# Patient Record
Sex: Female | Born: 1935 | Race: Black or African American | Hispanic: No | Smoking: Former smoker
Health system: Southern US, Community
[De-identification: ages and names within clinical notes are randomized; demographics above are authoritative.]

## PROBLEM LIST (undated history)

## (undated) DIAGNOSIS — D696 Thrombocytopenia, unspecified: Secondary | ICD-10-CM

## (undated) DIAGNOSIS — D693 Immune thrombocytopenic purpura: Secondary | ICD-10-CM

## (undated) DIAGNOSIS — E7800 Pure hypercholesterolemia, unspecified: Secondary | ICD-10-CM

## (undated) DIAGNOSIS — E079 Disorder of thyroid, unspecified: Secondary | ICD-10-CM

## (undated) DIAGNOSIS — D62 Acute posthemorrhagic anemia: Secondary | ICD-10-CM

## (undated) DIAGNOSIS — E876 Hypokalemia: Secondary | ICD-10-CM

## (undated) DIAGNOSIS — D72829 Elevated white blood cell count, unspecified: Secondary | ICD-10-CM

## (undated) DIAGNOSIS — M1990 Unspecified osteoarthritis, unspecified site: Secondary | ICD-10-CM

## (undated) DIAGNOSIS — I10 Essential (primary) hypertension: Secondary | ICD-10-CM

## (undated) DIAGNOSIS — C801 Malignant (primary) neoplasm, unspecified: Secondary | ICD-10-CM

## (undated) DIAGNOSIS — R509 Fever, unspecified: Secondary | ICD-10-CM

## (undated) DIAGNOSIS — D649 Anemia, unspecified: Secondary | ICD-10-CM

## (undated) DIAGNOSIS — F410 Panic disorder [episodic paroxysmal anxiety] without agoraphobia: Secondary | ICD-10-CM

## (undated) DIAGNOSIS — E039 Hypothyroidism, unspecified: Secondary | ICD-10-CM

## (undated) DIAGNOSIS — Z853 Personal history of malignant neoplasm of breast: Secondary | ICD-10-CM

## (undated) HISTORY — DX: Unspecified osteoarthritis, unspecified site: M19.90

## (undated) HISTORY — PX: JOINT REPLACEMENT: SHX530

## (undated) HISTORY — DX: Hypothyroidism, unspecified: E03.9

## (undated) HISTORY — DX: Hypokalemia: E87.6

## (undated) HISTORY — DX: Elevated white blood cell count, unspecified: D72.829

## (undated) HISTORY — DX: Immune thrombocytopenic purpura: D69.3

## (undated) HISTORY — DX: Acute posthemorrhagic anemia: D62

## (undated) HISTORY — PX: CHOLECYSTECTOMY: SHX55

## (undated) HISTORY — DX: Personal history of malignant neoplasm of breast: Z85.3

## (undated) HISTORY — PX: KNEE RECONSTRUCTION, MEDIAL PATELLAR FEMORAL LIGAMENT: SHX1898

## (undated) HISTORY — DX: Fever, unspecified: R50.9

## (undated) HISTORY — PX: OTHER SURGICAL HISTORY: SHX169

## (undated) HISTORY — PX: BREAST SURGERY: SHX581

## (undated) HISTORY — PX: BREAST LUMPECTOMY: SHX2

## (undated) HISTORY — DX: Pure hypercholesterolemia, unspecified: E78.00

---

## 1998-01-29 ENCOUNTER — Other Ambulatory Visit: Admission: RE | Admit: 1998-01-29 | Discharge: 1998-01-29 | Payer: Self-pay | Admitting: Family Medicine

## 1998-08-05 ENCOUNTER — Ambulatory Visit (HOSPITAL_COMMUNITY): Admission: RE | Admit: 1998-08-05 | Discharge: 1998-08-05 | Payer: Self-pay | Admitting: Family Medicine

## 1998-08-05 ENCOUNTER — Encounter: Payer: Self-pay | Admitting: Family Medicine

## 1998-08-17 ENCOUNTER — Inpatient Hospital Stay (HOSPITAL_COMMUNITY): Admission: EM | Admit: 1998-08-17 | Discharge: 1998-08-22 | Payer: Self-pay | Admitting: *Deleted

## 1998-08-17 ENCOUNTER — Encounter: Payer: Self-pay | Admitting: Emergency Medicine

## 1998-10-13 ENCOUNTER — Encounter: Payer: Self-pay | Admitting: Emergency Medicine

## 1998-10-14 ENCOUNTER — Inpatient Hospital Stay (HOSPITAL_COMMUNITY): Admission: EM | Admit: 1998-10-14 | Discharge: 1998-10-17 | Payer: Self-pay | Admitting: Emergency Medicine

## 1998-10-20 ENCOUNTER — Encounter: Admission: RE | Admit: 1998-10-20 | Discharge: 1999-01-18 | Payer: Self-pay | Admitting: Hematology & Oncology

## 1999-12-20 ENCOUNTER — Other Ambulatory Visit: Admission: RE | Admit: 1999-12-20 | Discharge: 1999-12-20 | Payer: Self-pay | Admitting: Family Medicine

## 2000-11-06 ENCOUNTER — Other Ambulatory Visit: Admission: RE | Admit: 2000-11-06 | Discharge: 2000-11-06 | Payer: Self-pay | Admitting: Family Medicine

## 2001-10-05 ENCOUNTER — Ambulatory Visit (HOSPITAL_COMMUNITY): Admission: RE | Admit: 2001-10-05 | Discharge: 2001-10-05 | Payer: Self-pay | Admitting: Internal Medicine

## 2001-10-05 ENCOUNTER — Encounter: Payer: Self-pay | Admitting: Internal Medicine

## 2001-10-19 ENCOUNTER — Encounter (HOSPITAL_BASED_OUTPATIENT_CLINIC_OR_DEPARTMENT_OTHER): Payer: Self-pay | Admitting: General Surgery

## 2001-10-23 ENCOUNTER — Inpatient Hospital Stay (HOSPITAL_COMMUNITY): Admission: RE | Admit: 2001-10-23 | Discharge: 2001-10-29 | Payer: Self-pay | Admitting: General Surgery

## 2001-10-23 ENCOUNTER — Encounter (INDEPENDENT_AMBULATORY_CARE_PROVIDER_SITE_OTHER): Payer: Self-pay | Admitting: Specialist

## 2003-04-07 ENCOUNTER — Other Ambulatory Visit: Admission: RE | Admit: 2003-04-07 | Discharge: 2003-04-07 | Payer: Self-pay | Admitting: Family Medicine

## 2003-11-05 ENCOUNTER — Encounter: Admission: RE | Admit: 2003-11-05 | Discharge: 2003-11-05 | Payer: Self-pay | Admitting: Family Medicine

## 2004-07-05 ENCOUNTER — Ambulatory Visit: Payer: Self-pay | Admitting: Family Medicine

## 2004-07-23 ENCOUNTER — Encounter (HOSPITAL_COMMUNITY): Admission: RE | Admit: 2004-07-23 | Discharge: 2004-10-21 | Payer: Self-pay | Admitting: General Surgery

## 2004-08-02 ENCOUNTER — Encounter: Admission: RE | Admit: 2004-08-02 | Discharge: 2004-08-02 | Payer: Self-pay | Admitting: General Surgery

## 2004-08-04 ENCOUNTER — Ambulatory Visit (HOSPITAL_COMMUNITY): Admission: RE | Admit: 2004-08-04 | Discharge: 2004-08-04 | Payer: Self-pay | Admitting: General Surgery

## 2004-08-04 ENCOUNTER — Ambulatory Visit (HOSPITAL_BASED_OUTPATIENT_CLINIC_OR_DEPARTMENT_OTHER): Admission: RE | Admit: 2004-08-04 | Discharge: 2004-08-04 | Payer: Self-pay | Admitting: General Surgery

## 2004-08-04 ENCOUNTER — Encounter (INDEPENDENT_AMBULATORY_CARE_PROVIDER_SITE_OTHER): Payer: Self-pay | Admitting: *Deleted

## 2004-09-08 ENCOUNTER — Ambulatory Visit: Payer: Self-pay | Admitting: Hematology & Oncology

## 2004-09-14 ENCOUNTER — Ambulatory Visit: Admission: RE | Admit: 2004-09-14 | Discharge: 2004-11-09 | Payer: Self-pay | Admitting: Radiation Oncology

## 2004-09-28 ENCOUNTER — Ambulatory Visit: Payer: Self-pay | Admitting: Family Medicine

## 2004-10-05 ENCOUNTER — Ambulatory Visit (HOSPITAL_COMMUNITY): Admission: RE | Admit: 2004-10-05 | Discharge: 2004-10-05 | Payer: Self-pay | Admitting: Family Medicine

## 2004-12-08 ENCOUNTER — Ambulatory Visit: Admission: RE | Admit: 2004-12-08 | Discharge: 2004-12-08 | Payer: Self-pay | Admitting: Radiation Oncology

## 2005-01-04 ENCOUNTER — Ambulatory Visit: Payer: Self-pay | Admitting: Hematology & Oncology

## 2005-04-06 ENCOUNTER — Ambulatory Visit: Payer: Self-pay | Admitting: Hematology & Oncology

## 2005-04-14 ENCOUNTER — Ambulatory Visit: Payer: Self-pay | Admitting: Family Medicine

## 2005-04-19 ENCOUNTER — Encounter: Admission: RE | Admit: 2005-04-19 | Discharge: 2005-04-19 | Payer: Self-pay | Admitting: Family Medicine

## 2005-05-20 ENCOUNTER — Ambulatory Visit: Payer: Self-pay | Admitting: Family Medicine

## 2005-06-13 ENCOUNTER — Ambulatory Visit: Payer: Self-pay | Admitting: Family Medicine

## 2005-07-06 ENCOUNTER — Ambulatory Visit: Payer: Self-pay | Admitting: Hematology & Oncology

## 2005-07-19 ENCOUNTER — Ambulatory Visit: Payer: Self-pay | Admitting: Family Medicine

## 2005-08-15 ENCOUNTER — Ambulatory Visit: Payer: Self-pay | Admitting: Family Medicine

## 2005-10-10 ENCOUNTER — Ambulatory Visit: Payer: Self-pay | Admitting: Family Medicine

## 2005-11-29 ENCOUNTER — Ambulatory Visit: Payer: Self-pay | Admitting: Family Medicine

## 2006-01-03 ENCOUNTER — Ambulatory Visit: Payer: Self-pay | Admitting: Hematology & Oncology

## 2006-01-05 LAB — COMPREHENSIVE METABOLIC PANEL WITH GFR
ALT: 11 U/L (ref 0–40)
AST: 25 U/L (ref 0–37)
Albumin: 3.9 g/dL (ref 3.5–5.2)
Alkaline Phosphatase: 50 U/L (ref 39–117)
BUN: 19 mg/dL (ref 6–23)
CO2: 30 meq/L (ref 19–32)
Calcium: 10.1 mg/dL (ref 8.4–10.5)
Chloride: 100 meq/L (ref 96–112)
Creatinine, Ser: 1.1 mg/dL (ref 0.4–1.2)
Glucose, Bld: 111 mg/dL — ABNORMAL HIGH (ref 70–99)
Potassium: 5.1 meq/L (ref 3.5–5.3)
Sodium: 141 meq/L (ref 135–145)
Total Bilirubin: 0.5 mg/dL (ref 0.3–1.2)
Total Protein: 7 g/dL (ref 6.0–8.3)

## 2006-01-05 LAB — CBC WITH DIFFERENTIAL/PLATELET
BASO%: 0.8 % (ref 0.0–2.0)
Basophils Absolute: 0 10*3/uL (ref 0.0–0.1)
EOS%: 13.7 % — ABNORMAL HIGH (ref 0.0–7.0)
HCT: 35.6 % (ref 34.8–46.6)
HGB: 11.9 g/dL (ref 11.6–15.9)
LYMPH%: 27.9 % (ref 14.0–48.0)
MCH: 31.3 pg (ref 26.0–34.0)
MCHC: 33.4 g/dL (ref 32.0–36.0)
MCV: 93.5 fL (ref 81.0–101.0)
MONO%: 6.1 % (ref 0.0–13.0)
NEUT%: 51.5 % (ref 39.6–76.8)
lymph#: 1.3 10*3/uL (ref 0.9–3.3)

## 2006-01-05 LAB — CEA: CEA: 1.5 ng/mL (ref 0.0–5.0)

## 2006-01-10 ENCOUNTER — Ambulatory Visit: Payer: Self-pay | Admitting: Family Medicine

## 2006-01-20 ENCOUNTER — Ambulatory Visit: Payer: Self-pay | Admitting: Family Medicine

## 2006-03-16 ENCOUNTER — Ambulatory Visit: Payer: Self-pay | Admitting: Family Medicine

## 2006-04-05 ENCOUNTER — Ambulatory Visit: Payer: Self-pay | Admitting: Nurse Practitioner

## 2006-06-09 ENCOUNTER — Ambulatory Visit: Payer: Self-pay | Admitting: Internal Medicine

## 2006-06-21 ENCOUNTER — Ambulatory Visit (HOSPITAL_COMMUNITY): Admission: RE | Admit: 2006-06-21 | Discharge: 2006-06-21 | Payer: Self-pay | Admitting: Family Medicine

## 2006-06-29 ENCOUNTER — Ambulatory Visit: Payer: Self-pay | Admitting: Family Medicine

## 2006-07-04 ENCOUNTER — Ambulatory Visit (HOSPITAL_COMMUNITY): Admission: RE | Admit: 2006-07-04 | Discharge: 2006-07-04 | Payer: Self-pay | Admitting: Family Medicine

## 2006-07-04 ENCOUNTER — Ambulatory Visit: Payer: Self-pay | Admitting: Hematology & Oncology

## 2006-07-06 LAB — CBC WITH DIFFERENTIAL/PLATELET
BASO%: 1.1 % (ref 0.0–2.0)
EOS%: 9.5 % — ABNORMAL HIGH (ref 0.0–7.0)
MCH: 31.7 pg (ref 26.0–34.0)
MCHC: 33.7 g/dL (ref 32.0–36.0)
MCV: 94.1 fL (ref 81.0–101.0)
MONO%: 5.5 % (ref 0.0–13.0)
NEUT%: 52.3 % (ref 39.6–76.8)
RDW: 13.3 % (ref 11.3–14.5)
lymph#: 1.6 10*3/uL (ref 0.9–3.3)

## 2006-07-06 LAB — COMPREHENSIVE METABOLIC PANEL
ALT: 17 U/L (ref 0–35)
AST: 21 U/L (ref 0–37)
Alkaline Phosphatase: 54 U/L (ref 39–117)
BUN: 16 mg/dL (ref 6–23)
Calcium: 10.6 mg/dL — ABNORMAL HIGH (ref 8.4–10.5)
Chloride: 99 mEq/L (ref 96–112)
Creatinine, Ser: 0.98 mg/dL (ref 0.40–1.20)
Potassium: 4.3 mEq/L (ref 3.5–5.3)

## 2006-08-02 ENCOUNTER — Ambulatory Visit: Payer: Self-pay | Admitting: Family Medicine

## 2006-08-09 ENCOUNTER — Ambulatory Visit: Payer: Self-pay | Admitting: Family Medicine

## 2006-08-17 ENCOUNTER — Ambulatory Visit: Payer: Self-pay | Admitting: Family Medicine

## 2006-09-12 ENCOUNTER — Ambulatory Visit: Payer: Self-pay | Admitting: Family Medicine

## 2006-10-03 ENCOUNTER — Ambulatory Visit: Payer: Self-pay | Admitting: Family Medicine

## 2006-10-25 ENCOUNTER — Ambulatory Visit: Payer: Self-pay | Admitting: Internal Medicine

## 2006-11-08 ENCOUNTER — Ambulatory Visit: Payer: Self-pay | Admitting: Internal Medicine

## 2006-12-21 ENCOUNTER — Ambulatory Visit: Payer: Self-pay | Admitting: Family Medicine

## 2006-12-28 ENCOUNTER — Ambulatory Visit: Payer: Self-pay | Admitting: Family Medicine

## 2006-12-28 ENCOUNTER — Encounter (INDEPENDENT_AMBULATORY_CARE_PROVIDER_SITE_OTHER): Payer: Self-pay | Admitting: Family Medicine

## 2006-12-28 ENCOUNTER — Other Ambulatory Visit: Admission: RE | Admit: 2006-12-28 | Discharge: 2006-12-28 | Payer: Self-pay | Admitting: Family Medicine

## 2006-12-28 LAB — CONVERTED CEMR LAB: Pap Smear: NORMAL

## 2007-01-02 ENCOUNTER — Ambulatory Visit: Payer: Self-pay | Admitting: Hematology & Oncology

## 2007-01-04 LAB — COMPREHENSIVE METABOLIC PANEL
ALT: 20 U/L (ref 0–35)
AST: 21 U/L (ref 0–37)
Albumin: 4.3 g/dL (ref 3.5–5.2)
Alkaline Phosphatase: 51 U/L (ref 39–117)
Glucose, Bld: 123 mg/dL — ABNORMAL HIGH (ref 70–99)
Potassium: 4.2 mEq/L (ref 3.5–5.3)
Sodium: 141 mEq/L (ref 135–145)
Total Protein: 7.2 g/dL (ref 6.0–8.3)

## 2007-01-04 LAB — CBC WITH DIFFERENTIAL/PLATELET
BASO%: 0.6 % (ref 0.0–2.0)
EOS%: 6 % (ref 0.0–7.0)
Eosinophils Absolute: 0.3 10*3/uL (ref 0.0–0.5)
MCHC: 34.6 g/dL (ref 32.0–36.0)
MCV: 92.4 fL (ref 81.0–101.0)
MONO%: 5.9 % (ref 0.0–13.0)
NEUT#: 2.5 10*3/uL (ref 1.5–6.5)
RBC: 3.66 10*6/uL — ABNORMAL LOW (ref 3.70–5.32)
RDW: 12.9 % (ref 11.3–14.5)

## 2007-01-09 ENCOUNTER — Ambulatory Visit: Payer: Self-pay | Admitting: Family Medicine

## 2007-01-23 ENCOUNTER — Ambulatory Visit: Payer: Self-pay | Admitting: Family Medicine

## 2007-02-02 ENCOUNTER — Ambulatory Visit: Payer: Self-pay | Admitting: Family Medicine

## 2007-02-07 ENCOUNTER — Encounter (INDEPENDENT_AMBULATORY_CARE_PROVIDER_SITE_OTHER): Payer: Self-pay | Admitting: Family Medicine

## 2007-02-07 DIAGNOSIS — E039 Hypothyroidism, unspecified: Secondary | ICD-10-CM | POA: Insufficient documentation

## 2007-02-07 DIAGNOSIS — C50919 Malignant neoplasm of unspecified site of unspecified female breast: Secondary | ICD-10-CM | POA: Insufficient documentation

## 2007-02-07 DIAGNOSIS — IMO0002 Reserved for concepts with insufficient information to code with codable children: Secondary | ICD-10-CM

## 2007-02-07 DIAGNOSIS — I10 Essential (primary) hypertension: Secondary | ICD-10-CM

## 2007-02-09 ENCOUNTER — Inpatient Hospital Stay (HOSPITAL_COMMUNITY): Admission: RE | Admit: 2007-02-09 | Discharge: 2007-02-12 | Payer: Self-pay | Admitting: Orthopedic Surgery

## 2007-03-07 ENCOUNTER — Encounter: Admission: RE | Admit: 2007-03-07 | Discharge: 2007-03-07 | Payer: Self-pay | Admitting: Orthopedic Surgery

## 2007-03-13 ENCOUNTER — Encounter: Admission: RE | Admit: 2007-03-13 | Discharge: 2007-05-11 | Payer: Self-pay | Admitting: Orthopedic Surgery

## 2007-04-11 ENCOUNTER — Ambulatory Visit: Payer: Self-pay | Admitting: Family Medicine

## 2007-04-25 ENCOUNTER — Ambulatory Visit: Payer: Self-pay | Admitting: Family Medicine

## 2007-05-02 ENCOUNTER — Encounter: Admission: RE | Admit: 2007-05-02 | Discharge: 2007-07-31 | Payer: Self-pay | Admitting: Family Medicine

## 2007-07-03 ENCOUNTER — Ambulatory Visit: Payer: Self-pay | Admitting: Hematology & Oncology

## 2007-07-05 LAB — CBC WITH DIFFERENTIAL/PLATELET
BASO%: 0.7 % (ref 0.0–2.0)
HCT: 34.7 % — ABNORMAL LOW (ref 34.8–46.6)
LYMPH%: 26 % (ref 14.0–48.0)
MCHC: 34 g/dL (ref 32.0–36.0)
MCV: 91.8 fL (ref 81.0–101.0)
MONO%: 5.4 % (ref 0.0–13.0)
NEUT%: 60.3 % (ref 39.6–76.8)
Platelets: 288 10*3/uL (ref 145–400)
RBC: 3.77 10*6/uL (ref 3.70–5.32)

## 2007-12-31 ENCOUNTER — Ambulatory Visit: Payer: Self-pay | Admitting: Hematology & Oncology

## 2008-01-01 ENCOUNTER — Encounter (INDEPENDENT_AMBULATORY_CARE_PROVIDER_SITE_OTHER): Payer: Self-pay | Admitting: Family Medicine

## 2008-01-01 ENCOUNTER — Ambulatory Visit: Payer: Self-pay | Admitting: Internal Medicine

## 2008-01-02 LAB — CBC WITH DIFFERENTIAL/PLATELET
BASO%: 0.7 % (ref 0.0–2.0)
EOS%: 5.9 % (ref 0.0–7.0)
HGB: 12.2 g/dL (ref 11.6–15.9)
MCH: 31 pg (ref 26.0–34.0)
MCHC: 33.6 g/dL (ref 32.0–36.0)
RBC: 3.93 10*6/uL (ref 3.70–5.32)
RDW: 12.9 % (ref 11.3–14.5)
lymph#: 1 10*3/uL (ref 0.9–3.3)

## 2008-01-02 LAB — COMPREHENSIVE METABOLIC PANEL
ALT: 16 U/L (ref 0–35)
AST: 21 U/L (ref 0–37)
Albumin: 4.5 g/dL (ref 3.5–5.2)
Alkaline Phosphatase: 61 U/L (ref 39–117)
Calcium: 10.4 mg/dL (ref 8.4–10.5)
Chloride: 101 mEq/L (ref 96–112)
Potassium: 4.1 mEq/L (ref 3.5–5.3)
Sodium: 142 mEq/L (ref 135–145)

## 2008-01-30 ENCOUNTER — Encounter (INDEPENDENT_AMBULATORY_CARE_PROVIDER_SITE_OTHER): Payer: Self-pay | Admitting: Family Medicine

## 2008-01-30 ENCOUNTER — Ambulatory Visit: Payer: Self-pay | Admitting: Internal Medicine

## 2008-01-30 LAB — CONVERTED CEMR LAB
AST: 19 units/L (ref 0–37)
Albumin: 4.4 g/dL (ref 3.5–5.2)
Alkaline Phosphatase: 56 units/L (ref 39–117)
Basophils Relative: 1 % (ref 0–1)
Eosinophils Absolute: 0.4 10*3/uL (ref 0.0–0.7)
Eosinophils Relative: 7 % — ABNORMAL HIGH (ref 0–5)
Glucose, Bld: 98 mg/dL (ref 70–99)
HCT: 37.5 % (ref 36.0–46.0)
HDL: 74 mg/dL (ref >39)
LDL Cholesterol: 66 mg/dL (ref 0–99)
Lymphs Abs: 2.1 10*3/uL (ref 0.7–4.0)
MCHC: 31.7 g/dL (ref 30.0–36.0)
MCV: 96.6 fL (ref 78.0–100.0)
Neutrophils Relative %: 50 % (ref 43–77)
Platelets: 287 10*3/uL (ref 150–400)
Potassium: 4.6 meq/L (ref 3.5–5.3)
RDW: 13.2 % (ref 11.5–15.5)
Sodium: 140 meq/L (ref 135–145)
TSH: 3.587 microintl units/mL (ref 0.350–5.50)
Total Bilirubin: 0.5 mg/dL (ref 0.3–1.2)
Total Protein: 7.5 g/dL (ref 6.0–8.3)
Triglycerides: 66 mg/dL (ref <150)
VLDL: 13 mg/dL (ref 0–40)
WBC: 5.7 10*3/uL (ref 4.0–10.5)

## 2008-02-07 ENCOUNTER — Ambulatory Visit: Payer: Self-pay | Admitting: Internal Medicine

## 2008-02-14 ENCOUNTER — Ambulatory Visit: Payer: Self-pay | Admitting: Internal Medicine

## 2008-02-19 ENCOUNTER — Ambulatory Visit: Payer: Self-pay | Admitting: Internal Medicine

## 2008-06-11 ENCOUNTER — Emergency Department (HOSPITAL_COMMUNITY): Admission: EM | Admit: 2008-06-11 | Discharge: 2008-06-11 | Payer: Self-pay | Admitting: Family Medicine

## 2008-06-17 ENCOUNTER — Ambulatory Visit: Payer: Self-pay | Admitting: Family Medicine

## 2008-07-03 ENCOUNTER — Ambulatory Visit: Payer: Self-pay | Admitting: Internal Medicine

## 2008-07-03 ENCOUNTER — Ambulatory Visit: Payer: Self-pay | Admitting: Hematology & Oncology

## 2008-07-04 LAB — CBC WITH DIFFERENTIAL (CANCER CENTER ONLY)
Eosinophils Absolute: 0.3 10*3/uL (ref 0.0–0.5)
HCT: 34 % — ABNORMAL LOW (ref 34.8–46.6)
LYMPH%: 28.6 % (ref 14.0–48.0)
MCV: 91 fL (ref 81–101)
MONO#: 0.3 10*3/uL (ref 0.1–0.9)
Platelets: 277 10*3/uL (ref 145–400)
RBC: 3.72 10*6/uL (ref 3.70–5.32)
WBC: 4.6 10*3/uL (ref 3.9–10.0)

## 2008-07-04 LAB — COMPREHENSIVE METABOLIC PANEL
AST: 20 U/L (ref 0–37)
Albumin: 4.3 g/dL (ref 3.5–5.2)
BUN: 18 mg/dL (ref 6–23)
Calcium: 10.6 mg/dL — ABNORMAL HIGH (ref 8.4–10.5)
Chloride: 99 mEq/L (ref 96–112)
Potassium: 4.5 mEq/L (ref 3.5–5.3)
Total Protein: 7.3 g/dL (ref 6.0–8.3)

## 2008-10-28 ENCOUNTER — Ambulatory Visit: Payer: Self-pay | Admitting: Internal Medicine

## 2008-10-28 ENCOUNTER — Encounter (INDEPENDENT_AMBULATORY_CARE_PROVIDER_SITE_OTHER): Payer: Self-pay | Admitting: Family Medicine

## 2008-10-28 LAB — CONVERTED CEMR LAB
BUN: 15 mg/dL (ref 6–23)
Creatinine, Ser: 1.1 mg/dL (ref 0.40–1.20)

## 2008-10-30 ENCOUNTER — Ambulatory Visit (HOSPITAL_COMMUNITY): Admission: RE | Admit: 2008-10-30 | Discharge: 2008-10-30 | Payer: Self-pay | Admitting: Dentistry

## 2008-11-27 ENCOUNTER — Ambulatory Visit: Payer: Self-pay | Admitting: Family Medicine

## 2009-02-05 ENCOUNTER — Ambulatory Visit: Payer: Self-pay | Admitting: Family Medicine

## 2009-02-05 LAB — CONVERTED CEMR LAB
Chloride: 101 meq/L (ref 96–112)
Cholesterol: 142 mg/dL (ref 0–200)
HDL: 66 mg/dL (ref >39)
LDL Cholesterol: 63 mg/dL (ref 0–99)
Potassium: 4.5 meq/L (ref 3.5–5.3)
Sodium: 142 meq/L (ref 135–145)
Total CHOL/HDL Ratio: 2.2
Triglycerides: 66 mg/dL (ref <150)
VLDL: 13 mg/dL (ref 0–40)

## 2009-02-10 ENCOUNTER — Ambulatory Visit: Payer: Self-pay | Admitting: Hematology & Oncology

## 2009-02-11 LAB — CBC WITH DIFFERENTIAL (CANCER CENTER ONLY)
Eosinophils Absolute: 0.3 10*3/uL (ref 0.0–0.5)
LYMPH#: 1.9 10*3/uL (ref 0.9–3.3)
MONO#: 0.2 10*3/uL (ref 0.1–0.9)
MONO%: 4.8 % (ref 0.0–13.0)
NEUT#: 2.5 10*3/uL (ref 1.5–6.5)
Platelets: 232 10*3/uL (ref 145–400)
RBC: 3.2 10*6/uL — ABNORMAL LOW (ref 3.70–5.32)
WBC: 4.9 10*3/uL (ref 3.9–10.0)

## 2009-02-11 LAB — CHCC SATELLITE - SMEAR

## 2009-02-12 LAB — COMPREHENSIVE METABOLIC PANEL
ALT: 15 U/L (ref 0–35)
AST: 20 U/L (ref 0–37)
Albumin: 4.4 g/dL (ref 3.5–5.2)
Calcium: 10.3 mg/dL (ref 8.4–10.5)
Chloride: 98 mEq/L (ref 96–112)
Creatinine, Ser: 1.3 mg/dL — ABNORMAL HIGH (ref 0.40–1.20)
Potassium: 4.5 mEq/L (ref 3.5–5.3)

## 2009-02-12 LAB — ERYTHROPOIETIN: Erythropoietin: 16.8 m[IU]/mL (ref 2.6–34.0)

## 2009-03-27 ENCOUNTER — Ambulatory Visit: Payer: Self-pay | Admitting: Hematology & Oncology

## 2009-03-30 LAB — CBC WITH DIFFERENTIAL (CANCER CENTER ONLY)
BASO%: 0.5 % (ref 0.0–2.0)
EOS%: 4.9 % (ref 0.0–7.0)
HCT: 34.5 % — ABNORMAL LOW (ref 34.8–46.6)
LYMPH#: 1.7 10*3/uL (ref 0.9–3.3)
MCHC: 32.3 g/dL (ref 32.0–36.0)
MONO#: 0.3 10*3/uL (ref 0.1–0.9)
NEUT#: 2.5 10*3/uL (ref 1.5–6.5)
NEUT%: 53.2 % (ref 39.6–80.0)
RDW: 10.6 % (ref 10.5–14.6)
WBC: 4.7 10*3/uL (ref 3.9–10.0)

## 2009-03-30 LAB — CHCC SATELLITE - SMEAR

## 2009-04-01 LAB — SPEP & IFE WITH QIG
Albumin ELP: 57.9 % (ref 55.8–66.1)
Alpha-1-Globulin: 5.3 % — ABNORMAL HIGH (ref 2.9–4.9)
IgM, Serum: 133 mg/dL (ref 60–263)
Total Protein, Serum Electrophoresis: 6.8 g/dL (ref 6.0–8.3)

## 2009-04-01 LAB — RETICULOCYTES (CHCC)
ABS Retic: 63.8 10*3/uL (ref 19.0–186.0)
RBC.: 3.75 MIL/uL — ABNORMAL LOW (ref 3.87–5.11)

## 2009-06-25 ENCOUNTER — Ambulatory Visit: Payer: Self-pay | Admitting: Hematology & Oncology

## 2009-06-29 LAB — CBC WITH DIFFERENTIAL (CANCER CENTER ONLY)
BASO%: 0.4 % (ref 0.0–2.0)
EOS%: 5.4 % (ref 0.0–7.0)
LYMPH#: 1.8 10*3/uL (ref 0.9–3.3)
LYMPH%: 36.3 % (ref 14.0–48.0)
MCHC: 34 g/dL (ref 32.0–36.0)
MCV: 91 fL (ref 81–101)
MONO#: 0.2 10*3/uL (ref 0.1–0.9)
NEUT%: 53.3 % (ref 39.6–80.0)
Platelets: 243 10*3/uL (ref 145–400)
RDW: 11.2 % (ref 10.5–14.6)
WBC: 4.9 10*3/uL (ref 3.9–10.0)

## 2009-06-29 LAB — CHCC SATELLITE - SMEAR

## 2009-07-09 ENCOUNTER — Ambulatory Visit: Payer: Self-pay | Admitting: Family Medicine

## 2009-09-25 ENCOUNTER — Emergency Department (HOSPITAL_COMMUNITY): Admission: EM | Admit: 2009-09-25 | Discharge: 2009-09-25 | Payer: Self-pay | Admitting: Emergency Medicine

## 2009-09-25 ENCOUNTER — Ambulatory Visit: Payer: Self-pay | Admitting: Hematology & Oncology

## 2009-10-07 ENCOUNTER — Emergency Department (HOSPITAL_COMMUNITY): Admission: EM | Admit: 2009-10-07 | Discharge: 2009-10-07 | Payer: Self-pay | Admitting: Emergency Medicine

## 2009-10-13 ENCOUNTER — Ambulatory Visit: Payer: Self-pay | Admitting: Family Medicine

## 2009-10-13 LAB — CONVERTED CEMR LAB
BUN: 15 mg/dL (ref 6–23)
CO2: 26 meq/L (ref 19–32)
Calcium: 10.6 mg/dL — ABNORMAL HIGH (ref 8.4–10.5)
Creatinine, Ser: 0.97 mg/dL (ref 0.40–1.20)
Free T4: 1.35 ng/dL (ref 0.80–1.80)
Glucose, Bld: 93 mg/dL (ref 70–99)
TSH: 0.853 microintl units/mL (ref 0.350–4.500)
Total CHOL/HDL Ratio: 2.5

## 2009-11-24 ENCOUNTER — Ambulatory Visit: Payer: Self-pay | Admitting: Family Medicine

## 2010-01-06 ENCOUNTER — Ambulatory Visit: Payer: Self-pay | Admitting: Hematology & Oncology

## 2010-01-07 LAB — CBC WITH DIFFERENTIAL (CANCER CENTER ONLY)
BASO%: 0.5 % (ref 0.0–2.0)
HCT: 36.9 % (ref 34.8–46.6)
LYMPH#: 1.5 10*3/uL (ref 0.9–3.3)
MONO#: 0.2 10*3/uL (ref 0.1–0.9)
Platelets: 272 10*3/uL (ref 145–400)
RDW: 11.2 % (ref 10.5–14.6)
WBC: 4.3 10*3/uL (ref 3.9–10.0)

## 2010-01-07 LAB — COMPREHENSIVE METABOLIC PANEL
ALT: 21 U/L (ref 0–35)
CO2: 28 mEq/L (ref 19–32)
Calcium: 10.5 mg/dL (ref 8.4–10.5)
Chloride: 99 mEq/L (ref 96–112)
Sodium: 141 mEq/L (ref 135–145)
Total Protein: 7.2 g/dL (ref 6.0–8.3)

## 2010-01-07 LAB — RETICULOCYTES (CHCC): Retic Ct Pct: 1.9 % (ref 0.4–3.1)

## 2010-01-07 LAB — FERRITIN: Ferritin: 80 ng/mL (ref 10–291)

## 2010-01-12 ENCOUNTER — Ambulatory Visit: Payer: Self-pay | Admitting: Family Medicine

## 2010-02-18 ENCOUNTER — Ambulatory Visit: Payer: Self-pay | Admitting: Family Medicine

## 2010-03-05 ENCOUNTER — Ambulatory Visit: Payer: Self-pay | Admitting: Internal Medicine

## 2010-06-08 ENCOUNTER — Encounter (INDEPENDENT_AMBULATORY_CARE_PROVIDER_SITE_OTHER): Payer: Self-pay | Admitting: Family Medicine

## 2010-06-29 ENCOUNTER — Encounter (INDEPENDENT_AMBULATORY_CARE_PROVIDER_SITE_OTHER): Payer: Self-pay | Admitting: Family Medicine

## 2010-06-29 LAB — CONVERTED CEMR LAB
ALT: 16 units/L (ref 0–35)
AST: 23 units/L (ref 0–37)
Calcium: 10.4 mg/dL (ref 8.4–10.5)
Chloride: 96 meq/L (ref 96–112)
Creatinine, Ser: 1.19 mg/dL (ref 0.40–1.20)
Microalb, Ur: 1.96 mg/dL — ABNORMAL HIGH (ref 0.00–1.89)
Potassium: 4.2 meq/L (ref 3.5–5.3)
Sodium: 141 meq/L (ref 135–145)
Total CHOL/HDL Ratio: 3
Total Protein: 7.6 g/dL (ref 6.0–8.3)

## 2010-07-02 ENCOUNTER — Ambulatory Visit (HOSPITAL_COMMUNITY): Admission: RE | Admit: 2010-07-02 | Discharge: 2010-07-02 | Payer: Self-pay | Admitting: Family Medicine

## 2010-07-06 ENCOUNTER — Ambulatory Visit: Payer: Self-pay | Admitting: Hematology & Oncology

## 2010-07-09 LAB — CBC WITH DIFFERENTIAL (CANCER CENTER ONLY)
Eosinophils Absolute: 0.3 10*3/uL (ref 0.0–0.5)
LYMPH#: 2 10*3/uL (ref 0.9–3.3)
MONO#: 0.3 10*3/uL (ref 0.1–0.9)
NEUT#: 2.3 10*3/uL (ref 1.5–6.5)
Platelets: 280 10*3/uL (ref 145–400)
RBC: 4.01 10*6/uL (ref 3.70–5.32)
WBC: 4.8 10*3/uL (ref 3.9–10.0)

## 2010-07-09 LAB — CHCC SATELLITE - SMEAR

## 2010-07-10 LAB — COMPREHENSIVE METABOLIC PANEL
Albumin: 4.5 g/dL (ref 3.5–5.2)
Alkaline Phosphatase: 55 U/L (ref 39–117)
BUN: 31 mg/dL — ABNORMAL HIGH (ref 6–23)
CO2: 33 mEq/L — ABNORMAL HIGH (ref 19–32)
Chloride: 98 mEq/L (ref 96–112)
Creatinine, Ser: 1.35 mg/dL — ABNORMAL HIGH (ref 0.40–1.20)
Sodium: 140 mEq/L (ref 135–145)
Total Bilirubin: 0.4 mg/dL (ref 0.3–1.2)

## 2010-07-10 LAB — VITAMIN D 25 HYDROXY (VIT D DEFICIENCY, FRACTURES): Vit D, 25-Hydroxy: 38 ng/mL (ref 30–89)

## 2010-09-18 ENCOUNTER — Emergency Department (HOSPITAL_COMMUNITY)
Admission: EM | Admit: 2010-09-18 | Discharge: 2010-09-18 | Payer: Self-pay | Source: Home / Self Care | Admitting: Emergency Medicine

## 2010-09-21 LAB — URINALYSIS, ROUTINE W REFLEX MICROSCOPIC
Bilirubin Urine: NEGATIVE
Specific Gravity, Urine: 1.009 (ref 1.005–1.030)
Urine Glucose, Fasting: NEGATIVE mg/dL
Urobilinogen, UA: 0.2 mg/dL (ref 0.0–1.0)
pH: 7 (ref 5.0–8.0)

## 2010-09-21 LAB — URINE MICROSCOPIC-ADD ON

## 2010-11-01 ENCOUNTER — Encounter (INDEPENDENT_AMBULATORY_CARE_PROVIDER_SITE_OTHER): Payer: Self-pay | Admitting: Family Medicine

## 2010-11-01 LAB — CONVERTED CEMR LAB
AST: 21 units/L (ref 0–37)
Albumin: 4.6 g/dL (ref 3.5–5.2)
BUN: 20 mg/dL (ref 6–23)
CO2: 31 meq/L (ref 19–32)
Calcium: 10.2 mg/dL (ref 8.4–10.5)
Chloride: 100 meq/L (ref 96–112)
Cholesterol: 209 mg/dL — ABNORMAL HIGH (ref 0–200)
HDL: 65 mg/dL (ref >39)
Potassium: 4.4 meq/L (ref 3.5–5.3)

## 2010-11-14 LAB — BASIC METABOLIC PANEL
CO2: 32 mEq/L (ref 19–32)
Chloride: 100 mEq/L (ref 96–112)
Creatinine, Ser: 1.29 mg/dL — ABNORMAL HIGH (ref 0.4–1.2)
GFR calc Af Amer: 49 mL/min — ABNORMAL LOW (ref >60)
Sodium: 141 mEq/L (ref 135–145)

## 2010-11-14 LAB — URINALYSIS, ROUTINE W REFLEX MICROSCOPIC
Glucose, UA: NEGATIVE mg/dL
Ketones, ur: NEGATIVE mg/dL
Protein, ur: 30 mg/dL — AB
Urobilinogen, UA: 0.2 mg/dL (ref 0.0–1.0)

## 2010-11-14 LAB — DIFFERENTIAL
Basophils Absolute: 0 10*3/uL (ref 0.0–0.1)
Basophils Relative: 1 % (ref 0–1)
Eosinophils Absolute: 0.2 10*3/uL (ref 0.0–0.7)
Eosinophils Relative: 4 % (ref 0–5)
Lymphs Abs: 1.4 10*3/uL (ref 0.7–4.0)

## 2010-11-14 LAB — CBC
Hemoglobin: 11.5 g/dL — ABNORMAL LOW (ref 12.0–15.0)
MCHC: 33 g/dL (ref 30.0–36.0)
MCV: 95.7 fL (ref 78.0–100.0)
RBC: 3.64 MIL/uL — ABNORMAL LOW (ref 3.87–5.11)

## 2010-11-14 LAB — URINE MICROSCOPIC-ADD ON

## 2011-01-11 NOTE — Discharge Summary (Signed)
NAMEWALDA, HERTZOG               ACCOUNT NO.:  [id number]   MEDICAL RECORD NO.:  [id number]          PATIENT TYPE:  INP   LOCATION:  5003                         FACILITY:  [facility]   PHYSICIAN:  Dyke Brackett, M.D.    DATE OF BIRTH:  [birth year]   DATE OF ADMISSION:  02/09/2007  DATE OF DISCHARGE:  02/12/2007                               DISCHARGE SUMMARY   ADMITTING DIAGNOSES:  1. Osteoarthritis of left knee.  2. Hypertension.  3. Hypothyroidism.  4. Hypercholesterolemia.  5. A history of breast cancer.  6. Allergies.  7. A questionable history of idiopathic thrombocytopenic purpura.   DISCHARGE DIAGNOSES:  1. Status post a left total knee arthroplasty.  2. Acute blood loss anemia secondary to surgery, requiring blood      transfusion.  3. Hypokalemia, treated.  4. Leukocytosis/pyrexia.  5. Hypertension.  6. Hypothyroidism.  7. Hypercholesterolemia.  8. A history of breast cancer.  9. Allergies.  10.A questionable history of idiopathic thrombocytopenic purpura.   HPI:  Lisa Sawyer is a 77 year old female who presents with a 5-year  history of gradual progressive onset of left knee pain.  No injury, no  surgical intervention.  Pain is intermittent, a sharp achy pain in her  joint with radiation into tibia.  Pain increases with weather and  decreases with sitting.  Tylenol Extra Strength provides moderate  relief.  She does have night pain.  Cortisone injections have offered  relief in the past.  She has used a four-prong cane for the past 6  months.   ALLERGIES:  NAPROXEN.   MEDS:  1. Lisinopril/HCTZ 20/25 one daily.  2. Lipitor 20 mg 1 daily.  3. Synthroid 0.1 mg daily.  4. Climara 2.5 mg 1 daily.  5. Clarinex 5 mg daily.  6. Nasonex 50 mcg q.h.s.  7. Fish oil 1200 mg 1 daily.  8. Aspirin 81 mg 1 daily, last taken January 31, 2007.  9. Caltrate +D, magnesium 2 daily.  10.Tylenol p.r.n.  11.Centrum Silver 1 daily.  12.Amlodipine 5 mg 1 daily.   SURGICAL PROCEDURE:   Patient was taken to the operating room on February 09, 2007 by Dr. Frederico Hamman assisted by Richardean Canal, PA-C.  The patient  was placed under general anesthesia and underwent a left total knee PC3.  Patient did have supplemental femoral nerve block.   Following components were used:  1. An Oval Dome 3-Peg patella.  2. A TC3 insert, size 3, 17.5 mm.  3. Femoral component PC3, 6 mL/61 mm anterior and posterior.  4. Universal fluted stems at 14 mm.  5. Revision symmetry , size 3.  6. TC3 insert, size 3, 17.5 mm.   Patient tolerated procedure well and returned to recovery in good/stable  condition.   HOSPITAL CONSULTS:  PT/OT, case management.   HOSPITAL COURSE:  Postop day 1:  The patient afebrile, blood pressure  160/74, O2 saturations 100% on nasal cannula.  H&H was 8.2 and 24.7.  Potassium was 3.2.  The patient was transfused 2 units of packed red  blood cells, TEDs were placed.  Patient denied shortness  of breath,  chest pain, no nausea.   Postop day 2:  The patient with nausea, no chest pain or shortness of  breath.  Pain under control with weakness.  T max was 102.7, peak  current was 98.8 at rounds.  Her blood pressure 143/77.  Labs was  pending at the time of rounds.  Patient did not receive her blood  secondary to fever and, therefore, was getting packed red blood cells on  June 15 on postop day 2.  Labs to be checked later that day.   Postop day 3:  The patient doing well, good pain control, some weakness  with physical therapy.  No chest pain, shortness of breath, nausea,  vomiting.  Denied dysuria.  No bowel movement, positive voiding,  positive flatus.  Temp was 99.2, vital signs stable.  H&H was 12.5 and  37.8.  Lowden count was 14.3, and this was up to 12.1 the day prior.  On  June 15, postop day 2, potassium was 7.1.  Patient was to work with  physical therapy, and if she had done well, she was to be discharged.  CBC was to be checked on June 18 for Carollo count  impression related to  blood transfusion.  The patient did not have a bowel movement.  The  patient afebrile.  Chest x-ray and UA had been checked on June 14 and  were  negative.  The patient was later discharged to home that day in  good/stable condition.   LABS:  Routine labs on admission:  CBC:  All values within normal  limits.  Coags were within normal limits, and her routine chemistries  were within normal limits.  Urinalysis was negative on admission.  Hepatic enzymes were within normal limits on admission.   Urine culture on February 06, 2007 was negative.   X-rays left knee, portable, performed on February 09, 2007 showed left total  knee replacement without complicated features.   Portable chest x-ray on February 10, 2007 showed hyperinflation, borderline  heart size with no active disease.   Left knee portable performed on February 09, 2007 showed total knee  replacement, drain removed, no retained foreign body.   DISCHARGE INSTRUCTIONS/MEDS:  1. Lovenox 40 mg 1 injection daily in the a.m.  2. No aspirin while on Lovenox.  3. Percocet 5/325 one to two tablets every 4 to 6 hours as needed.  4. Patient is to resume aspirin on February 24, 2007 at 81 mg once daily.  5. Patient resume meds per med sheet.  6. Stool softener/laxative of choice as needed.  7. Diet:  No restrictions.  8. Wound care:  The patient is to keep wound clean, dry, and change      dressing daily, call with signs of infection.  9. May shower in 2 days if no drainage.  10.Weightbearing:  The patient is to partial weight bear and 50% left      leg.  11.Follow up with Dr. Madelon Lips 11 days from discharge, call [phone]      for appointment.  12.Home health PT per Genevieve Norlander.  13.Special instructions:  Continue to check CBC on February 14, 2007, call      results in to [phone].   CONDITION ON DISCHARGE:  The patient was discharged to home in  good/stable condition.      Richardean Canal, Arnetha Courser, M.D.   Electronically Signed    GC/MEDQ  D:  04/05/2007  T:  04/06/2007  Job:  [id number]

## 2011-01-11 NOTE — Op Note (Signed)
NAMEAUDELIA, KNAPE NO.:  [id number]   MEDICAL RECORD NO.:  [id number]          PATIENT TYPE:  INP   LOCATION:  2899                         FACILITY:  [facility]   PHYSICIAN:  Dyke Brackett, M.D.    DATE OF BIRTH:  [birth year]   DATE OF PROCEDURE:  02/09/2007  DATE OF DISCHARGE:                               OPERATIVE REPORT   PREOPERATIVE DIAGNOSIS:  Osteoarthritis, knee, with severe varus  deformity and flexion contracture.   POSTOPERATIVE DIAGNOSIS:  Osteoarthritis, knee, with severe varus  deformity and flexion contracture.   PROCEDURE PERFORMED:  DePuy knee (PFC TC3 femoral component, size 3,  size 3 MBT revision cemented tray with 75 mm x 14 mm Universal fluted  stem and 17.5 mm size 3 tibial Sigma bearing with a 35 mm 3-peg oval  dome patella).   SURGEON:  Dyke Brackett, M.D.   ASSISTANT SURGEON:  Chestine Spore, P.A.   TOURNIQUET TIME:  One hour and 55 minutes.   DESCRIPTION OF PROCEDURE:  Sterile prep and drape, exsanguination of the  leg, displacement 375 mm, straight skin incision, medial peripatellar  approach to the knee made.  It should be noted in the record that the  patient had a severe deformity with about a 30-degree varus, severe  osteophytic ridging with almost a translocation of the joint medially,  requiring essentially revision components for a primary knee with a  significant degree of difficulty above the average primary knee.   Initially the tibia was cut provisionally and then additional 2 mm below  the provisional cut.  There was a posteromedial defect.  It was cut with  a 2-degree slope, followed by the distal femoral cut being at 5 degrees  valgus.  The anterior posterior Chamfer cuts were next made after  setting the rotation with the femoral guide for mild external rotation.  Flexion gap equalled the extension gap at 15, and eventually 7.5 mm.  There was stripping of the medial side to correct the varus, but it is  my feeling that  at some point while we initially planned to use a  possibly minimally constrained prosthesis, that we got to a point where  there was slight mismatch of the medial and lateral structures in terms  of varus, valgus; but I was concerned with stripping more off the medial  side.  So we did at that point decide to use a TC3 stabilized  prosthesis.   For this reason, after the Chamfer cuts were made, a box cut was made on  the femur.  The tibia was next addressed with a size 3 tibia.  Initially  reaming we created, not a defect, but a very thinned aspect of the  tibial cortex and it was now seen that the standard length stem would  have only gone a few millimeters below this.  We did not actually broach  the cortex, however.  For this reason, we decided to use a revision  fluted stem to bypass this thinned area and eventually put down a 75 mm  x 14 mm Universal fluted stem.  Trials all tracked  well.  The patella  was cut with a 3-peg size 35 mm patella, oval dome, all poly.  Again,  excellent stability was obtained, no tendency for varus or valgus  instability.  Flexion was very good with no instability and drawer test  minimally positive, and then at that point the trial components were  removed.   Copious irrigation.  Insertion of two batches of cement in the doughy  state, each mixed with 1.2 gm of tobramycin, allowed to hardened.  Excess cement was removed.  Trial bearing was again attempted and it was  elected at that point that there was a better stability with the 17.5 mm  bearing instead of the 15.  Final bearing was inserted.  Small amounts  of cement were removed.  Again, the tourniquet was released prior to the  full poly being put in, and small bleeders were coagulated.  Auto-Vac  transfusion was placed after a Hemovac drain was placed.  Closure was  effected with 0 Ethibond, 2-0 Vicryl and skin clips.  The patient was  taken to the recovery room in stable condition, with a knee  immobilizer  applied.      Dyke Brackett, M.D.  Electronically Signed     WDC/MEDQ  D:  02/09/2007  T:  02/09/2007  Job:  [id number]

## 2011-01-14 NOTE — Op Note (Signed)
NAMEALESE, FURNISS               ACCOUNT NO.:  [id number]   MEDICAL RECORD NO.:  [id number]          PATIENT TYPE:  AMB   LOCATION:  DSC                          FACILITY:  [facility]   PHYSICIAN:  Leonie Man, M.D.   DATE OF BIRTH:  [birth year]   DATE OF PROCEDURE:  08/04/2004  DATE OF DISCHARGE:                                 OPERATIVE REPORT   PREOPERATIVE DIAGNOSIS:  Ductal carcinoma in situ of the left breast.   POSTOPERATIVE DIAGNOSIS:  Ductal carcinoma in situ of the left breast.   PROCEDURE:  Lumpectomy of left breast.   SURGEON:  Leonie Man, M.D.   ASSISTANT:  Nurse.   ANESTHESIA:  General.   NOTE:  Ms. Kievit is a 77 year old woman with a history of colon cancer.  On  recent mammogram and biopsy, she was noted to have a ductal carcinoma in  situ at the 3 o'clock position in the left breast.  The patient comes to the  operating room now for excision of this area.  She is already aware that if  indeed this shows invasive carcinoma, she will need to come back for a  sentinel lymph node biopsy and possible axillary dissection.   She understands and accepts these risks, and gives consent to surgery.   PROCEDURE:  Following the induction of satisfactory general anesthesia, the  patient was positioned supinely and the left breast was prepped and draped  to be included in the sterile operative field.  An elliptical incision was  carried down around the area of the localizing needle, deepened through skin  and subcutaneous tissue, and a wide wedge of breast tissue was taken down to  the chest wall to include the area of needle localization.  This was removed  and forwarded for pathologic evaluation after sutures were placed to mark it  for orientation.  Hemostasis was then obtained with electrocautery.  The  subcutaneous tissues were reapproximated with a running 3-0 Vicryl, skin  closed with running 5-0 Monocryl and then reinforced with Steri-Strips.  Specimen  mammography shows that the calcifications were completely excised.   The wound was then reinforced with Steri-Strips, sterile dressings placed on  the wound, the anesthetic reversed and patient removed from the operating  room to the recovery room in stable condition.  She tolerated the procedure  well.      Patr   PB/MEDQ  D:  08/04/2004  T:  08/05/2004  Job:  [id number]

## 2011-01-14 NOTE — Op Note (Signed)
[facility]  Patient:    Lisa Sawyer, Lisa Sawyer Visit Number: [id number] MRN: [id number]          Service Type: Attending:  Luisa Hart L. Lurene Shadow, M.D. Dictated by:   Mardene Celeste. Lurene Shadow, M.D. Proc. Date: 10/23/01                             Operative Report  PREOPERATIVE DIAGNOSES: 1. Carcinoma of the right colon. 2. Cholelithiasis.  POSTOPERATIVE DIAGNOSES: 1. Carcinoma of the right colon. 2. Cholelithiasis. 3. Pathology pending.  OPERATION: 1. Right hemicolectomy. 2. Cholecystectomy.  SURGEON:  Mardene Celeste. Lurene Shadow, M.D.  ASSISTANT:  Marnee Spring. Wiliam Ke, M.D.  ANESTHESIA:  General.  INDICATIONS: This patient is a 77 year old lady with a large cecal lesion as seen on colonoscopy discovered during routine screening.  She had a colonoscopic biopsy done which shows a villous tumor without any atypia.  She is brought to the operating room now after the risks and benefits of surgery have been fully discussed and she gives consent to surgery.  Also, during preoperative work-up, a CT scan of her abdomen showed that she had cholelithiasis and she also understands that cholecystectomy will be done at this time.  DESCRIPTION OF PROCEDURE:  Following the induction of satisfactory anesthesia, the patient was positioned supinely.  The abdomen was routinely prepped and draped be included a sterile operative field.  A midline incision was made and deepened through the skin and subcutaneous tissues down to the linea alba. The abdomen was entered.  The liver edges were sharp.  The surface was smooth. No liver lesions were noted.  The lesion in the cecum felt rather soft and without any evidence of infiltration onto the serosa.  None of the mesentery appeared to contain large nodes or other metastases.  The sidewalls, visceral and parietal peritoneums were without any evidence of metastatic disease.  Dissection started in the retroperitoneal reflection and carried up along  the reflection around the hepatic flexure and mobilizing the distal ileum totally. The ileum was transected with a GIA stapler as was the proximal transverse colon.  The intervening mesentery was taken between clamps and secured with ties of 2-0 silk.  The middle colic and right colic vessels were secured with ties of 0 silk.  The specimen was removed.  Functional end-to-end anastomosis was carried out with GIA staplers and TA-60 stapling devices resulting in a widely patent well-formed anastomosis.  The mesentery was then closed with interrupted sutures of 3-0 silk.  Attention was then turned to the gallbladder.  The gallbladder was grasped and dissected and carried down in the region of the hepatoduodenal ligament with isolation of the cystic artery and cystic duct, the cystic artery being traced up onto the gallbladder wall and the cystic duct being traced up onto the gallbladder/cystic duct junction.  The common duct could also be seen clearly. The cystic artery was doubly clipped and transected.  The cystic duct was also doubly clipped and transected.  The gallbladder was then dissected free from the liver bed using electrocautery and maintaining hemostasis throughout the course of the dissection.  At the end of the dissections, all areas were checked for hemostasis and noted to be dry.  The peritoneum was then irrigated with normal saline.  The sponge, instrument and sharp counts were verified and the wound closed in layers as follows.  The midline was closed with a running suture of #1 Novofil.  The subcutaneous tissue was irrigated.  Skin was closed with staples.  Sterile dressings were applied to the wound, anesthetic reversed, and the patient removed from the operating room to the recovery room in stable condition.  She tolerated the procedure well. Dictated by:   Mardene Celeste. Lurene Shadow, M.D. Attending:  Mardene Celeste. Lurene Shadow, M.D. DD:  10/23/01 TD:  10/23/01 Job: [id number]

## 2011-01-14 NOTE — Discharge Summary (Signed)
[facility]  Patient:    Lisa Sawyer, Lisa Sawyer Visit Number: [id number] MRN: [id number]          Service Type: SUR Location: [id number] Attending Physician:  Sonda Primes Dictated by:   Mardene Celeste Lurene Shadow, M.D. Admit Date:  10/23/2001 Discharge Date: 10/29/2001                             Discharge Summary  ADMISSION DIAGNOSES: 1. Lesion in cecum, status post biopsy showing tubovillous adenoma. 2. Cholelithiasis.  DISCHARGE DIAGNOSES: 1. Lesion in cecum, status post biopsy showing tubovillous adenoma. 2. Cholelithiasis.  PROCEDURES IN HOSPITAL: 1. Right hemicolectomy with ileocolic anastomosis. 2. Cholecystectomy.  COMPLICATIONS:  None.  CONDITION ON DISCHARGE:  Improved.  HISTORY OF PRESENT ILLNESS:  This patient is a 77 year old woman who presented following colonoscopy with a large sessile lesion in the cecum. She had had a history of multiple polyps in the past and was undergoing screening evaluation.  Biopsy of the polyp showed a tubovillous adenoma because of the large sessile nature. She presented for surgery.  During her work-up she was also noted to have cholelithiasis.  HOSPITAL COURSE:  She was admitted on October 23, 2001, for both right-sided hemicolectomy and cholecystectomy. Following that procedure, she was admitted for postoperative care.  Postoperative care was essentially uneventful with normal resumption bowel activity and diet.  At the time of discharge her wounds are healing well. She is being discharged now to be followed up in the office in two weeks.  DISCHARGE MEDICATION:  Maxidone one to two every four hours p.r.n. for pain.  ACTIVITY:  As tolerated.  DIET:  Unrestricted. Dictated by:   Mardene Celeste. Lurene Shadow, M.D. Attending Physician:  Sonda Primes DD:  10/29/01 TD:  10/29/01 Job: [id number]

## 2011-04-18 ENCOUNTER — Encounter: Payer: PRIVATE HEALTH INSURANCE | Admitting: Hematology & Oncology

## 2011-04-18 ENCOUNTER — Other Ambulatory Visit: Payer: Self-pay | Admitting: Hematology & Oncology

## 2011-04-18 DIAGNOSIS — D649 Anemia, unspecified: Secondary | ICD-10-CM

## 2011-04-18 DIAGNOSIS — D693 Immune thrombocytopenic purpura: Secondary | ICD-10-CM

## 2011-04-18 DIAGNOSIS — D0590 Unspecified type of carcinoma in situ of unspecified breast: Secondary | ICD-10-CM

## 2011-04-18 LAB — CBC WITH DIFFERENTIAL (CANCER CENTER ONLY)
Eosinophils Absolute: 0.2 10*3/uL (ref 0.0–0.5)
HGB: 12.2 g/dL (ref 11.6–15.9)
MCV: 94 fL (ref 81–101)
MONO#: 0.4 10*3/uL (ref 0.1–0.9)
NEUT#: 3.3 10*3/uL (ref 1.5–6.5)
Platelets: 262 10*3/uL (ref 145–400)
RBC: 3.84 10*6/uL (ref 3.70–5.32)
WBC: 5.8 10*3/uL (ref 3.9–10.0)

## 2011-06-15 LAB — CBC
HCT: 35.6 — ABNORMAL LOW
Hemoglobin: 12
MCHC: 33.2
MCHC: 33.8
MCV: 91.7
MCV: 92.9
Platelets: 194
RBC: 3.88
RDW: 13.3

## 2011-06-15 LAB — BASIC METABOLIC PANEL
CO2: 34 — ABNORMAL HIGH
Chloride: 97
Creatinine, Ser: 1.05
GFR calc Af Amer: >60
Sodium: 138

## 2011-06-16 LAB — CROSSMATCH: Antibody Screen: NEGATIVE

## 2011-06-16 LAB — CBC
HCT: 36.5
Hemoglobin: 12.1
MCV: 93
MCV: 93.1
Platelets: 169
RBC: 3.93
WBC: 6.6
WBC: 8.8

## 2011-06-16 LAB — URINALYSIS, ROUTINE W REFLEX MICROSCOPIC
Glucose, UA: NEGATIVE
Hgb urine dipstick: NEGATIVE
Ketones, ur: NEGATIVE
Ketones, ur: NEGATIVE
Nitrite: NEGATIVE
Protein, ur: NEGATIVE
Urobilinogen, UA: 0.2
pH: 6

## 2011-06-16 LAB — URINE MICROSCOPIC-ADD ON

## 2011-06-16 LAB — COMPREHENSIVE METABOLIC PANEL
AST: 26
CO2: 30
Chloride: 100
Creatinine, Ser: 0.98
GFR calc Af Amer: >60
GFR calc non Af Amer: 56 — ABNORMAL LOW
Glucose, Bld: 81
Total Bilirubin: 0.6

## 2011-06-16 LAB — DIFFERENTIAL
Basophils Absolute: 0.1
Eosinophils Absolute: 0.4
Eosinophils Relative: 6 — ABNORMAL HIGH
Lymphocytes Relative: 27
Neutrophils Relative %: 58

## 2011-06-16 LAB — BASIC METABOLIC PANEL
BUN: 8
Chloride: 110
Creatinine, Ser: 0.78
GFR calc non Af Amer: >60

## 2011-06-16 LAB — URINE CULTURE: Culture: NO GROWTH

## 2011-06-16 LAB — PROTIME-INR: Prothrombin Time: 13.5

## 2011-06-16 LAB — ABO/RH: ABO/RH(D): AB POS

## 2011-06-21 ENCOUNTER — Encounter: Payer: Self-pay | Admitting: *Deleted

## 2011-07-13 ENCOUNTER — Other Ambulatory Visit: Payer: Self-pay | Admitting: Family

## 2011-07-13 ENCOUNTER — Ambulatory Visit (HOSPITAL_BASED_OUTPATIENT_CLINIC_OR_DEPARTMENT_OTHER): Payer: PRIVATE HEALTH INSURANCE | Admitting: Hematology & Oncology

## 2011-07-13 ENCOUNTER — Other Ambulatory Visit (HOSPITAL_BASED_OUTPATIENT_CLINIC_OR_DEPARTMENT_OTHER): Payer: PRIVATE HEALTH INSURANCE | Admitting: Lab

## 2011-07-13 DIAGNOSIS — D0510 Intraductal carcinoma in situ of unspecified breast: Secondary | ICD-10-CM

## 2011-07-13 DIAGNOSIS — E039 Hypothyroidism, unspecified: Secondary | ICD-10-CM

## 2011-07-13 DIAGNOSIS — D693 Immune thrombocytopenic purpura: Secondary | ICD-10-CM

## 2011-07-13 DIAGNOSIS — R5381 Other malaise: Secondary | ICD-10-CM

## 2011-07-13 DIAGNOSIS — Z87898 Personal history of other specified conditions: Secondary | ICD-10-CM

## 2011-07-13 DIAGNOSIS — D649 Anemia, unspecified: Secondary | ICD-10-CM

## 2011-07-13 DIAGNOSIS — D0590 Unspecified type of carcinoma in situ of unspecified breast: Secondary | ICD-10-CM

## 2011-07-13 DIAGNOSIS — C50919 Malignant neoplasm of unspecified site of unspecified female breast: Secondary | ICD-10-CM

## 2011-07-13 DIAGNOSIS — E119 Type 2 diabetes mellitus without complications: Secondary | ICD-10-CM

## 2011-07-13 NOTE — Progress Notes (Signed)
CC:   Maurice March, M.D.  DIAGNOSES: 1. History of ductal carcinoma in situ of the left breast. 2. 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  CURRENT THERAPY:  Observation.  INTERIM HISTORY:  Lisa Sawyer comes in for followup.  We see her yearly now.  She is doing okay.  She has not noted any bleeding or bruising. She has not had any problems with cough or shortness of breath.  She has had no change in bowel or bladder habits.  She is fatigued.  She does have diabetes.  This may be her history more than anything else.  She said that she had a mammogram earlier this year.  PHYSICAL EXAMINATION:  General:  This is a well-developed, well- nourished African American female in no obvious distress.  Vital Signs: Temperature 97.3, pulse 53, respiratory rate 18, blood pressure 162/61. Weight is 143.  Head/Neck:  Exam shows a normocephalic, atraumatic skull.  There are no ocular or oral lesions.  There are no palpable cervical or supraclavicular lymph nodes.  Lungs:  Clear bilaterally. Cardiac:  Regular rate and rhythm with a normal S1, S2.  There are no murmurs, rubs or bruits.  Breasts:  Right breast with no masses, edema or erythema.  There is no right axillary adenopathy.  Left breast shows a well-healed lumpectomy scar at the 2 o'clock position.  There is some firmness at the lumpectomy site.  She has no tenderness over the left breast.  There is no left axillary adenopathy.  Abdomen:  Soft with good bowel sounds.  There is no palpable abdominal mass.  There is no fluid wave.  There is no hepatosplenomegaly.  Back:  No tenderness of the spine, ribs, or hips.  There is no kyphosis or osteoporotic changes. Extremities:  No clubbing, cyanosis or edema.  Neurologic:  No focal neurological deficits.  LABORATORIES:  Pending.  IMPRESSION:  Ms. ortman is a 77 year old African American female with a history of ductal carcinoma in situ of the left breast.  She underwent lumpectomy  over 5 years ago.  She had radiation followed by [facility].  She is doing well.  There is no evidence of recurrence.  I would not suspect that her blood work is abnormal.  I think that a lot of her problems might be from the diabetes.  We will get her back in 1 more year.  Updraft is no growth.    ______________________________ Josph Macho, M.D. PRE/MEDQ  D:  07/13/2011  T:  07/13/2011  Job:  [id number]

## 2011-07-13 NOTE — Progress Notes (Signed)
This office note has been dictated. Josph Macho CSN: [id number]

## 2011-07-28 ENCOUNTER — Encounter: Payer: Self-pay | Admitting: *Deleted

## 2011-07-28 NOTE — Progress Notes (Signed)
Mailed labs from 07/13/11 to pt's home per her request.

## 2011-07-30 ENCOUNTER — Emergency Department (INDEPENDENT_AMBULATORY_CARE_PROVIDER_SITE_OTHER)
Admission: EM | Admit: 2011-07-30 | Discharge: 2011-07-30 | Disposition: A | Discharge status: Home / Self Care | Payer: PRIVATE HEALTH INSURANCE | Source: Home / Self Care

## 2011-07-30 DIAGNOSIS — R5383 Other fatigue: Secondary | ICD-10-CM

## 2011-07-30 DIAGNOSIS — I10 Essential (primary) hypertension: Secondary | ICD-10-CM

## 2011-07-30 HISTORY — DX: Disorder of thyroid, unspecified: E07.9

## 2011-07-30 HISTORY — DX: Essential (primary) hypertension: I10

## 2011-07-30 LAB — POCT I-STAT, CHEM 8
BUN: 24 mg/dL — ABNORMAL HIGH (ref 6–23)
Chloride: 98 mEq/L (ref 96–112)
Creatinine, Ser: 1.1 mg/dL (ref 0.50–1.10)
Glucose, Bld: 123 mg/dL — ABNORMAL HIGH (ref 70–99)
HCT: 42 % (ref 36.0–46.0)
Potassium: 4.2 mEq/L (ref 3.5–5.1)

## 2011-07-30 LAB — POCT URINALYSIS DIP (DEVICE)
Bilirubin Urine: NEGATIVE
Glucose, UA: NEGATIVE mg/dL
Specific Gravity, Urine: 1.01 (ref 1.005–1.030)

## 2011-07-30 NOTE — ED Provider Notes (Signed)
History     CSN: [id number] Arrival date & time: 07/30/2011  6:40 PM   None     Chief Complaint  Patient presents with  . Fatigue    Pt states she has fatigue, headache and low abd pain for one year, worse lately, has hx of low platelets and was eval in Oct    (Consider location/radiation/quality/duration/timing/severity/associated sxs/prior treatment) HPI Comments: Pt states that she has fatigue, chills, dry flaky skin, cracking finger nails, nervous, excited easily, and family stress. Symptoms have been ongoing for one year. She is worried that the HCTZ is drying out her skin and she should stop taking it and switch to another BP medication. Also wonders if her thyroid medication should be adjusted. She states she was seen at [facility] and had labs done last month. She was told that her thyroid level was OK. She also brought a copy of her CBC which was WNL. She states she spoke to a dr at [facility] during a family members appt last week about changing her BP medication but was told that they were unable to do this without her record and this frustrated her.   The history is provided by the patient.    Past Medical History  Diagnosis Date  . Hypertension   . Thyroid disease     Past Surgical History  Procedure Date  . Cholecystectomy   . Colon surgery     History reviewed. No pertinent family history.  History  Substance Use Topics  . Smoking status: Not on file  . Smokeless tobacco: Not on file  . Alcohol Use:     OB History    Grav Para Term Preterm Abortions TAB SAB Ect Mult Living                  Review of Systems  Constitutional: Positive for chills and fatigue. Negative for fever.  HENT: Negative for ear pain, sore throat and rhinorrhea.   Respiratory: Negative for cough and shortness of breath.   Cardiovascular: Negative for chest pain.    Allergies  Review of patient's allergies indicates no known allergies.  Home Medications   Current  Outpatient Rx  Name Route Sig Dispense Refill  . AMLODIPINE BESYLATE 5 MG PO TABS Oral Take 5 mg by mouth daily.      . ASPIRIN 81 MG PO TABS Oral Take 81 mg by mouth daily.      Marland Kitchen CALCIUM-VITAMIN D 250-125 MG-UNIT PO TABS Oral Take 1 tablet by mouth daily.      . OMEGA-3 FATTY ACIDS 1000 MG PO CAPS Oral Take 600 mg by mouth 2 (two) times daily.      Marland Kitchen LEVOTHYROXINE SODIUM 100 MCG PO TABS Oral Take 100 mcg by mouth daily.      Marland Kitchen LISINOPRIL-HYDROCHLOROTHIAZIDE 20-25 MG PO TABS Oral Take 1 tablet by mouth daily.      Marland Kitchen LORATADINE 10 MG PO TABS Oral Take 10 mg by mouth daily.      . MOMETASONE FUROATE 50 MCG/ACT NA SUSP Nasal Place 2 sprays into the nose daily.      Marland Kitchen ONE-DAILY MULTI VITAMINS PO TABS Oral Take 1 tablet by mouth daily.      . ACETAMINOPHEN 325 MG PO TABS Oral Take 650 mg by mouth every 6 (six) hours as needed.      . ATORVASTATIN CALCIUM 20 MG PO TABS Oral Take 20 mg by mouth daily.      . DESLORATADINE 5 MG PO  TABS Oral Take 5 mg by mouth daily.      Marland Kitchen LETROZOLE 2.5 MG PO TABS Oral Take 2.5 mg by mouth daily.      Marland Kitchen LISINOPRIL 20 MG PO TABS Oral Take 20 mg by mouth daily.       BP 176/78  Pulse 98  Temp(Src) 98.8 F (37.1 C) (Oral)  Resp 22  SpO2 98%  Physical Exam  Nursing note and vitals reviewed. Constitutional: She appears well-developed and well-nourished. No distress.  HENT:  Head: Normocephalic and atraumatic.  Right Ear: Tympanic membrane, external ear and ear canal normal.  Left Ear: Tympanic membrane, external ear and ear canal normal.  Nose: Nose normal.  Mouth/Throat: Uvula is midline, oropharynx is clear and moist and mucous membranes are normal. No oropharyngeal exudate, posterior oropharyngeal edema or posterior oropharyngeal erythema.  Neck: Neck supple.  Cardiovascular: Normal rate, regular rhythm and normal heart sounds.   Pulmonary/Chest: Effort normal and breath sounds normal. No respiratory distress.  Lymphadenopathy:    She has no cervical  adenopathy.  Neurological: She is alert.  Skin: Skin is warm and dry.  Psychiatric: She has a normal mood and affect.    ED Course  Procedures (including critical care time)  Labs Reviewed  POCT URINALYSIS DIP (DEVICE) - Abnormal; Notable for the following:    Hgb urine dipstick TRACE (*)    Leukocytes, UA SMALL (*) Biochemical Testing Only. Please order routine urinalysis from main lab if confirmatory testing is needed.   All other components within normal limits  POCT URINALYSIS DIPSTICK  I-STAT, CHEM 8   No results found.   No diagnosis found.    MDM          Melody Comas, PA 07/30/11 2138

## 2011-07-30 NOTE — ED Provider Notes (Signed)
Medical screening examination/treatment/procedure(s) were performed by non-physician practitioner and as supervising physician I was immediately available for consultation/collaboration.  LANEY,RONNIE   Ronnie Laney, MD 07/30/11 2222 

## 2011-08-02 ENCOUNTER — Encounter (HOSPITAL_COMMUNITY): Payer: Self-pay

## 2011-08-02 ENCOUNTER — Other Ambulatory Visit: Payer: Self-pay

## 2011-08-02 ENCOUNTER — Emergency Department (HOSPITAL_COMMUNITY)
Admission: EM | Admit: 2011-08-02 | Discharge: 2011-08-02 | Disposition: A | Discharge status: Home / Self Care | Payer: PRIVATE HEALTH INSURANCE | Attending: Emergency Medicine | Admitting: Emergency Medicine

## 2011-08-02 ENCOUNTER — Emergency Department (HOSPITAL_COMMUNITY): Payer: PRIVATE HEALTH INSURANCE

## 2011-08-02 DIAGNOSIS — R0602 Shortness of breath: Secondary | ICD-10-CM | POA: Insufficient documentation

## 2011-08-02 DIAGNOSIS — I10 Essential (primary) hypertension: Secondary | ICD-10-CM | POA: Insufficient documentation

## 2011-08-02 DIAGNOSIS — R002 Palpitations: Secondary | ICD-10-CM

## 2011-08-02 DIAGNOSIS — H539 Unspecified visual disturbance: Secondary | ICD-10-CM | POA: Insufficient documentation

## 2011-08-02 DIAGNOSIS — D696 Thrombocytopenia, unspecified: Secondary | ICD-10-CM | POA: Insufficient documentation

## 2011-08-02 DIAGNOSIS — E079 Disorder of thyroid, unspecified: Secondary | ICD-10-CM | POA: Insufficient documentation

## 2011-08-02 DIAGNOSIS — R42 Dizziness and giddiness: Secondary | ICD-10-CM | POA: Insufficient documentation

## 2011-08-02 DIAGNOSIS — D649 Anemia, unspecified: Secondary | ICD-10-CM | POA: Insufficient documentation

## 2011-08-02 DIAGNOSIS — Z79899 Other long term (current) drug therapy: Secondary | ICD-10-CM | POA: Insufficient documentation

## 2011-08-02 DIAGNOSIS — R5381 Other malaise: Secondary | ICD-10-CM | POA: Insufficient documentation

## 2011-08-02 HISTORY — DX: Anemia, unspecified: D64.9

## 2011-08-02 HISTORY — DX: Thrombocytopenia, unspecified: D69.6

## 2011-08-02 LAB — BASIC METABOLIC PANEL
BUN: 15 mg/dL (ref 6–23)
CO2: 30 mEq/L (ref 19–32)
Calcium: 10.5 mg/dL (ref 8.4–10.5)
Chloride: 92 mEq/L — ABNORMAL LOW (ref 96–112)
Creatinine, Ser: 0.95 mg/dL (ref 0.50–1.10)
GFR calc Af Amer: 65 mL/min — ABNORMAL LOW (ref >90)
GFR calc non Af Amer: 56 mL/min — ABNORMAL LOW (ref >90)
Glucose, Bld: 96 mg/dL (ref 70–99)
Potassium: 4 mEq/L (ref 3.5–5.1)
Sodium: 132 mEq/L — ABNORMAL LOW (ref 135–145)

## 2011-08-02 LAB — CBC
HCT: 37.6 % (ref 36.0–46.0)
Hemoglobin: 12.6 g/dL (ref 12.0–15.0)
MCH: 30.9 pg (ref 26.0–34.0)
MCHC: 33.5 g/dL (ref 30.0–36.0)
MCV: 92.2 fL (ref 78.0–100.0)
Platelets: 276 10*3/uL (ref 150–400)
RBC: 4.08 MIL/uL (ref 3.87–5.11)
RDW: 12.5 % (ref 11.5–15.5)
WBC: 5.6 10*3/uL (ref 4.0–10.5)

## 2011-08-02 LAB — URINALYSIS, ROUTINE W REFLEX MICROSCOPIC
Bilirubin Urine: NEGATIVE
Glucose, UA: NEGATIVE mg/dL
Hgb urine dipstick: NEGATIVE
Ketones, ur: NEGATIVE mg/dL
Nitrite: NEGATIVE
Protein, ur: NEGATIVE mg/dL
Specific Gravity, Urine: 1.005 (ref 1.005–1.030)
Urobilinogen, UA: 0.2 mg/dL (ref 0.0–1.0)
pH: 7.5 (ref 5.0–8.0)

## 2011-08-02 LAB — URINE MICROSCOPIC-ADD ON

## 2011-08-02 LAB — CARDIAC PANEL(CRET KIN+CKTOT+MB+TROPI)
CK, MB: 3.8 ng/mL (ref 0.3–4.0)
CK, MB: 3.9 ng/mL (ref 0.3–4.0)
Relative Index: 3.4 — ABNORMAL HIGH (ref 0.0–2.5)
Relative Index: 1.4 (ref 0.0–2.5)
Total CK: 112 U/L (ref 7–177)
Total CK: 269 U/L — ABNORMAL HIGH (ref 7–177)
Troponin I: <0.3 ng/mL (ref <0.30)
Troponin I: <0.3 ng/mL (ref <0.30)

## 2011-08-02 NOTE — ED Provider Notes (Signed)
Evaluation and management procedures were performed by the PA/NP under my supervision/collaboration.   Dione Booze, MD 08/02/11 [phone]

## 2011-08-02 NOTE — ED Notes (Signed)
Returned from xray

## 2011-08-02 NOTE — ED Provider Notes (Signed)
Relates this morning she was sitting resting and acutely felt like her heart started beating hard and fast. She states it lasted about 5 minutes. She states she felt dizzy and had mild shortness of breath she denies chest pain or diaphoresis. She does state however since that happened when she walks she feels like her heart starts racing again. She states she's never had any cardiac problems in the past.  Primary care physician [facility], has an appt tomorrow to get her handicap sticker renewed  She is alert and cooperative in no distress. During our conversation her heart rate was 50-53. I have talked to the tech and we are going to ambulate patient with portable pulse ox to  see what her heart rate does with ambulation.   Medical screening examination/treatment/procedure(s) were conducted as a shared visit with non-physician practitioner(s) and myself.  I personally evaluated the patient during the encounter Devoria Albe, MD, Franz Dell, MD 08/02/11 Windell Moment

## 2011-08-02 NOTE — ED Provider Notes (Signed)
.  See prior note   Ward Givens, MD 08/02/11 [phone]

## 2011-08-02 NOTE — ED Notes (Signed)
Pt reports with year-long h/o weakness and intermittent dizziness that worsened today.  Pt reports onset of palpitations and shortness of breath this morning.  Pt seen at Urgent Care 2 days ago for weakness, had blood drawn with negative results but reports hypertension.

## 2011-08-02 NOTE — ED Provider Notes (Signed)
History     CSN: [id number] Arrival date & time: 08/02/2011 11:27 AM   First MD Initiated Contact with Patient 08/02/11 1251      Chief Complaint  Patient presents with  . Palpitations    (Consider location/radiation/quality/duration/timing/severity/associated sxs/prior treatment) The history is provided by the patient.    77 y.o. female presents to the emergency department today c/o of an episode of dizziness and weakness this morning.  States Hx of fatigue and mild weakness x1 year with 30-40 lb weight loss.  Pt states she arose from bed this morning around 6:45 am and within 15 min had an episode of dizziness and weakness accompanied by vision changes, mild shortness of breath and palpitations lasting about 5 min.  Pt denies chest pain, diaphoresis, dysuria, nausea, vomiting, diarrhea, anorexia or feeling like she was going to pass out.  Hx of anemia, thrombocytopenia.  Pt denies cardiac history.   Past Medical History  Diagnosis Date  . Hypertension   . Thyroid disease   . Anemia   . Thrombocytopenia     Past Surgical History  Procedure Date  . Cholecystectomy   . Colon surgery     History reviewed. No pertinent family history.  History  Substance Use Topics  . Smoking status: Never Smoker   . Smokeless tobacco: Not on file  . Alcohol Use: No    OB History    Grav Para Term Preterm Abortions TAB SAB Ect Mult Living                  Review of Systems  Allergies  Review of patient's allergies indicates no known allergies.  Home Medications   Current Outpatient Rx  Name Route Sig Dispense Refill  . ACETAMINOPHEN 325 MG PO TABS Oral Take 650 mg by mouth every 6 (six) hours as needed.      Marland Kitchen AMLODIPINE BESYLATE 5 MG PO TABS Oral Take 5 mg by mouth daily.     . ASPIRIN EC 81 MG PO TBEC Oral Take 81 mg by mouth daily.      Marland Kitchen CALCIUM-VITAMIN D 250-125 MG-UNIT PO TABS Oral Take 1 tablet by mouth daily.      . OMEGA-3 FATTY ACIDS 1000 MG PO CAPS Oral Take 1 g by  mouth daily.     Marland Kitchen HYPROMELLOSE 2.5 % OP SOLN Both Eyes Place 2 drops into both eyes daily as needed. For dry eyes     . LEVOTHYROXINE SODIUM 100 MCG PO TABS Oral Take 100 mcg by mouth daily.      Marland Kitchen LISINOPRIL-HYDROCHLOROTHIAZIDE 20-25 MG PO TABS Oral Take 1 tablet by mouth daily.      Marland Kitchen LORATADINE 10 MG PO TABS Oral Take 10 mg by mouth daily.      Marland Kitchen ONE-DAILY MULTI VITAMINS PO TABS Oral Take 1 tablet by mouth daily.      . MOMETASONE FUROATE 50 MCG/ACT NA SUSP Nasal Place 2 sprays into the nose daily as needed.       BP 160/54  Pulse 56  Temp(Src) 98.2 F (36.8 C) (Oral)  Resp 10  Ht 5\' 2"  (1.575 m)  Wt 145 lb (65.772 kg)  BMI 26.52 kg/m2  SpO2 99%  Physical Exam  Constitutional: She is oriented to person, place, and time. She appears well-developed and well-nourished.  HENT:  Head: Normocephalic and atraumatic.  Eyes: Pupils are equal, round, and reactive to light.  Neck: Normal range of motion. Neck supple.  Cardiovascular: Normal rate, regular rhythm, normal  heart sounds and intact distal pulses.        Heart rate hovering in the 50's at rest, increases to mid 70's with ambulation   Pulmonary/Chest: Effort normal and breath sounds normal. No accessory muscle usage. Not tachypneic. No respiratory distress. She has no wheezes. She has no rales.  Abdominal: Soft. Bowel sounds are normal. She exhibits no distension and no mass. There is no hepatosplenomegaly. There is no tenderness. There is no rebound and no guarding.  Musculoskeletal: Normal range of motion. She exhibits no edema.  Lymphadenopathy:       Head (right side): No submental, no submandibular, no tonsillar, no preauricular, no posterior auricular and no occipital adenopathy present.       Head (left side): No submental, no submandibular, no tonsillar, no preauricular, no posterior auricular and no occipital adenopathy present.    She has no cervical adenopathy.  Neurological: She is alert and oriented to person, place,  and time. She has normal strength and normal reflexes. No cranial nerve deficit or sensory deficit. Coordination and gait normal. GCS eye subscore is 4. GCS verbal subscore is 5. GCS motor subscore is 6.  Skin: Skin is warm and dry.  Psychiatric: She has a normal mood and affect. Her behavior is normal. Judgment and thought content normal.    ED Course  Procedures (including critical care time)  Labs Reviewed  BASIC METABOLIC PANEL - Abnormal; Notable for the following:    Sodium 132 (*)    Chloride 92 (*)    GFR calc non Af Amer 56 (*)    GFR calc Af Amer 65 (*)    All other components within normal limits  URINALYSIS, ROUTINE W REFLEX MICROSCOPIC - Abnormal; Notable for the following:    Leukocytes, UA TRACE (*)    All other components within normal limits  CARDIAC PANEL(CRET KIN+CKTOT+MB+TROPI) - Abnormal; Notable for the following:    Relative Index 3.4 (*)    All other components within normal limits  CBC  URINE MICROSCOPIC-ADD ON     The [facility] where we have asked her to request a Holter monitor evaluation.  Told to return here as needed for any worsening in her condition.  She was able to ambulate about the department and there is no palpitations or tachycardia noted during this ambulation.  She is agreeable to this plan and voices an understanding.     MDM      Date: 08/02/2011  Rate:57  Rhythm: normal sinus rhythm  QRS Axis: normal  Intervals: normal  ST/T Wave abnormalities: nonspecific ST changes  Conduction Disutrbances:none  Narrative Interpretation:   Old EKG Reviewed: unchanged       Carlyle Dolly, PA-C 08/02/11 1630  Carlyle Dolly, PA-C 08/02/11 [phone]

## 2011-08-02 NOTE — ED Provider Notes (Signed)
5:37 PM I received signout on the patient from Lawyer, PA-C. We were awaiting repeat cardiac enzymes, which are unremarkable. Pt has follow-up appointment with [facility] tomorrow. She will likely need Holter monitoring to further eval palpitations. Findings and plan discussed with patient. She verbalized understanding and agreed to plan.  Grant Fontana, [place] 08/02/11 1740

## 2011-08-02 NOTE — ED Notes (Signed)
Discharge instructions reviewed with Pt; verbalizes understanding. No questions asked; no further c/o's voiced.  Pt ambulatory to lobby.  NAD noted.  VSS.

## 2011-08-13 ENCOUNTER — Encounter (HOSPITAL_COMMUNITY): Payer: Self-pay

## 2011-08-13 ENCOUNTER — Other Ambulatory Visit: Payer: Self-pay

## 2011-08-13 ENCOUNTER — Emergency Department (HOSPITAL_COMMUNITY)
Admission: EM | Admit: 2011-08-13 | Discharge: 2011-08-14 | Disposition: A | Discharge status: Home / Self Care | Payer: PRIVATE HEALTH INSURANCE | Attending: Emergency Medicine | Admitting: Emergency Medicine

## 2011-08-13 DIAGNOSIS — R0602 Shortness of breath: Secondary | ICD-10-CM | POA: Insufficient documentation

## 2011-08-13 DIAGNOSIS — R002 Palpitations: Secondary | ICD-10-CM

## 2011-08-13 DIAGNOSIS — E871 Hypo-osmolality and hyponatremia: Secondary | ICD-10-CM

## 2011-08-13 DIAGNOSIS — I10 Essential (primary) hypertension: Secondary | ICD-10-CM | POA: Insufficient documentation

## 2011-08-13 NOTE — ED Notes (Signed)
Pt c/o palpitations x 1 week ago; was seen here at [facility] and sent home. Was to follow up w/ cardiologist for a holter monitor. Denies SOB; also c/o dizziness and weakness. Pt had 324mg  asa prior to EMS arriving at her home.

## 2011-08-14 ENCOUNTER — Encounter (HOSPITAL_COMMUNITY): Payer: Self-pay | Admitting: Emergency Medicine

## 2011-08-14 LAB — CBC
HCT: 35.5 % — ABNORMAL LOW (ref 36.0–46.0)
MCV: 91.7 fL (ref 78.0–100.0)
RBC: 3.87 MIL/uL (ref 3.87–5.11)
WBC: 6.5 10*3/uL (ref 4.0–10.5)

## 2011-08-14 LAB — BASIC METABOLIC PANEL
BUN: 20 mg/dL (ref 6–23)
CO2: 29 mEq/L (ref 19–32)
Calcium: 10.6 mg/dL — ABNORMAL HIGH (ref 8.4–10.5)
Creatinine, Ser: 1.05 mg/dL (ref 0.50–1.10)
Glucose, Bld: 109 mg/dL — ABNORMAL HIGH (ref 70–99)

## 2011-08-14 LAB — DIFFERENTIAL
Eosinophils Relative: 4 % (ref 0–5)
Lymphocytes Relative: 26 % (ref 12–46)
Lymphs Abs: 1.7 10*3/uL (ref 0.7–4.0)
Monocytes Absolute: 0.5 10*3/uL (ref 0.1–1.0)

## 2011-08-14 LAB — CARDIAC PANEL(CRET KIN+CKTOT+MB+TROPI): Relative Index: INVALID (ref 0.0–2.5)

## 2011-08-14 NOTE — ED Notes (Signed)
Paged [place]  Card fellow and he responded @ [phone] for Dr. Norlene Campbell

## 2011-08-14 NOTE — ED Provider Notes (Signed)
History     CSN: [id number] Arrival date & time: 08/13/2011 11:22 PM   First MD Initiated Contact with Patient 08/13/11 2330      Chief Complaint  Patient presents with  . Palpitations    (Consider location/radiation/quality/duration/timing/severity/associated sxs/prior treatment) HPI 77 year old female presents to emergency department complaining of palpitations. Patient has had ongoing palpitations for the last month. Patient was seen for same about a week ago in the emergency department and referred for outpatient followup. She reports she's been unable to see cardiology as of yet. Patient reports she has brief periods where she feels like her heart is racing and she is short of breath when she is up and walking around, if she sits down these episodes resolve. Patient denies chest pain, abdominal pain, fever chills, nausea vomiting. She has had no lower extremity swelling. No prior history of DVT or PE. Patient is followed at [facility]. She does have an appointment in approximately 3 weeks with cardiology for outpatient Holter monitor  Past Medical History  Diagnosis Date  . Hypertension   . Thyroid disease   . Anemia   . Thrombocytopenia     Past Surgical History  Procedure Date  . Cholecystectomy   . Colon surgery     History reviewed. No pertinent family history.  History  Substance Use Topics  . Smoking status: Never Smoker   . Smokeless tobacco: Not on file  . Alcohol Use: No    OB History    Grav Para Term Preterm Abortions TAB SAB Ect Mult Living                  Review of Systems  All other systems reviewed and are negative.    Allergies  Review of patient's allergies indicates no known allergies.  Home Medications   Current Outpatient Rx  Name Route Sig Dispense Refill  . ACETAMINOPHEN 325 MG PO TABS Oral Take 650 mg by mouth every 6 (six) hours as needed. For pain    . AMLODIPINE BESYLATE 5 MG PO TABS Oral Take 5 mg by mouth daily.     .  ASPIRIN EC 81 MG PO TBEC Oral Take 81 mg by mouth daily.      Marland Kitchen CALCIUM-VITAMIN D 250-125 MG-UNIT PO TABS Oral Take 1 tablet by mouth daily.      . OMEGA-3 FATTY ACIDS 1000 MG PO CAPS Oral Take 1 g by mouth daily.     Marland Kitchen HYPROMELLOSE 2.5 % OP SOLN Both Eyes Place 2 drops into both eyes daily as needed. For dry eyes     . LEVOTHYROXINE SODIUM 100 MCG PO TABS Oral Take 100 mcg by mouth daily.      Marland Kitchen LISINOPRIL-HYDROCHLOROTHIAZIDE 20-25 MG PO TABS Oral Take 1 tablet by mouth daily.      Marland Kitchen LORATADINE 10 MG PO TABS Oral Take 10 mg by mouth daily.      . MOMETASONE FUROATE 50 MCG/ACT NA SUSP Nasal Place 2 sprays into the nose daily as needed. For allergies    . ONE-DAILY MULTI VITAMINS PO TABS Oral Take 1 tablet by mouth daily.        BP 140/49  Pulse 57  Temp(Src) 98.1 F (36.7 C) (Oral)  Resp 14  SpO2 100%  Physical Exam  Nursing note and vitals reviewed. Constitutional: She is oriented to person, place, and time. She appears well-developed and well-nourished.  HENT:  Head: Normocephalic and atraumatic.  Right Ear: External ear normal.  Left Ear: External  ear normal.  Nose: Nose normal.  Mouth/Throat: Oropharynx is clear and moist.  Eyes: Conjunctivae and EOM are normal. Pupils are equal, round, and reactive to light.  Neck: Normal range of motion. Neck supple. No JVD present. No tracheal deviation present. No thyromegaly present.  Cardiovascular: Normal rate, regular rhythm, normal heart sounds and intact distal pulses.  Exam reveals no gallop and no friction rub.   No murmur heard. Pulmonary/Chest: Effort normal and breath sounds normal. No stridor. No respiratory distress. She has no wheezes. She has no rales. She exhibits no tenderness.  Abdominal: Soft. Bowel sounds are normal. She exhibits no distension and no mass. There is no tenderness. There is no rebound and no guarding.  Musculoskeletal: Normal range of motion. She exhibits no edema and no tenderness.  Lymphadenopathy:     She has no cervical adenopathy.  Neurological: She is oriented to person, place, and time. She has normal reflexes. No cranial nerve deficit. She exhibits normal muscle tone. Coordination normal.  Skin: Skin is dry. No rash noted. No erythema. No pallor.  Psychiatric: She has a normal mood and affect. Her behavior is normal. Judgment and thought content normal.    ED Course  Procedures (including critical care time)  Labs Reviewed  CBC - Abnormal; Notable for the following:    HCT 35.5 (*)    All other components within normal limits  BASIC METABOLIC PANEL - Abnormal; Notable for the following:    Sodium 129 (*)    Chloride 91 (*)    Glucose, Bld 109 (*)    Calcium 10.6 (*)    GFR calc non Af Amer 50 (*)    GFR calc Af Amer 58 (*)    All other components within normal limits  DIFFERENTIAL  D-DIMER, QUANTITATIVE  CARDIAC PANEL(CRET KIN+CKTOT+MB+TROPI)  LAB REPORT - SCANNED    Date: 08/13/2011  Rate: 59  Rhythm: normal sinus rhythm  QRS Axis: normal  Intervals: normal  ST/T Wave abnormalities: normal  Conduction Disutrbances:none  Narrative Interpretation:   Old EKG Reviewed: unchanged   1. Hyponatremia   2. Palpitations       MDM   77 yo female with palpitations.  Workup unremarkable other that slightly low sodium.  D/w on call [place] cards fellow who will have clinic contact patient on Monday for sooner appointment.         Olivia Mackie, MD 08/15/11 [phone]

## 2011-08-14 NOTE — ED Notes (Signed)
Patient is resting comfortably. 

## 2011-08-15 ENCOUNTER — Encounter (HOSPITAL_COMMUNITY): Payer: Self-pay | Admitting: Cardiology

## 2011-08-15 ENCOUNTER — Other Ambulatory Visit: Payer: Self-pay

## 2011-08-15 ENCOUNTER — Emergency Department (HOSPITAL_COMMUNITY)
Admission: EM | Admit: 2011-08-15 | Discharge: 2011-08-15 | Disposition: A | Discharge status: Home / Self Care | Payer: PRIVATE HEALTH INSURANCE | Attending: Emergency Medicine | Admitting: Emergency Medicine

## 2011-08-15 ENCOUNTER — Emergency Department (HOSPITAL_COMMUNITY): Payer: PRIVATE HEALTH INSURANCE

## 2011-08-15 DIAGNOSIS — R5381 Other malaise: Secondary | ICD-10-CM | POA: Insufficient documentation

## 2011-08-15 DIAGNOSIS — R002 Palpitations: Secondary | ICD-10-CM

## 2011-08-15 DIAGNOSIS — I10 Essential (primary) hypertension: Secondary | ICD-10-CM | POA: Insufficient documentation

## 2011-08-15 DIAGNOSIS — E079 Disorder of thyroid, unspecified: Secondary | ICD-10-CM | POA: Insufficient documentation

## 2011-08-15 DIAGNOSIS — R531 Weakness: Secondary | ICD-10-CM

## 2011-08-15 DIAGNOSIS — R55 Syncope and collapse: Secondary | ICD-10-CM | POA: Insufficient documentation

## 2011-08-15 DIAGNOSIS — R6889 Other general symptoms and signs: Secondary | ICD-10-CM | POA: Insufficient documentation

## 2011-08-15 DIAGNOSIS — Z7982 Long term (current) use of aspirin: Secondary | ICD-10-CM | POA: Insufficient documentation

## 2011-08-15 DIAGNOSIS — R42 Dizziness and giddiness: Secondary | ICD-10-CM | POA: Insufficient documentation

## 2011-08-15 DIAGNOSIS — Z79899 Other long term (current) drug therapy: Secondary | ICD-10-CM | POA: Insufficient documentation

## 2011-08-15 DIAGNOSIS — E871 Hypo-osmolality and hyponatremia: Secondary | ICD-10-CM | POA: Insufficient documentation

## 2011-08-15 LAB — DIFFERENTIAL
Basophils Relative: 1 % (ref 0–1)
Eosinophils Absolute: 0.2 10*3/uL (ref 0.0–0.7)
Eosinophils Relative: 3 % (ref 0–5)
Lymphs Abs: 1.1 10*3/uL (ref 0.7–4.0)
Monocytes Absolute: 0.4 10*3/uL (ref 0.1–1.0)
Monocytes Relative: 7 % (ref 3–12)
Neutrophils Relative %: 68 % (ref 43–77)

## 2011-08-15 LAB — CK TOTAL AND CKMB (NOT AT ARMC)
Relative Index: INVALID (ref 0.0–2.5)
Total CK: 80 U/L (ref 7–177)

## 2011-08-15 LAB — URINALYSIS, ROUTINE W REFLEX MICROSCOPIC
Bilirubin Urine: NEGATIVE
Hgb urine dipstick: NEGATIVE
Ketones, ur: NEGATIVE mg/dL
Specific Gravity, Urine: 1.006 (ref 1.005–1.030)
pH: 7 (ref 5.0–8.0)

## 2011-08-15 LAB — COMPREHENSIVE METABOLIC PANEL
Alkaline Phosphatase: 51 U/L (ref 39–117)
BUN: 13 mg/dL (ref 6–23)
Creatinine, Ser: 0.96 mg/dL (ref 0.50–1.10)
GFR calc Af Amer: 64 mL/min — ABNORMAL LOW (ref >90)
Glucose, Bld: 104 mg/dL — ABNORMAL HIGH (ref 70–99)
Potassium: 4.2 mEq/L (ref 3.5–5.1)
Total Bilirubin: 0.4 mg/dL (ref 0.3–1.2)
Total Protein: 7.5 g/dL (ref 6.0–8.3)

## 2011-08-15 LAB — CBC
HCT: 36.1 % (ref 36.0–46.0)
Hemoglobin: 12.1 g/dL (ref 12.0–15.0)
MCH: 30.7 pg (ref 26.0–34.0)
MCHC: 33.5 g/dL (ref 30.0–36.0)
MCV: 91.6 fL (ref 78.0–100.0)
RBC: 3.94 MIL/uL (ref 3.87–5.11)

## 2011-08-15 LAB — TROPONIN I: Troponin I: <0.3 ng/mL (ref <0.30)

## 2011-08-15 LAB — URINE MICROSCOPIC-ADD ON

## 2011-08-15 MED ORDER — ZOLPIDEM TARTRATE 5 MG PO TABS: 5.0000 mg | ORAL_TABLET | Freq: Every evening | ORAL | Status: DC | PRN

## 2011-08-15 MED ORDER — IOHEXOL 300 MG/ML  SOLN
75.0000 mL | Freq: Once | INTRAMUSCULAR | Status: AC | PRN
  Administered 2011-08-15: 75 mL via INTRAVENOUS

## 2011-08-15 NOTE — ED Notes (Signed)
Pt to department per EMS from home, reports that she has been feeling generally weak and tired. Denies any pain at this time. No neuro deficits present. Reports that she was here on Saturday with low sodium.

## 2011-08-15 NOTE — ED Provider Notes (Signed)
History     CSN: [id number] Arrival date & time: 08/15/2011  7:57 AM   First MD Initiated Contact with Patient 08/15/11 [phone]      Chief Complaint  Patient presents with  . Weakness    (Consider location/radiation/quality/duration/timing/severity/associated sxs/prior treatment) Patient is a 77 y.o. female presenting with weakness. The history is provided by the patient.  Weakness  Additional symptoms include weakness.  she woke up this morning and states that she felt weak, fatigued, and lightheaded. She has been having episodes of fatigue for the last year which have been gradually getting worse. She had been in the emergency department 2 days ago with weakness and palpitations and was found to be hyponatremic. This morning, symptoms seemed not to be improving, so she called members to come to the emergency department. It did not matter whether she was lying down, sitting, standing, or walking. Symptoms are moderate to severe and nothing made it better nothing made them worse. She is feeling significantly better since arriving in the emergency department. She denies chest pain, heaviness, tightness, pressure. She denies dyspnea, nausea, vomiting, diaphoresis.she has not done anything to try and treat herself. She is concerned that some of her fatigue over the last year might be related to her medication.  Past Medical History  Diagnosis Date  . Hypertension   . Thyroid disease   . Anemia   . Thrombocytopenia     Past Surgical History  Procedure Date  . Cholecystectomy   . Colon surgery     History reviewed. No pertinent family history.  History  Substance Use Topics  . Smoking status: Never Smoker   . Smokeless tobacco: Not on file  . Alcohol Use: No    OB History    Grav Para Term Preterm Abortions TAB SAB Ect Mult Living                  Review of Systems  Neurological: Positive for weakness.  All other systems reviewed and are negative.    Allergies  Review  of patient's allergies indicates no known allergies.  Home Medications   Current Outpatient Rx  Name Route Sig Dispense Refill  . AMLODIPINE BESYLATE 5 MG PO TABS Oral Take 5 mg by mouth daily.     . ASPIRIN EC 81 MG PO TBEC Oral Take 81 mg by mouth daily.      Marland Kitchen CALCIUM-VITAMIN D 250-125 MG-UNIT PO TABS Oral Take 1 tablet by mouth daily.      . OMEGA-3 FATTY ACIDS 1000 MG PO CAPS Oral Take 1 g by mouth daily.     Marland Kitchen HYPROMELLOSE 2.5 % OP SOLN Both Eyes Place 2 drops into both eyes daily as needed. For dry eyes     . LEVOTHYROXINE SODIUM 100 MCG PO TABS Oral Take 100 mcg by mouth daily.      Marland Kitchen LISINOPRIL-HYDROCHLOROTHIAZIDE 20-25 MG PO TABS Oral Take 1 tablet by mouth daily.      Marland Kitchen LORATADINE 10 MG PO TABS Oral Take 10 mg by mouth daily.      . MOMETASONE FUROATE 50 MCG/ACT NA SUSP Nasal Place 2 sprays into the nose daily as needed. For allergies    . ONE-DAILY MULTI VITAMINS PO TABS Oral Take 1 tablet by mouth daily.        BP 154/47  Pulse 59  Temp(Src) 98.5 F (36.9 C) (Oral)  Resp 16  SpO2 100%  Physical Exam  Nursing note and vitals reviewed. 77 year old female who is  resting comfortably and in no acute distress. Vital signs are significant for borderline bradycardia with heart rate of 59, and mild hypertension with blood pressure 154/47. Oxygen saturation is a satisfactory 100% on room air. Head is normocephalic and atraumatic. PERRLA, EOMI. Oropharynx is clear. Neck is supple without adenopathy or JVD. Lungs are clear without rales, wheezes, or rhonchi. Back is nontender and there is no CVA tenderness. Heart has regular rate and rhythm without murmur. There is no chest wall tenderness. Abdomen is soft, flat, nontender without masses or hepatosplenomegaly. Extremities have no cyanosis or edema, full range of motion present. Skin is warm and moist without rash. Neurologic: Mental status is normal, cranial nerves are intact, there are no focal motor or sensory deficits. Psychiatric: No  abnormalities of mood or affect.  ED Course  Procedures (including critical care time)   Labs Reviewed  CBC  DIFFERENTIAL  COMPREHENSIVE METABOLIC PANEL  TROPONIN I  CK TOTAL AND CKMB  D-DIMER, QUANTITATIVE  URINALYSIS, ROUTINE W REFLEX MICROSCOPIC   No results found. Results for orders placed during the hospital encounter of 08/15/11  CBC      Component Value Range   WBC 5.2  4.0 - 10.5 (K/uL)   RBC 3.94  3.87 - 5.11 (MIL/uL)   Hemoglobin 12.1  12.0 - 15.0 (g/dL)   HCT 16.1  09.6 - 04.5 (%)   MCV 91.6  78.0 - 100.0 (fL)   MCH 30.7  26.0 - 34.0 (pg)   MCHC 33.5  30.0 - 36.0 (g/dL)   RDW 40.9  81.1 - 91.4 (%)   Platelets 260  150 - 400 (K/uL)  DIFFERENTIAL      Component Value Range   Neutrophils Relative 68  43 - 77 (%)   Neutro Abs 3.6  1.7 - 7.7 (K/uL)   Lymphocytes Relative 21  12 - 46 (%)   Lymphs Abs 1.1  0.7 - 4.0 (K/uL)   Monocytes Relative 7  3 - 12 (%)   Monocytes Absolute 0.4  0.1 - 1.0 (K/uL)   Eosinophils Relative 3  0 - 5 (%)   Eosinophils Absolute 0.2  0.0 - 0.7 (K/uL)   Basophils Relative 1  0 - 1 (%)   Basophils Absolute 0.0  0.0 - 0.1 (K/uL)  COMPREHENSIVE METABOLIC PANEL      Component Value Range   Sodium 128 (*) 135 - 145 (mEq/L)   Potassium 4.2  3.5 - 5.1 (mEq/L)   Chloride 90 (*) 96 - 112 (mEq/L)   CO2 29  19 - 32 (mEq/L)   Glucose, Bld 104 (*) 70 - 99 (mg/dL)   BUN 13  6 - 23 (mg/dL)   Creatinine, Ser 7.82  0.50 - 1.10 (mg/dL)   Calcium 95.6  8.4 - 10.5 (mg/dL)   Total Protein 7.5  6.0 - 8.3 (g/dL)   Albumin 4.0  3.5 - 5.2 (g/dL)   AST 26  0 - 37 (U/L)   ALT 15  0 - 35 (U/L)   Alkaline Phosphatase 51  39 - 117 (U/L)   Total Bilirubin 0.4  0.3 - 1.2 (mg/dL)   GFR calc non Af Amer 56 (*) >90 (mL/min)   GFR calc Af Amer 64 (*) >90 (mL/min)  TROPONIN I      Component Value Range   Troponin I <0.30  <0.30 (ng/mL)  CK TOTAL AND CKMB      Component Value Range   Total CK 80  7 - 177 (U/L)   CK, MB 2.6  0.3 - 4.0 (ng/mL)   Relative Index  RELATIVE INDEX IS INVALID  0.0 - 2.5   D-DIMER, QUANTITATIVE      Component Value Range   D-Dimer, Quant 2.50 (*) 0.00 - 0.48 (ug/mL-FEU)  URINALYSIS, ROUTINE W REFLEX MICROSCOPIC      Component Value Range   Color, Urine YELLOW  YELLOW    APPearance CLEAR  CLEAR    Specific Gravity, Urine 1.006  1.005 - 1.030    pH 7.0  5.0 - 8.0    Glucose, UA NEGATIVE  NEGATIVE (mg/dL)   Hgb urine dipstick NEGATIVE  NEGATIVE    Bilirubin Urine NEGATIVE  NEGATIVE    Ketones, ur NEGATIVE  NEGATIVE (mg/dL)   Protein, ur NEGATIVE  NEGATIVE (mg/dL)   Urobilinogen, UA 0.2  0.0 - 1.0 (mg/dL)   Nitrite NEGATIVE  NEGATIVE    Leukocytes, UA SMALL (*) NEGATIVE   URINE MICROSCOPIC-ADD ON      Component Value Range   Squamous Epithelial / LPF RARE  RARE    WBC, UA 0-2  <3 (WBC/hpf)   RBC / HPF 0-2  <3 (RBC/hpf)   Dg Chest 2 View  08/15/2011  *RADIOLOGY REPORT*  Clinical Data: Weakness.  History of hypertension.  CHEST - 2 VIEW  Comparison: 08/02/2011.  Findings: There is hyperinflation present.  This is unchanged. There are no infiltrates or edematous changes.  The heart is normal in size.  The aorta is elongated and ectatic but unchanged.  There is no evidence for mediastinal or hilar adenopathy.  IMPRESSION: Diffuse hyperinflation - unchanged.  No acute findings.  Original Report Authenticated By: Rolla Plate, M.D.   Dg Chest 2 View  08/02/2011  *RADIOLOGY REPORT*  Clinical Data: Palpitations.  Dizziness.  Near syncope. Hypertension.  CHEST - 2 VIEW 08/02/2011:  Comparison: Two-view chest x-ray 09/18/2010 [facility] and 02/06/2007 [facility].  Findings: Cardiac silhouette upper normal in size, unchanged. Thoracic aorta tortuous atherosclerotic, unchanged.  Hilar and mediastinal contours otherwise unremarkable.  Lungs hyperinflated but clear.  No pleural effusions.  Visualized bony thorax intact.  IMPRESSION: Hyperinflation consistent with COPD and/or asthma.  No acute cardiopulmonary  disease.  Stable examination.  Original Report Authenticated By: Arnell Sieving, M.D.   Ct Angio Chest W/cm &/or Wo Cm  08/15/2011  *RADIOLOGY REPORT*  Clinical Data: Palpitations  CT ANGIOGRAPHY CHEST WITH CONTRAST  Technique:  Multidetector CT imaging of the chest was performed using the standard protocol during bolus administration of intravenous contrast.  Multiplanar CT image reconstructions including MIPs were obtained to evaluate the vascular anatomy.  Contrast: 75mL OMNIPAQUE IOHEXOL 300 MG/ML IV SOLN this  Comparison:   Site of service.  Findings:  Images of the thoracic inlet are unremarkable.  Central airways are patent.  Atherosclerotic calcifications of thoracic aorta are noted.  The study is of excellent technical quality.  No pulmonary embolus is noted.  Heart size is within normal limits.  No pericardial effusion is noted.  The visualized upper abdomen is unremarkable.  Sagittal images of the spine shows mild degenerative changes thoracic spine.  Atherosclerotic calcifications of the coronary arteries are noted.  Images of the lung parenchyma shows no acute infiltrate or pleural effusion.  Mild perihilar peribronchial thickening.  No pulmonary edema.  No pleural effusion is noted.  No mediastinal hematoma is noted.  No adenopathy.  Review of the MIP images confirms the above findings.  IMPRESSION:  1.  No acute infiltrate or pulmonary edema. 2.  No pulmonary embolus.  3.  Bilateral perihilar mild peribronchial thickening.  Original Report Authenticated By: Natasha Mead, M.D.      No diagnosis found.   Date: 08/15/2011  Rate: 52  Rhythm: sinus bradycardia  QRS Axis: normal  Intervals: normal  ST/T Wave abnormalities: normal  Conduction Disutrbances:none  Narrative Interpretation: Q waves in leads V1 and V2 consistent with old anteroseptal MI. When compared with ECG of 08/13/2011, no significant change is seen.  Old EKG Reviewed: unchanged  Workup is negative. I reviewed this with  the patient. Her hyponatremia is essentially the same, and not at a level that would be expected to cause problems. She does relate she is having some difficulty sleeping at night, and she will be given a prescription for Ambien to see if this is helpful for her. She is to  followup with her PCP to consider adjusting her medications. She also relates she has episodes where her heart races and I have suggested that she contact her physician about possibly getting a Holter monitor as an outpatient.  MDM  Weakness-etiology unclear. Consider electrolyte disturbance, it consider occult coronary artery disease, consider medication side effect.        Dione Booze, MD 08/15/11 [phone]

## 2011-08-21 ENCOUNTER — Encounter (HOSPITAL_COMMUNITY): Payer: Self-pay

## 2011-08-21 ENCOUNTER — Emergency Department (HOSPITAL_COMMUNITY)
Admission: EM | Admit: 2011-08-21 | Discharge: 2011-08-21 | Disposition: A | Discharge status: Home / Self Care | Payer: PRIVATE HEALTH INSURANCE | Attending: Emergency Medicine | Admitting: Emergency Medicine

## 2011-08-21 ENCOUNTER — Other Ambulatory Visit: Payer: Self-pay

## 2011-08-21 DIAGNOSIS — R002 Palpitations: Secondary | ICD-10-CM | POA: Insufficient documentation

## 2011-08-21 DIAGNOSIS — Z79899 Other long term (current) drug therapy: Secondary | ICD-10-CM | POA: Insufficient documentation

## 2011-08-21 DIAGNOSIS — R5381 Other malaise: Secondary | ICD-10-CM | POA: Insufficient documentation

## 2011-08-21 LAB — POCT I-STAT, CHEM 8
Chloride: 92 mEq/L — ABNORMAL LOW (ref 96–112)
HCT: 35 % — ABNORMAL LOW (ref 36.0–46.0)
Potassium: 4 mEq/L (ref 3.5–5.1)

## 2011-08-21 LAB — POCT I-STAT TROPONIN I: Troponin i, poc: 0 ng/mL (ref 0.00–0.08)

## 2011-08-21 NOTE — ED Provider Notes (Signed)
History     CSN: [id number]  Arrival date & time 08/21/11  4098   First MD Initiated Contact with Patient 08/21/11 [phone]      Chief Complaint  Patient presents with  . Irregular Heart Beat    (Consider location/radiation/quality/duration/timing/severity/associated sxs/prior treatment) The history is provided by the patient and a relative.   patient is a 77 year old female presents for irregular heartbeat has been intermittent for the last month patient was seen in emergency department for this 4 times previously this month. Patient says ir- regular heart rate started at 5 AM it was not rapid she does have this some history of weakness associated with it.  Denies chest pain shortness of breath. Has followup with cardiology already scheduled for January. Previous ED workups have been negative. Patient is not experiencing a sensation now.   Past Medical History  Diagnosis Date  . Hypertension   . Thyroid disease   . Anemia   . Thrombocytopenia     Past Surgical History  Procedure Date  . Cholecystectomy   . Colon surgery   . Knee reconstruction, medial patellar femoral ligament     History reviewed. No pertinent family history.  History  Substance Use Topics  . Smoking status: Never Smoker   . Smokeless tobacco: Not on file  . Alcohol Use: No    OB History    Grav Para Term Preterm Abortions TAB SAB Ect Mult Living                  Review of Systems  Constitutional: Positive for appetite change. Negative for fever and chills.  HENT: Negative for congestion and neck pain.   Eyes: Negative for visual disturbance.  Respiratory: Negative for cough and shortness of breath.   Cardiovascular: Negative for chest pain.  Gastrointestinal: Negative for nausea, vomiting and abdominal pain.  Genitourinary: Negative for dysuria.  Musculoskeletal: Negative for back pain.  Neurological: Positive for weakness. Negative for headaches.  Psychiatric/Behavioral: Negative for  confusion.    Allergies  Review of patient's allergies indicates no known allergies.  Home Medications   Current Outpatient Rx  Name Route Sig Dispense Refill  . AMLODIPINE BESYLATE 5 MG PO TABS Oral Take 5 mg by mouth daily.     . ASPIRIN EC 81 MG PO TBEC Oral Take 81 mg by mouth daily.      Marland Kitchen CALCIUM-VITAMIN D 250-125 MG-UNIT PO TABS Oral Take 1 tablet by mouth daily.      . OMEGA-3 FATTY ACIDS 1000 MG PO CAPS Oral Take 1 g by mouth daily.     Marland Kitchen HYPROMELLOSE 2.5 % OP SOLN Both Eyes Place 2 drops into both eyes daily as needed. For dry eyes     . LEVOTHYROXINE SODIUM 100 MCG PO TABS Oral Take 100 mcg by mouth daily.      Marland Kitchen LISINOPRIL-HYDROCHLOROTHIAZIDE 20-25 MG PO TABS Oral Take 0.5 tablets by mouth daily.     Marland Kitchen LORATADINE 10 MG PO TABS Oral Take 10 mg by mouth daily.      Marland Kitchen ONE-DAILY MULTI VITAMINS PO TABS Oral Take 1 tablet by mouth daily.      . MOMETASONE FUROATE 50 MCG/ACT NA SUSP Nasal Place 2 sprays into the nose daily as needed. For allergies      BP 126/37  Pulse 49  Temp(Src) 98.6 F (37 C) (Oral)  Resp 18  SpO2 100%  Physical Exam  Nursing note and vitals reviewed. Constitutional: She is oriented to person, place, and  time. She appears well-developed and well-nourished.  HENT:  Head: Normocephalic and atraumatic.  Mouth/Throat: Oropharynx is clear and moist.  Eyes: Conjunctivae and EOM are normal. Pupils are equal, round, and reactive to light.  Neck: Normal range of motion. Neck supple.  Cardiovascular: Normal rate, regular rhythm and normal heart sounds.   No murmur heard. Pulmonary/Chest: Effort normal and breath sounds normal.  Abdominal: Soft. Bowel sounds are normal. There is no tenderness.  Musculoskeletal: Normal range of motion. She exhibits no edema.  Neurological: She is alert and oriented to person, place, and time. No cranial nerve deficit. She exhibits normal muscle tone. Coordination normal.  Skin: Skin is warm. No rash noted.    ED Course    Procedures (including critical care time)  Labs Reviewed  POCT I-STAT, CHEM 8 - Abnormal; Notable for the following:    Sodium 129 (*)    Chloride 92 (*)    Glucose, Bld 113 (*)    Hemoglobin 11.9 (*)    HCT 35.0 (*)    All other components within normal limits  POCT I-STAT TROPONIN I  I-STAT, CHEM 8  I-STAT TROPONIN I   Results for orders placed during the hospital encounter of 08/21/11  POCT I-STAT, CHEM 8      Component Value Range   Sodium 129 (*) 135 - 145 (mEq/L)   Potassium 4.0  3.5 - 5.1 (mEq/L)   Chloride 92 (*) 96 - 112 (mEq/L)   BUN 13  6 - 23 (mg/dL)   Creatinine, Ser 1.19  0.50 - 1.10 (mg/dL)   Glucose, Bld 147 (*) 70 - 99 (mg/dL)   Calcium, Ion 8.29  5.62 - 1.32 (mmol/L)   TCO2 30  0 - 100 (mmol/L)   Hemoglobin 11.9 (*) 12.0 - 15.0 (g/dL)   HCT 13.0 (*) 86.5 - 46.0 (%)  POCT I-STAT TROPONIN I      Component Value Range   Troponin i, poc 0.00  0.00 - 0.08 (ng/mL)   Comment 3             Date: 08/21/2011  Rate: 64  Rhythm: normal sinus rhythm  QRS Axis: normal  Intervals: normal  ST/T Wave abnormalities: normal  Conduction Disutrbances:none  Narrative Interpretation:   Old EKG Reviewed: unchanged No significant change from 08/15/2011.   1. Heart palpitations       MDM  Patient presents with a feeling of strong heart eat not actually rapid heartbeat. She has already been seen 4 times for this complaint this month, today makes the fifth. No chest. General weakness feeling. Past labs are shown the mild hyponatremia. Today's EKG without any significant findings. Cardiac monitoring without arrhythmia. Today's basic labs without changes. Troponin negative. Patient already has followup with cardiology beginning in January. Patient may benefit from a cardiac Holter monitoring however doubt any serious arrhythmia presents as we have been unable to identify it and it doesn't seem to be a rapid heart.        Shelda Jakes, MD 08/21/11 1019

## 2011-08-21 NOTE — ED Notes (Signed)
EKG was done on arrival

## 2011-08-21 NOTE — ED Notes (Signed)
Pt with c/om irregular heart beat for last month, worse this am, starting at 5am, hypertensive, decreased to 1/2 lisinopril at night

## 2011-09-06 ENCOUNTER — Encounter: Payer: Self-pay | Admitting: Cardiovascular Disease

## 2011-09-06 ENCOUNTER — Ambulatory Visit (INDEPENDENT_AMBULATORY_CARE_PROVIDER_SITE_OTHER): Payer: PRIVATE HEALTH INSURANCE | Admitting: Cardiovascular Disease

## 2011-09-06 VITALS — BP 161/70 | HR 70 | Ht 61.0 in | Wt 144.6 lb

## 2011-09-06 DIAGNOSIS — R002 Palpitations: Secondary | ICD-10-CM

## 2011-09-06 DIAGNOSIS — E039 Hypothyroidism, unspecified: Secondary | ICD-10-CM

## 2011-09-06 DIAGNOSIS — I10 Essential (primary) hypertension: Secondary | ICD-10-CM

## 2011-09-06 LAB — TSH: TSH: 0.52 u[IU]/mL (ref 0.35–5.50)

## 2011-09-06 MED ORDER — CARVEDILOL 12.5 MG PO TABS: 12.5000 mg | ORAL_TABLET | Freq: Two times a day (BID) | ORAL | Status: DC

## 2011-09-06 MED ORDER — LISINOPRIL 20 MG PO TABS: 20.0000 mg | ORAL_TABLET | Freq: Every day | ORAL | Status: DC

## 2011-09-06 NOTE — Assessment & Plan Note (Signed)
She's had lots of palpitations. I do not see a recent TSH level. We'll check a TSH today.

## 2011-09-06 NOTE — Progress Notes (Signed)
Lisa Sawyer Date of Birth  [birth year] [facility]     [place] Office  [address]    [address]   [address] [place], [place]  [address]    [place], [place]  [address] [phone]  Fax  [phone]  [phone]  Fax [phone]   History of Present Illness:  Ms Thielke is a 78 yo with a Hx of HTN, thrombocytopenia, hypothyroidism,  and a breast mass. Prazosin for further evaluation of her palpitations.  She's been to the emergency room 4 different times over the past month or so.  She's had negative cardiac enzymes during the emergency room visits. On one of her visit she had a mildly elevated d-dimer level. A CT angiogram was negative for pulmonary embolus.  I do not see a TSH drawn during any of the emergency room visits.  Chest palpitations typically at night. She denies any chest pain or shortness breath. She feels that her heart is beating very hard.  She  feels cold frequently.  He does have irritable bowel syndrome but does not consistently have diarrhea or constipation.  Current Outpatient Prescriptions on File Prior to Visit  Medication Sig Dispense Refill  . amLODipine (NORVASC) 5 MG tablet Take 5 mg by mouth daily.       Marland Kitchen aspirin EC 81 MG tablet Take 81 mg by mouth daily.        . calcium-vitamin D (OSCAL) 250-125 MG-UNIT per tablet Take 1 tablet by mouth daily.        . fish oil-omega-3 fatty acids 1000 MG capsule Take 1 g by mouth daily.       Marland Kitchen levothyroxine (SYNTHROID, LEVOTHROID) 100 MCG tablet Take 100 mcg by mouth daily.        Marland Kitchen lisinopril-hydrochlorothiazide (PRINZIDE,ZESTORETIC) 20-25 MG per tablet Take 0.5 tablets by mouth daily.       Marland Kitchen loratadine (CLARITIN) 10 MG tablet Take 10 mg by mouth daily.        . mometasone (NASONEX) 50 MCG/ACT nasal spray Place 2 sprays into the nose daily as needed. For allergies      . Multiple Vitamin (MULTIVITAMIN) tablet Take 1 tablet by mouth daily.          Allergies  Allergen Reactions  . Naproxen      Past Medical History  Diagnosis Date  . Hypertension   . Thyroid disease   . Anemia   . Thrombocytopenia   . Osteoarthritis     left knee  . Hypothyroidism   . Hypercholesterolemia   . History of breast cancer   . Idiopathic thrombocytopenic purpura     A questionable history of idiopathic thrombocytopenic purpura  . Acute blood loss anemia     secondary to surgery, requiring blood transfusion  . Hypokalemia     treated  . Leukocytosis   . Pyrexia     Past Surgical History  Procedure Date  . Cholecystectomy   . Colon surgery   . Knee reconstruction, medial patellar femoral ligament     History  Smoking status  . Never Smoker   Smokeless tobacco  . Not on file    History  Alcohol Use No    No family history on file.  Reviw of Systems:  Reviewed in the HPI.  All other systems are negative.  Physical Exam: BP 161/70  Pulse 70  Ht 5\' 1"  (1.549 m)  Wt 144 lb 9.6 oz (65.59 kg)  BMI 27.32 kg/m2 The patient  is alert and oriented x 3.  The mood and affect are normal.   Skin: warm and dry.  Color is normal.    HEENT:   Normocephalic/atraumatic. Her mucous membranes are moist. Neck is supple. Carotids are 2+ without bruits. There is no JVD.  Lungs: Lungs are clear to auscultation.   Heart: Regular rate S1-S2. There is a very soft systolic murmur. There is no S3 gallop.    Abdomen: His good bowel sounds. There is no hepatosplenomegaly.  Extremities:  No clubbing cyanosis or edema.  Neuro:  Gait is normal. Her exam is nonfocal.    ECG: Normal sinus rhythm.  Assessment / Plan:

## 2011-09-06 NOTE — Patient Instructions (Signed)
Your physician recommends that you schedule a follow-up appointment in: 2 MONTHS NEED LAB DRAW NOT FASTING/ BMP  Your physician recommends that you return for lab work in: TODAY TSH  Your physician has recommended you make the following change in your medication  STOP LISINOPRIL/HCTZ   START COREG 12.5 MG ONE TABLET TWO TIMES A DAY 12 HOURS APART START LISINOPRIL 20 MG A DAY

## 2011-09-06 NOTE — Assessment & Plan Note (Signed)
She seems to have a lot of palpitations at night. I suspect that she's having some premature ventricular contractions or perhaps premature atrial contractions. We'll check a TSH. I suspect the carvedilol will help. We will consider giving her propranolol on an as-needed basis if she continues to have episodes of palpitations after being started on carvedilol.

## 2011-09-06 NOTE — Assessment & Plan Note (Signed)
Her blood pressure remains elevated. She has hyponatremia. I suspect that she's drinking too much free water.  We will start her on carvedilol 12.5 mg twice a day. I'll discontinue the lisinopril/HCTZ and will start her back on plain lisinopril 20 mg daily. She's only taking HCTZ 12.5 mg a day and she continues to have a low-sodium level.  I've asked her to make sure that she does have any excessive salt. I'll see her back in several months the

## 2011-09-21 ENCOUNTER — Telehealth: Payer: Self-pay | Admitting: Cardiovascular Disease

## 2011-09-21 DIAGNOSIS — I10 Essential (primary) hypertension: Secondary | ICD-10-CM

## 2011-09-21 DIAGNOSIS — R002 Palpitations: Secondary | ICD-10-CM

## 2011-09-21 NOTE — Telephone Encounter (Signed)
Decrease coreg to 1/2 the previous dose ( still BID) This should help

## 2011-09-21 NOTE — Telephone Encounter (Signed)
Pt c/o not feeling well since start of new med coreg 09/06/11. Palpitations have been better but has times when feels whoozie or dizzy after med taken, wonders if she is having panicky feelings or is due to med?   bp been 106/78, 130/66, 120/65, 137/65 and current is 181/72 p 62. Pt pulse been 59, 54, 63, 60. She wanted to know if she could take half the amt of coreg. I told to take half this evening and in am and i would see what Dr Elease Hashimoto would like to do, please advise.

## 2011-09-21 NOTE — Telephone Encounter (Signed)
Pt has questions and concerns about medication and she wants to discuss everything when you call

## 2011-09-22 MED ORDER — CARVEDILOL 6.25 MG PO TABS: 6.2500 mg | ORAL_TABLET | Freq: Two times a day (BID) | ORAL | Status: DC

## 2011-09-22 NOTE — Telephone Encounter (Signed)
Left msg, pt to take coreg at have of usual dose, new dose 6.25 mg bid, pt called msg left with office number given.

## 2011-09-28 ENCOUNTER — Other Ambulatory Visit: Payer: Self-pay

## 2011-09-28 ENCOUNTER — Emergency Department (HOSPITAL_COMMUNITY)
Admission: EM | Admit: 2011-09-28 | Discharge: 2011-09-28 | Disposition: A | Discharge status: Home / Self Care | Payer: PRIVATE HEALTH INSURANCE | Attending: Emergency Medicine | Admitting: Emergency Medicine

## 2011-09-28 DIAGNOSIS — E7800 Pure hypercholesterolemia, unspecified: Secondary | ICD-10-CM | POA: Insufficient documentation

## 2011-09-28 DIAGNOSIS — Z7982 Long term (current) use of aspirin: Secondary | ICD-10-CM | POA: Insufficient documentation

## 2011-09-28 DIAGNOSIS — M25473 Effusion, unspecified ankle: Secondary | ICD-10-CM | POA: Insufficient documentation

## 2011-09-28 DIAGNOSIS — I498 Other specified cardiac arrhythmias: Secondary | ICD-10-CM | POA: Insufficient documentation

## 2011-09-28 DIAGNOSIS — Z853 Personal history of malignant neoplasm of breast: Secondary | ICD-10-CM | POA: Insufficient documentation

## 2011-09-28 DIAGNOSIS — Z79899 Other long term (current) drug therapy: Secondary | ICD-10-CM | POA: Insufficient documentation

## 2011-09-28 DIAGNOSIS — R001 Bradycardia, unspecified: Secondary | ICD-10-CM

## 2011-09-28 DIAGNOSIS — I10 Essential (primary) hypertension: Secondary | ICD-10-CM | POA: Insufficient documentation

## 2011-09-28 DIAGNOSIS — E039 Hypothyroidism, unspecified: Secondary | ICD-10-CM | POA: Insufficient documentation

## 2011-09-28 DIAGNOSIS — E079 Disorder of thyroid, unspecified: Secondary | ICD-10-CM | POA: Insufficient documentation

## 2011-09-28 DIAGNOSIS — M1990 Unspecified osteoarthritis, unspecified site: Secondary | ICD-10-CM | POA: Insufficient documentation

## 2011-09-28 DIAGNOSIS — M25476 Effusion, unspecified foot: Secondary | ICD-10-CM | POA: Insufficient documentation

## 2011-09-28 DIAGNOSIS — R6883 Chills (without fever): Secondary | ICD-10-CM | POA: Insufficient documentation

## 2011-09-28 DIAGNOSIS — R5381 Other malaise: Secondary | ICD-10-CM | POA: Insufficient documentation

## 2011-09-28 LAB — URINE MICROSCOPIC-ADD ON

## 2011-09-28 LAB — COMPREHENSIVE METABOLIC PANEL
ALT: 17 U/L (ref 0–35)
AST: 23 U/L (ref 0–37)
Albumin: 3.7 g/dL (ref 3.5–5.2)
Alkaline Phosphatase: 51 U/L (ref 39–117)
Glucose, Bld: 94 mg/dL (ref 70–99)
Potassium: 4.1 mEq/L (ref 3.5–5.1)
Sodium: 140 mEq/L (ref 135–145)
Total Protein: 7.2 g/dL (ref 6.0–8.3)

## 2011-09-28 LAB — URINALYSIS, ROUTINE W REFLEX MICROSCOPIC
Glucose, UA: NEGATIVE mg/dL
Hgb urine dipstick: NEGATIVE
Ketones, ur: NEGATIVE mg/dL
Protein, ur: NEGATIVE mg/dL
pH: 6.5 (ref 5.0–8.0)

## 2011-09-28 LAB — CBC
HCT: 35 % — ABNORMAL LOW (ref 36.0–46.0)
MCH: 30.5 pg (ref 26.0–34.0)
MCHC: 32.6 g/dL (ref 30.0–36.0)
MCV: 93.6 fL (ref 78.0–100.0)
RDW: 13.4 % (ref 11.5–15.5)

## 2011-09-28 NOTE — ED Provider Notes (Signed)
History     CSN: [id number]  Arrival date & time 09/28/11  1215   First MD Initiated Contact with Patient 09/28/11 1222      Chief Complaint  Patient presents with  . Weakness    States was in [facility] 1 month ago to manage Coreg. Today started with chills and weakness. Goes to [facility] but [facility] for Coreg management.    (Consider location/radiation/quality/duration/timing/severity/associated sxs/prior treatment) Patient is a 78 y.o. female presenting with weakness. The history is provided by the patient.  Weakness Primary symptoms do not include headaches or fever.  Additional symptoms include weakness. Additional symptoms do not include neck stiffness.  pt states in past few days generally weak. No focal numbness or weakness. Symptoms constant. Worse when stands. No syncope. No current or recent cp or discomfort. No palpitations or irregular heartbeat. Denies any pain. No headache. No cp. No sob. No cough or uri c/o. No abd pain. No nvd. Eating normally including today. No urinary symptoms. No recent trauma or fall. States her coreg dose was decreased last week due to same symptoms. Hx anemia. Denies recent blood loss, rectal bleeding or melena. Other than decreased in coreg dose no other recent new meds or change in meds.   Past Medical History  Diagnosis Date  . Hypertension   . Thyroid disease   . Anemia   . Thrombocytopenia   . Osteoarthritis     left knee  . Hypothyroidism   . Hypercholesterolemia   . History of breast cancer   . Idiopathic thrombocytopenic purpura     A questionable history of idiopathic thrombocytopenic purpura  . Acute blood loss anemia     secondary to surgery, requiring blood transfusion  . Hypokalemia     treated  . Leukocytosis   . Pyrexia     Past Surgical History  Procedure Date  . Cholecystectomy   . Colon surgery   . Knee reconstruction, medial patellar femoral ligament     No family history on file.  History  Substance Use  Topics  . Smoking status: Never Smoker   . Smokeless tobacco: Not on file  . Alcohol Use: No    OB History    Grav Para Term Preterm Abortions TAB SAB Ect Mult Living                  Review of Systems  Constitutional: Negative for fever and chills.  HENT: Negative for sore throat, neck pain and neck stiffness.   Eyes: Negative for redness.  Respiratory: Negative for shortness of breath.   Cardiovascular: Negative for chest pain.  Gastrointestinal: Negative for abdominal pain.  Genitourinary: Negative for flank pain.  Musculoskeletal: Negative for back pain.  Skin: Negative for rash.  Neurological: Positive for weakness. Negative for headaches.  Hematological: Does not bruise/bleed easily.  Psychiatric/Behavioral: Negative for confusion.    Allergies  Naproxen  Home Medications   Current Outpatient Rx  Name Route Sig Dispense Refill  . TYLENOL PO Oral Take 325 mg by mouth as needed.      Marland Kitchen AMLODIPINE BESYLATE 5 MG PO TABS Oral Take 5 mg by mouth daily.     . ASPIRIN EC 81 MG PO TBEC Oral Take 81 mg by mouth daily.      Marland Kitchen CALCIUM-VITAMIN D 250-125 MG-UNIT PO TABS Oral Take 1 tablet by mouth daily.      Marland Kitchen CARVEDILOL 6.25 MG PO TABS Oral Take 1 tablet (6.25 mg total) by mouth 2 (two)  times daily with a meal. 60 tablet 5  . OMEGA-3 FATTY ACIDS 1000 MG PO CAPS Oral Take 1 g by mouth daily.     Marland Kitchen LEVOTHYROXINE SODIUM 100 MCG PO TABS Oral Take 100 mcg by mouth daily.      Marland Kitchen LISINOPRIL 20 MG PO TABS Oral Take 1 tablet (20 mg total) by mouth daily. 30 tablet 11  . LORATADINE 10 MG PO TABS Oral Take 10 mg by mouth daily.      . MOMETASONE FUROATE 50 MCG/ACT NA SUSP Nasal Place 2 sprays into the nose daily as needed. For allergies    . ONE-DAILY MULTI VITAMINS PO TABS Oral Take 1 tablet by mouth daily.        BP 209/67  Pulse 62  Temp(Src) 98.3 F (36.8 C) (Oral)  Resp 18  SpO2 100%  Physical Exam  Nursing note and vitals reviewed. Constitutional: She is oriented to  person, place, and time. She appears well-developed and well-nourished. No distress.  HENT:  Head: Atraumatic.  Mouth/Throat: Oropharynx is clear and moist.  Eyes: Conjunctivae are normal. Pupils are equal, round, and reactive to light. No scleral icterus.  Neck: Normal range of motion. Neck supple. No tracheal deviation present. No thyromegaly present.  Cardiovascular: Normal rate, regular rhythm, normal heart sounds and intact distal pulses.   Pulmonary/Chest: Effort normal and breath sounds normal. No respiratory distress.  Abdominal: Soft. Normal appearance and bowel sounds are normal. She exhibits no distension and no mass. There is no tenderness. There is no rebound and no guarding.  Musculoskeletal: She exhibits no tenderness.       Mild bil ankle edema, symmetric  Neurological: She is alert and oriented to person, place, and time.       Motor intact bil.   Skin: Skin is warm and dry. No rash noted.  Psychiatric: She has a normal mood and affect.    ED Course  Procedures (including critical care time)   Labs Reviewed  COMPREHENSIVE METABOLIC PANEL  CBC  CARDIAC PANEL(CRET KIN+CKTOT+MB+TROPI)  URINE CULTURE  SAMPLE TO BLOOD BANK   Results for orders placed during the hospital encounter of 09/28/11  COMPREHENSIVE METABOLIC PANEL      Component Value Range   Sodium 140  135 - 145 (mEq/L)   Potassium 4.1  3.5 - 5.1 (mEq/L)   Chloride 101  96 - 112 (mEq/L)   CO2 26  19 - 32 (mEq/L)   Glucose, Bld 94  70 - 99 (mg/dL)   BUN 14  6 - 23 (mg/dL)   Creatinine, Ser 4.09  0.50 - 1.10 (mg/dL)   Calcium 81.1  8.4 - 10.5 (mg/dL)   Total Protein 7.2  6.0 - 8.3 (g/dL)   Albumin 3.7  3.5 - 5.2 (g/dL)   AST 23  0 - 37 (U/L)   ALT 17  0 - 35 (U/L)   Alkaline Phosphatase 51  39 - 117 (U/L)   Total Bilirubin 0.3  0.3 - 1.2 (mg/dL)   GFR calc non Af Amer 50 (*) >90 (mL/min)   GFR calc Af Amer 58 (*) >90 (mL/min)  CBC      Component Value Range   WBC 5.3  4.0 - 10.5 (K/uL)   RBC 3.74  (*) 3.87 - 5.11 (MIL/uL)   Hemoglobin 11.4 (*) 12.0 - 15.0 (g/dL)   HCT 91.4 (*) 78.2 - 46.0 (%)   MCV 93.6  78.0 - 100.0 (fL)   MCH 30.5  26.0 - 34.0 (pg)  MCHC 32.6  30.0 - 36.0 (g/dL)   RDW 11.9  14.7 - 82.9 (%)   Platelets 207  150 - 400 (K/uL)  CARDIAC PANEL(CRET KIN+CKTOT+MB+TROPI)      Component Value Range   Total CK 58  7 - 177 (U/L)   CK, MB 1.9  0.3 - 4.0 (ng/mL)   Troponin I <0.30  <0.30 (ng/mL)   Relative Index RELATIVE INDEX IS INVALID  0.0 - 2.5   SAMPLE TO BLOOD BANK      Component Value Range   Blood Bank Specimen SAMPLE AVAILABLE FOR TESTING     Sample Expiration 10/01/2011    URINALYSIS, ROUTINE W REFLEX MICROSCOPIC      Component Value Range   Color, Urine YELLOW  YELLOW    APPearance CLEAR  CLEAR    Specific Gravity, Urine 1.008  1.005 - 1.030    pH 6.5  5.0 - 8.0    Glucose, UA NEGATIVE  NEGATIVE (mg/dL)   Hgb urine dipstick NEGATIVE  NEGATIVE    Bilirubin Urine NEGATIVE  NEGATIVE    Ketones, ur NEGATIVE  NEGATIVE (mg/dL)   Protein, ur NEGATIVE  NEGATIVE (mg/dL)   Urobilinogen, UA 0.2  0.0 - 1.0 (mg/dL)   Nitrite NEGATIVE  NEGATIVE    Leukocytes, UA TRACE (*) NEGATIVE   URINE MICROSCOPIC-ADD ON      Component Value Range   Squamous Epithelial / LPF FEW (*) RARE    WBC, UA 0-2  <3 (WBC/hpf)   Bacteria, UA FEW (*) RARE       MDM  Iv ns. Labs.    Date: 09/28/2011  Rate: 52  Rhythm: sinus bradycardia  QRS Axis: normal  Intervals: normal  ST/T Wave abnormalities: normal  Conduction Disutrbances:none  Narrative Interpretation:   Old EKG Reviewed: unchanged    Recheck pt. No c/o of pain, no fever or chills. No current faintness/dizziness. pts hr on monitor as low as 46, but tends to stay in low 50s - ?relation to current symptoms. States started no coreg last month and that dose recently decreased.  Will discussed w pts cardiologist.   Discussed pt w [facility] pa - will d/c coreg and have pt follow up closely with them this week - they state  they will have office call pt with an appointment.      Suzi Roots, MD 09/28/11 [phone]

## 2011-09-28 NOTE — ED Notes (Signed)
Pt c/o weakness, chills, dizziness this am. Has been going to [facility] cardiology over the last month having Coreg adjusted. Has recently had Coreg reduced by half. States sometimes has pressure in the top of her head. Daughter at bedside.

## 2011-09-28 NOTE — ED Notes (Signed)
AVW:[id number]<XB> Expected date:09/28/11<BR> Expected time:12:14 PM<BR> Means of arrival:Ambulance<BR> Comments:<BR>

## 2011-09-30 ENCOUNTER — Ambulatory Visit (INDEPENDENT_AMBULATORY_CARE_PROVIDER_SITE_OTHER): Payer: PRIVATE HEALTH INSURANCE | Admitting: Physician Assistant

## 2011-09-30 ENCOUNTER — Other Ambulatory Visit: Payer: Self-pay | Admitting: *Deleted

## 2011-09-30 ENCOUNTER — Encounter: Payer: Self-pay | Admitting: Physician Assistant

## 2011-09-30 DIAGNOSIS — R002 Palpitations: Secondary | ICD-10-CM

## 2011-09-30 DIAGNOSIS — I10 Essential (primary) hypertension: Secondary | ICD-10-CM

## 2011-09-30 DIAGNOSIS — R0602 Shortness of breath: Secondary | ICD-10-CM | POA: Insufficient documentation

## 2011-09-30 LAB — URINE CULTURE: Culture  Setup Time: 201301310115

## 2011-09-30 MED ORDER — PROPRANOLOL HCL 10 MG PO TABS: 10.0000 mg | ORAL_TABLET | Freq: Every day | ORAL | Status: DC | PRN

## 2011-09-30 MED ORDER — AMLODIPINE BESYLATE 10 MG PO TABS: 10.0000 mg | ORAL_TABLET | Freq: Every day | ORAL | Status: DC

## 2011-09-30 NOTE — Progress Notes (Signed)
[address] [place], [place]  [address] Phone: [phone] Fax:  [phone]  Date:  09/30/2011   Name:  Lisa Sawyer       DOB:  [birth year] MRN:  [id number]  PCP:  Dala Dock Primary Cardiologist:  Dr. Delane Ginger  Primary Electrophysiologist:  None    History of Present Illness: Lisa Sawyer is a 78 y.o. female who presents for follow up after a visit to the ED.  She recently established with Dr. Delane Ginger on 09/06/11.  She has a history of hypertension, thrombocytopenia, hypothyroidism, hyperlipidemia, breast cancer, ITP (?).  She has a history of palpitations.  She has been to the emergency room on 4 different occasions in the past month or so.  Prior visits to the emergency room and healed and negative cardiac markers.  A mildly elevated d-dimer prompted a CT angiogram which was negative for pulmonary emboli.  TSH 09/06/11: 0.52.  She had been previously noted to have hyponatremia.  At her visit on 1/8, she was started on carvedilol 12.5 mg twice a day.  HCTZ was discontinued and she was kept on lisinopril 20 mg a day.  She went to the emergency room 1/30 with complaints of weakness.  Her heart rate was noted to be in the 40s.  I reviewed the ECG which demonstrated sinus bradycardia at a heart rate of 52.  ER labs: Potassium 4.1, sodium 140, BUN 14, creatinine 1.05, ALT 17, hemoglobin 11.4, troponin negative.  The ER physician discussed the case with one of our PAs and the patient was advised to stop her Coreg and followup in the office.  She was unable to tolerate the Coreg.  She felt poorly with this.  She continues to have significant palpitations.  She has significant stress in her life.  She describes her palpitations as a rapid heartbeat as well as a skipping sensation.  She feels lightheaded and (?)  Near syncopal at times.  She denies syncope.  She denies chest discomfort.  However, she does not dyspnea on exertion.  She probably describes class IIb  symptoms.  She denies PND.  She denies significant edema.  She likes to sleep on one to 2 pillows, however she denies orthopnea.  Past Medical History  Diagnosis Date  . Hypertension   . Thyroid disease   . Anemia   . Thrombocytopenia   . Osteoarthritis     left knee  . Hypothyroidism   . Hypercholesterolemia   . History of breast cancer   . Idiopathic thrombocytopenic purpura     A questionable history of idiopathic thrombocytopenic purpura  . Acute blood loss anemia     secondary to surgery, requiring blood transfusion  . Hypokalemia     treated  . Leukocytosis   . Pyrexia     Current Outpatient Prescriptions  Medication Sig Dispense Refill  . acetaminophen (TYLENOL) 500 MG tablet Take 500 mg by mouth every 6 (six) hours as needed. For pain.      Marland Kitchen amLODipine (NORVASC) 5 MG tablet Take 5 mg by mouth daily.       Marland Kitchen aspirin EC 81 MG tablet Take 81 mg by mouth daily.        . calcium-vitamin D (OSCAL) 250-125 MG-UNIT per tablet Take 1 tablet by mouth daily.        . fish oil-omega-3 fatty acids 1000 MG capsule Take 1 g by mouth daily.       Marland Kitchen levothyroxine (SYNTHROID, LEVOTHROID) 100  MCG tablet Take 100 mcg by mouth daily.        Marland Kitchen lisinopril (PRINIVIL,ZESTRIL) 20 MG tablet Take 1 tablet (20 mg total) by mouth daily.  30 tablet  11  . loratadine (CLARITIN) 10 MG tablet Take 10 mg by mouth daily.        . Multiple Vitamin (MULITIVITAMIN WITH MINERALS) TABS Take 1 tablet by mouth daily.      . cholecalciferol (VITAMIN D) 1000 UNITS tablet Take 1,000 Units by mouth daily.        Allergies: Allergies  Allergen Reactions  . Naproxen     History  Substance Use Topics  . Smoking status: Never Smoker   . Smokeless tobacco: Not on file  . Alcohol Use: No     Family history:  Sister had a myocardial infarction in her 65s, mother had angina but died of cancer  ROS:  Please see the history of present illness.   She has lots of stress in her life.  She has recent headaches.  She  has chills.  She has loose stools.  She notes belching and water brash symptoms.  She notes increased postnasal drip.  All other systems reviewed and negative.   PHYSICAL EXAM: VS:  BP 156/60  Pulse 62  Resp 18  Ht 5\' 1"  (1.549 m)  Wt 147 lb (66.679 kg)  BMI 27.78 kg/m2 Well nourished, well developed, in no acute distress HEENT: normal Neck: no JVD Vascular: No carotid bruits Cardiac:  normal S1, S2; RRR; no murmur Lungs:  clear to auscultation bilaterally, no wheezing, rhonchi or rales Abd: soft, nontender, no hepatomegaly Ext: Trace bilateral edema Skin: warm and dry Neuro:  CNs 2-12 intact, no focal abnormalities noted Psych: Somewhat anxious  EKG:  Sinus rhythm, heart rate 62, Normal axis, poor R-wave progression, No change from prior  ASSESSMENT AND PLAN:

## 2011-09-30 NOTE — Assessment & Plan Note (Signed)
Etiology unclear.  Obtain echocardiogram as noted.  She has hypertension and there is a question of diet controlled diabetes.  She has risk factors.  I will also set her up for a stress echocardiogram.  Follow up with Dr. Delane Ginger in 6 weeks.

## 2011-09-30 NOTE — Patient Instructions (Signed)
Your physician recommends that you schedule a follow-up appointment in: 6 WEEKS WITH DR. Elease Hashimoto  Your physician has requested that you have an echocardiogram DX 786.05 SOB. Echocardiography is a painless test that uses sound waves to create images of your heart. It provides your doctor with information about the size and shape of your heart and how well your heart's chambers and valves are working. This procedure takes approximately one hour. There are no restrictions for this procedure.  Your physician has requested that you have a stress echocardiogram DX 786.05 SOB. For further information please visit [email or web address] Please follow instruction sheet as given.  Your physician has recommended that you wear an event monitor DX 785.1 PALPITATIONS. Event monitors are medical devices that record the heart's electrical activity. Doctors most often [place] these monitors to diagnose arrhythmias. Arrhythmias are problems with the speed or rhythm of the heartbeat. The monitor is a small, portable device. You can wear one while you do your normal daily activities. This is usually used to diagnose what is causing palpitations/syncope (passing out).  Your physician has recommended you make the following change in your medication: START PROPRANOLOL 10 MG DAILY AS NEEDED FOR PALPITATIONS; INCREASE AMLODIPINE 10 MG DAILY NEW RX SENT IN TODAY FOR BOTH MEDICATIONS

## 2011-09-30 NOTE — Assessment & Plan Note (Signed)
Needs better control.  Her blood pressure numbers at home actually looked better.  I suspect she has a component of whitecoat hypertension.  Increase amlodipine to 10 mg a day.

## 2011-09-30 NOTE — Assessment & Plan Note (Signed)
She could not tolerate Coreg.  This will be discontinued.  She likely has PVCs related to anxiety.  She has made several trips to the emergency room now.  I will set her up with an event monitor.  I will also set her up with an echocardiogram.  I have prescribed propranolol 10 mg daily p.r.n. Palpitations.  We discussed how to use this.  Followup with  Dr. Delane Ginger in 6 weeks.

## 2011-10-01 DIAGNOSIS — R002 Palpitations: Secondary | ICD-10-CM

## 2011-10-03 ENCOUNTER — Telehealth: Payer: Self-pay | Admitting: *Deleted

## 2011-10-03 NOTE — Telephone Encounter (Signed)
PT INFORMED HER ECARDIO IS MONITORING AND WHAT SHE HAS BEEN FEELING TO DATE ARE PVC'S EXPLAINED TO HER THAT THEY ARE HARMLESS. PT VERBALIZED UNDERSTANDING.

## 2011-10-03 NOTE — Telephone Encounter (Signed)
Message copied by Antony Odea on Mon Oct 03, 2011  2:26 PM ------      Message from: Mariane Masters D      Created: Fri Sep 30, 2011 11:33 AM      Regarding: MONITOR       09/30/11 Forward to Windell Moulding.

## 2011-10-06 ENCOUNTER — Ambulatory Visit (HOSPITAL_COMMUNITY): Payer: PRIVATE HEALTH INSURANCE | Attending: Cardiovascular Disease

## 2011-10-06 DIAGNOSIS — R002 Palpitations: Secondary | ICD-10-CM | POA: Insufficient documentation

## 2011-10-06 DIAGNOSIS — R0602 Shortness of breath: Secondary | ICD-10-CM

## 2011-10-06 DIAGNOSIS — R0609 Other forms of dyspnea: Secondary | ICD-10-CM | POA: Insufficient documentation

## 2011-10-06 DIAGNOSIS — R0989 Other specified symptoms and signs involving the circulatory and respiratory systems: Secondary | ICD-10-CM | POA: Insufficient documentation

## 2011-10-06 DIAGNOSIS — I10 Essential (primary) hypertension: Secondary | ICD-10-CM

## 2011-10-06 DIAGNOSIS — E785 Hyperlipidemia, unspecified: Secondary | ICD-10-CM | POA: Insufficient documentation

## 2011-10-06 DIAGNOSIS — I059 Rheumatic mitral valve disease, unspecified: Secondary | ICD-10-CM

## 2011-10-10 ENCOUNTER — Other Ambulatory Visit: Payer: Self-pay | Admitting: Cardiovascular Disease

## 2011-10-11 ENCOUNTER — Other Ambulatory Visit (HOSPITAL_COMMUNITY): Payer: PRIVATE HEALTH INSURANCE | Admitting: Radiology

## 2011-10-11 ENCOUNTER — Telehealth: Payer: Self-pay | Admitting: *Deleted

## 2011-10-11 NOTE — Progress Notes (Signed)
Opened in error

## 2011-10-11 NOTE — Telephone Encounter (Signed)
Message copied by Antony Odea on Tue Oct 11, 2011  1:55 PM ------      Message from: Connye Burkitt      Created: Mon Oct 10, 2011 12:11 PM      Regarding: stress echo       Provider (ins denied stress echo. approved 2d echo only)patient had 2-d echo on 10/06/11

## 2011-10-11 NOTE — Progress Notes (Signed)
Addended by: Antony Odea on: 10/11/2011 01:52 PM   Modules accepted: Orders

## 2011-10-11 NOTE — Telephone Encounter (Signed)
Removed stress echo on an addendum

## 2011-11-01 ENCOUNTER — Telehealth: Payer: Self-pay | Admitting: *Deleted

## 2011-11-01 NOTE — Telephone Encounter (Signed)
Called pt with monitor results, still c/o dizziness//has app in two days, pt will keep app for f/u

## 2011-11-03 ENCOUNTER — Ambulatory Visit (INDEPENDENT_AMBULATORY_CARE_PROVIDER_SITE_OTHER): Payer: PRIVATE HEALTH INSURANCE | Admitting: Cardiovascular Disease

## 2011-11-03 ENCOUNTER — Encounter: Payer: Self-pay | Admitting: Cardiovascular Disease

## 2011-11-03 VITALS — BP 152/72 | HR 73 | Ht 61.0 in | Wt 144.4 lb

## 2011-11-03 DIAGNOSIS — R002 Palpitations: Secondary | ICD-10-CM

## 2011-11-03 DIAGNOSIS — I10 Essential (primary) hypertension: Secondary | ICD-10-CM

## 2011-11-03 LAB — BASIC METABOLIC PANEL
GFR: 63.78 mL/min (ref >60.00)
Glucose, Bld: 128 mg/dL — ABNORMAL HIGH (ref 70–99)
Potassium: 4.3 mEq/L (ref 3.5–5.1)
Sodium: 137 mEq/L (ref 135–145)

## 2011-11-03 NOTE — Assessment & Plan Note (Signed)
Lisa Sawyer seems to be doing fairly well. She's no longer having significant palpitations. She complains of generalized fatigue. She feels a stretching sensation when she reaches up over her head.  I don't think that any of these are cardiac related. I've asked her several times and she assures me that she does not have any episodes of chest pain. She'll followup with her general medical Dr. [name] see [place] on an as-needed basis.  Her blood pressure is a little elevated today but she admits that she still eating extra salt.

## 2011-11-03 NOTE — Patient Instructions (Signed)
Your physician recommends that you return for lab work in: TODAY//BMET  Your physician recommends that you schedule a follow-up appointment in: AS NEEDED BASIS

## 2011-11-03 NOTE — Progress Notes (Signed)
Lisa Sawyer Date of Birth  1932-05-20 [facility]     [place] [facility]  [address]    [address]   [address] [place], [place]  [address]    [place], [place]  [address] [phone]  Fax  [phone]  [phone]  Fax [phone]   Problems: 1. Hypertension 2. Palpitations 3. Hypothyroidism  History of Present Illness:  She complains of lots of fatigue.  Palpitations are better.  No chest pain.  Current Outpatient Prescriptions on File Prior to Visit  Medication Sig Dispense Refill  . acetaminophen (TYLENOL) 500 MG tablet Take 500 mg by mouth every 6 (six) hours as needed. For pain.      Marland Kitchen amLODipine (NORVASC) 10 MG tablet Take 1 tablet (10 mg total) by mouth daily.  30 tablet  11  . aspirin EC 81 MG tablet Take 81 mg by mouth daily.        . calcium-vitamin D (OSCAL) 250-125 MG-UNIT per tablet Take 1 tablet by mouth daily.        . cholecalciferol (VITAMIN D) 1000 UNITS tablet Take 1,000 Units by mouth daily.      . fish oil-omega-3 fatty acids 1000 MG capsule Take 1 g by mouth daily.       Marland Kitchen levothyroxine (SYNTHROID, LEVOTHROID) 100 MCG tablet Take 100 mcg by mouth daily.        Marland Kitchen lisinopril (PRINIVIL,ZESTRIL) 20 MG tablet Take 1 tablet (20 mg total) by mouth daily.  30 tablet  11  . loratadine (CLARITIN) 10 MG tablet Take 10 mg by mouth daily.        . Multiple Vitamin (MULITIVITAMIN WITH MINERALS) TABS Take 1 tablet by mouth daily.      . propranolol (INDERAL) 10 MG tablet Take 1 tablet (10 mg total) by mouth daily as needed.  30 tablet  6    Allergies  Allergen Reactions  . Naproxen     Past Medical History  Diagnosis Date  . Hypertension   . Thyroid disease   . Anemia   . Thrombocytopenia   . Osteoarthritis     left knee  . Hypothyroidism   . Hypercholesterolemia   . History of breast cancer   . Idiopathic thrombocytopenic purpura     A questionable history of idiopathic thrombocytopenic purpura  . Acute blood loss anemia    secondary to surgery, requiring blood transfusion  . Hypokalemia     treated  . Leukocytosis   . Pyrexia     Past Surgical History  Procedure Date  . Cholecystectomy   . Colon surgery   . Knee reconstruction, medial patellar femoral ligament     History  Smoking status  . Never Smoker   Smokeless tobacco  . Not on file    History  Alcohol Use No    No family history on file.  Reviw of Systems:  Reviewed in the HPI.  All other systems are negative.  Physical Exam: Blood pressure 152/72, pulse 73, height 5\' 1"  (1.549 m), weight 144 lb 6.4 oz (65.499 kg). General: Well developed, well nourished, in no acute distress.  Head: Normocephalic, atraumatic, sclera non-icteric, mucus membranes are moist,   Neck: Supple. Carotids are 2 + without bruits. No JVD  Lungs: Clear bilaterally to auscultation.  Heart: regular rate.  normal  S1 S2. No murmurs, gallops or rubs.  Abdomen: Soft, non-tender, non-distended with normal bowel sounds. No hepatomegaly. No rebound/guarding. No masses.  Msk:  Strength and tone  are normal  Extremities: No clubbing or cyanosis. No edema.  Distal pedal pulses are 2+ and equal bilaterally.  Neuro: Alert and oriented X 3. Moves all extremities spontaneously.  Psych:  Responds to questions appropriately with a normal affect.  ECG:  Assessment / Plan:

## 2011-11-14 ENCOUNTER — Ambulatory Visit: Payer: PRIVATE HEALTH INSURANCE | Admitting: Cardiovascular Disease

## 2011-11-16 ENCOUNTER — Ambulatory Visit: Payer: Self-pay | Admitting: Family Medicine

## 2011-11-24 ENCOUNTER — Ambulatory Visit: Payer: PRIVATE HEALTH INSURANCE | Admitting: Family Medicine

## 2011-11-25 ENCOUNTER — Ambulatory Visit: Payer: PRIVATE HEALTH INSURANCE | Admitting: Family Medicine

## 2011-12-08 ENCOUNTER — Ambulatory Visit (INDEPENDENT_AMBULATORY_CARE_PROVIDER_SITE_OTHER): Payer: PRIVATE HEALTH INSURANCE | Admitting: Family Medicine

## 2011-12-08 ENCOUNTER — Encounter: Payer: Self-pay | Admitting: Family Medicine

## 2011-12-08 VITALS — BP 180/70 | HR 78 | Temp 99.0°F | Resp 16 | Ht 61.0 in | Wt 141.4 lb

## 2011-12-08 DIAGNOSIS — F341 Dysthymic disorder: Secondary | ICD-10-CM

## 2011-12-08 DIAGNOSIS — F419 Anxiety disorder, unspecified: Secondary | ICD-10-CM

## 2011-12-08 DIAGNOSIS — T887XXA Unspecified adverse effect of drug or medicament, initial encounter: Secondary | ICD-10-CM

## 2011-12-08 DIAGNOSIS — E039 Hypothyroidism, unspecified: Secondary | ICD-10-CM

## 2011-12-08 DIAGNOSIS — I10 Essential (primary) hypertension: Secondary | ICD-10-CM

## 2011-12-08 DIAGNOSIS — F418 Other specified anxiety disorders: Secondary | ICD-10-CM | POA: Insufficient documentation

## 2011-12-08 MED ORDER — MOMETASONE FUROATE 50 MCG/ACT NA SUSP: 2.0000 | Freq: Every day | NASAL | Status: DC

## 2011-12-08 NOTE — Progress Notes (Signed)
Subjective:    Patient ID: Lisa Sawyer, female    DOB: 24-Feb-1932, 78 y.o.   MRN: [id number]  HPI Thsi 78 y.o. AA female is here to establish care and is known to me from previous care at [facility]. She was recently evaluated by Dr. Melburn Popper for HTN and palpitations; BP values   at home are in the normal range per pt (she forgot to bring the log today). There has been some changes  with BP meds; she was prescribed Coreg at one point but did not tolerate it well (it caused low heart rate  with dizziness). She denies any symptoms of chest pain, dizziness or palpitations.  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 She is experiencing a lot anxiety with this situation and has little help from   the cousin's primary family members. In the future, the pt. is planning to move to [place] where she has   one son and his family residing.  Review of Systems  Constitutional: Positive for fatigue and unexpected weight change. Negative for activity change and appetite change.  Respiratory: Negative for cough, chest tightness and shortness of breath.   Cardiovascular: Positive for leg swelling. Negative for chest pain.       LE edema coincides with use of increased dose of Amlodipine  Musculoskeletal: Negative.   Neurological: Negative for dizziness, syncope, weakness, light-headedness, numbness and headaches.  Psychiatric/Behavioral: Positive for sleep disturbance. Negative for confusion and agitation. The patient is nervous/anxious.        Objective:   Physical Exam  Nursing note and vitals reviewed. Constitutional: She is oriented to person, place, and time. She appears well-developed and well-nourished. No distress.  HENT:  Head: Normocephalic and atraumatic.  Right Ear: External ear normal.  Left Ear: External ear normal.  Nose: Nose normal.  Mouth/Throat:  Oropharynx is clear and moist.  Eyes: Conjunctivae and EOM are normal. No scleral icterus.  Neck: Normal range of motion. Neck supple. No JVD present. No thyromegaly present.  Cardiovascular: Normal rate, regular rhythm and normal heart sounds.  Exam reveals no gallop.   No murmur heard. Pulmonary/Chest: Effort normal and breath sounds normal. No respiratory distress. She has no rales.  Abdominal: Soft. Bowel sounds are normal. She exhibits no distension and no mass. There is no tenderness.  Musculoskeletal: She exhibits edema. She exhibits no tenderness.       Bilateral trace- 1+ ankle edema  Neurological: She is alert and oriented to person, place, and time. No cranial nerve deficit.  Skin: Skin is warm and dry.  Psychiatric: She has a normal mood and affect. Her behavior is normal. Thought content normal.    Note: BP checked with small cuff; pt's upper arm is more likely to need a medium-size cuff      Assessment & Plan:   1. HTN (hypertension)  Vitamin D, 25-hydroxy, Basic metabolic panel Stable on current medications (no changes at this time)  2. Hypothyroidism  TSH; No change of Levothyroxine dose at this time  3. Medication side effects  Reassurance; swelling is minimal and due to increased Amlodipine dose.  4. Anxiety  Pt to access local resources for Seniors to get assistance with care of cousin; start process for Assisted Living for her cousin so that she can have peace-of-mind about the well-being of her family member when she finally  moves to [place]

## 2011-12-08 NOTE — Patient Instructions (Signed)
Wellsite geologist of [place] to get additional information about getting help for Caretakers. Contact [employer] about workshop in the community for Caretakers.

## 2011-12-09 LAB — BASIC METABOLIC PANEL
BUN: 22 mg/dL (ref 6–23)
CO2: 28 mEq/L (ref 19–32)
Calcium: 10.5 mg/dL (ref 8.4–10.5)
Chloride: 101 mEq/L (ref 96–112)
Creat: 1.23 mg/dL — ABNORMAL HIGH (ref 0.50–1.10)
Glucose, Bld: 98 mg/dL (ref 70–99)
Potassium: 4.1 mEq/L (ref 3.5–5.3)
Sodium: 140 mEq/L (ref 135–145)

## 2011-12-09 LAB — VITAMIN D 25 HYDROXY (VIT D DEFICIENCY, FRACTURES): Vit D, 25-Hydroxy: 50 ng/mL (ref 30–89)

## 2011-12-09 LAB — TSH: TSH: 1.132 u[IU]/mL (ref 0.350–4.500)

## 2011-12-13 NOTE — Progress Notes (Signed)
Quick Note:  Please call pt and advise that the following labs are abnormal...  All your labs are normal except Creatinine which is the kidney function test; it is a little above normal because you have High Blood Pressure and your medications impact this also. Continue your current medications and maintain good hydration. Try to limit the amount of protein in your diet ( try having meatless meals once a week). Your kidney function will be watched monitored every 3-6 months or more often if needed.  Provide copy of labs to pt. ______

## 2011-12-15 ENCOUNTER — Encounter: Payer: Self-pay | Admitting: *Deleted

## 2012-01-11 ENCOUNTER — Emergency Department (HOSPITAL_COMMUNITY)
Admission: EM | Admit: 2012-01-11 | Discharge: 2012-01-12 | Disposition: A | Discharge status: Home / Self Care | Payer: PRIVATE HEALTH INSURANCE | Attending: Emergency Medicine | Admitting: Emergency Medicine

## 2012-01-11 ENCOUNTER — Encounter (HOSPITAL_COMMUNITY): Payer: Self-pay | Admitting: *Deleted

## 2012-01-11 DIAGNOSIS — E7800 Pure hypercholesterolemia, unspecified: Secondary | ICD-10-CM | POA: Insufficient documentation

## 2012-01-11 DIAGNOSIS — E039 Hypothyroidism, unspecified: Secondary | ICD-10-CM | POA: Insufficient documentation

## 2012-01-11 DIAGNOSIS — Z853 Personal history of malignant neoplasm of breast: Secondary | ICD-10-CM | POA: Insufficient documentation

## 2012-01-11 DIAGNOSIS — I10 Essential (primary) hypertension: Secondary | ICD-10-CM | POA: Insufficient documentation

## 2012-01-11 DIAGNOSIS — R21 Rash and other nonspecific skin eruption: Secondary | ICD-10-CM

## 2012-01-11 NOTE — ED Provider Notes (Signed)
History     CSN: [id number]  Arrival date & time 01/11/12  2014   First MD Initiated Contact with Patient 01/11/12 2331      Chief Complaint  Patient presents with  . Rash    (Consider location/radiation/quality/duration/timing/severity/associated sxs/prior treatment) Patient is a 78 y.o. female presenting with rash. The history is provided by the patient. No language interpreter was used.  Rash  This is a new problem. The current episode started more than 1 week ago. The problem is associated with an unknown factor. There has been no fever. The rash is present on the torso, back, face, left lower leg and right upper leg. The pain is at a severity of 0/10. The patient is experiencing no pain.   Rash to torso and extremities intermittant x 1 week. No wheezing throat edema or distress. Has taken nothing for the rash.  No new meds/ soap or other substances.  Past Medical History  Diagnosis Date  . Hypertension   . Thyroid disease   . Anemia   . Thrombocytopenia   . Osteoarthritis     left knee  . Hypothyroidism   . Hypercholesterolemia   . History of breast cancer   . Idiopathic thrombocytopenic purpura     A questionable history of idiopathic thrombocytopenic purpura  . Acute blood loss anemia     secondary to surgery, requiring blood transfusion  . Hypokalemia     treated  . Leukocytosis   . Pyrexia     Past Surgical History  Procedure Date  . Cholecystectomy   . Colon surgery   . Knee reconstruction, medial patellar femoral ligament     No family history on file.  History  Substance Use Topics  . Smoking status: Former Games developer  . Smokeless tobacco: Not on file  . Alcohol Use: No    OB History    Grav Para Term Preterm Abortions TAB SAB Ect Mult Living                  Review of Systems  Constitutional: Negative.   HENT: Negative.   Eyes: Negative.   Respiratory: Negative.   Cardiovascular: Negative.   Gastrointestinal: Negative.   Skin: Positive  for rash.  Neurological: Negative.   Psychiatric/Behavioral: Negative.   All other systems reviewed and are negative.    Allergies  Naproxen  Home Medications   Current Outpatient Rx  Name Route Sig Dispense Refill  . ACETAMINOPHEN 500 MG PO TABS Oral Take 500 mg by mouth every 6 (six) hours as needed. For pain.    Marland Kitchen AMLODIPINE BESYLATE 10 MG PO TABS Oral Take 1 tablet (10 mg total) by mouth daily. 30 tablet 11  . ASPIRIN EC 81 MG PO TBEC Oral Take 81 mg by mouth daily.      Marland Kitchen CALCIUM-VITAMIN D 250-125 MG-UNIT PO TABS Oral Take 1 tablet by mouth daily.      Marland Kitchen VITAMIN D 1000 UNITS PO TABS Oral Take 1,000 Units by mouth daily.    . OMEGA-3 FATTY ACIDS 1000 MG PO CAPS Oral Take 1 g by mouth daily.     Marland Kitchen LEVOTHYROXINE SODIUM 100 MCG PO TABS Oral Take 100 mcg by mouth daily.      Marland Kitchen LISINOPRIL 20 MG PO TABS Oral Take 1 tablet (20 mg total) by mouth daily. 30 tablet 11  . LORATADINE 10 MG PO TABS Oral Take 10 mg by mouth daily.      . MOMETASONE FUROATE 50 MCG/ACT NA SUSP  Nasal Place 2 sprays into the nose daily. 17 g 12  . ADULT MULTIVITAMIN W/MINERALS CH Oral Take 1 tablet by mouth daily.      BP 151/53  Pulse 75  Temp(Src) 99 F (37.2 C) (Oral)  Resp 12  SpO2 100%  Physical Exam  Nursing note and vitals reviewed. Constitutional: She is oriented to person, place, and time. She appears well-developed and well-nourished.  HENT:  Head: Normocephalic.  Eyes: Conjunctivae and EOM are normal. Pupils are equal, round, and reactive to light.  Neck: Normal range of motion. Neck supple.  Cardiovascular: Normal rate.   Pulmonary/Chest: Effort normal.  Abdominal: Soft.  Musculoskeletal: Normal range of motion.  Neurological: She is alert and oriented to person, place, and time.  Skin: Skin is dry. Rash noted.       Hives to upper/ lower extremities and torso.   Psychiatric: She has a normal mood and affect.    ED Course  Procedures (including critical care time)  Labs Reviewed -  No data to display No results found.   No diagnosis found.    MDM  Hives intermittant x 1 week.  Will treat with benadryl and pepcid x 3-5 days.  No respiratory problems.  Refused prednisone rx.  Will follow up with Dr. Audria Nine.        Remi Haggard, NP 01/12/12 [phone]

## 2012-01-11 NOTE — ED Notes (Signed)
Patient chart was d/c in error.

## 2012-01-11 NOTE — Discharge Instructions (Signed)
Take the benadryl every 6 hours x 24 -48.  Do not drive with this.  It may make you sleepy.  Hold your claratin while you are taking the benadryl.  Take pepcid over the counter while you are taking the benadryl.  Follow up with Dr. Audria Nine or return to the ER if worse with difficulty breathing or wheezing.  Allergic Reaction, Mild to Moderate Allergies may happen from anything your body is sensitive to. This may be food, medications, pollens, chemicals, and nearly anything around you in everyday life that produces allergens. An allergen is anything that causes an allergy producing substance. Allergens cause your body to release allergic antibodies. Through a chain of events, they cause a release of histamine into the blood stream. Histamines are meant to protect you, but they also cause your discomfort. This is why antihistamines are often used for allergies. Heredity is often a factor in causing allergic reactions. This means you may have some of the same allergies as your parents. Allergies happen in all age groups. You may have some idea of what caused your reaction. There are many allergens around [place]. It may be difficult to know what caused your reaction. If this is a first time event, it may never happen again. Allergies cannot be cured but can be controlled with medications. SYMPTOMS  You may get some or all of the following problems from allergies.  Swelling and itching in and around the mouth.   Tearing, itchy eyes.   Nasal congestion and runny nose.   Sneezing and coughing.   An itchy red rash or hives.   Vomiting or diarrhea.   Difficulty breathing.  Seasonal allergies occur in all age groups. They are seasonal because they usually occur during the same season every year. They may be a reaction to molds, grass pollens, or tree pollens. Other causes of allergies are house dust mite allergens, pet dander and mold spores. These are just a common few of the thousands of allergens around  [place]. All of the symptoms listed above happen when you come in contact with pollens and other allergens. Seasonal allergies are usually not life threatening. They are generally more of a nuisance that can often be handled using medications. Hay fever is a combination of all or some of the above listed allergy problems. It may often be treated with simple over-the-counter medications such as diphenhydramine. Take medication as directed. Check with your caregiver or package insert for child dosages. TREATMENT AND HOME CARE INSTRUCTIONS If hives or rash are present:  Take medications as directed.   You may use an over-the-counter antihistamine (diphenhydramine) for hives and itching as needed. Do not drive or drink alcohol until medications used to treat the reaction have worn off. Antihistamines tend to make people sleepy.   Apply cold cloths (compresses) to the skin or take baths in cool water. This will help itching. Avoid hot baths or showers. Heat will make a rash and itching worse.   If your allergies persist and become more severe, and over the counter medications are not effective, there are many new medications your caretaker can prescribe. Immunotherapy or desensitizing injections can be used if all else fails. Follow up with your caregiver if problems continue.  SEEK MEDICAL CARE IF:   Your allergies are becoming progressively more troublesome.   You suspect a food allergy. Symptoms generally happen within 30 minutes of eating a food.   Your symptoms have not gone away within 2 days or are getting worse.  You develop new symptoms.   You want to retest yourself or your child with a food or drink you think causes an allergic reaction. Never test yourself or your child of a suspected allergy without being under the watchful eye of your caregivers. A second exposure to an allergen may be life-threatening.  SEEK IMMEDIATE MEDICAL CARE IF:  You develop difficulty breathing or wheezing, or  have a tight feeling in your chest or throat.   You develop a swollen mouth, hives, swelling, or itching all over your body.  A severe reaction with any of the above problems should be considered life-threatening. If you suddenly develop difficulty breathing call for local emergency medical help. THIS IS AN EMERGENCY. MAKE SURE YOU:   Understand these instructions.   Will watch your condition.   Will get help right away if you are not doing well or get worse.  Document Released: 06/12/2007 Document Revised: 08/04/2011 Document Reviewed: 06/12/2007 [facility] Patient Information 2012 [place], [place].

## 2012-01-11 NOTE — ED Notes (Addendum)
Pt has rash x 1 week.  Pt thinks it may have started on L arm.  Presently rash of different stages to extremities and head.  Pt states she has felt warm at home, but denies chills or coughs.  Pt did not take bendryl at home.

## 2012-01-12 MED ORDER — FAMOTIDINE 20 MG PO TABS: 20.0000 mg | ORAL_TABLET | Freq: Two times a day (BID) | ORAL | Status: DC

## 2012-01-12 MED ORDER — BENADRYL 25 MG PO TABS: 25.0000 mg | ORAL_TABLET | Freq: Four times a day (QID) | ORAL | Status: DC | PRN

## 2012-01-13 ENCOUNTER — Encounter: Payer: Self-pay | Admitting: Family Medicine

## 2012-01-13 ENCOUNTER — Ambulatory Visit (INDEPENDENT_AMBULATORY_CARE_PROVIDER_SITE_OTHER): Payer: PRIVATE HEALTH INSURANCE | Admitting: Family Medicine

## 2012-01-13 VITALS — BP 135/60 | HR 69 | Temp 98.0°F | Resp 16 | Ht 62.0 in | Wt 143.4 lb

## 2012-01-13 DIAGNOSIS — T887XXA Unspecified adverse effect of drug or medicament, initial encounter: Secondary | ICD-10-CM

## 2012-01-13 DIAGNOSIS — L509 Urticaria, unspecified: Secondary | ICD-10-CM

## 2012-01-13 DIAGNOSIS — I10 Essential (primary) hypertension: Secondary | ICD-10-CM

## 2012-01-13 DIAGNOSIS — R002 Palpitations: Secondary | ICD-10-CM

## 2012-01-13 MED ORDER — AMLODIPINE BESYLATE 5 MG PO TABS: 5.0000 mg | ORAL_TABLET | Freq: Every day | ORAL | Status: DC

## 2012-01-13 NOTE — ED Provider Notes (Signed)
Medical screening examination/treatment/procedure(s) were performed by non-physician practitioner and as supervising physician I was immediately available for consultation/collaboration.   Forbes Cellar, MD 01/13/12 [phone]

## 2012-01-13 NOTE — Patient Instructions (Addendum)
Amlodipine has been reduced to 5 mg- 1 tablet daily. Keep your next appointment as scheduled. Try to really limit salt intake.    Shingles Vaccine What You Need to Know WHAT IS SHINGLES?  Shingles is a painful skin rash, often with blisters. It is also called Herpes Zoster or just Zoster.   A shingles rash usually appears on one side of the face or body and lasts from 2 to 4 weeks. Its main symptom is pain, which can be quite severe. Other symptoms of shingles can include fever, headache, chills, and upset stomach. Very rarely, a shingles infection can lead to pneumonia, hearing problems, blindness, brain inflammation (encephalitis), or death.   For about 1 person in 5, severe pain can continue even after the rash clears up. This is called post-herpetic neuralgia.   Shingles is caused by the Varicella Zoster virus. This is the same virus that causes chickenpox. Only someone who has had a case of chickenpox or rarely, has gotten chickenpox vaccine, can get shingles. The virus stays in your body. It can reappear many years later to cause a case of shingles.   You cannot catch shingles from another person with shingles. However, a person who has never had chickenpox (or chickenpox vaccine) could get chickenpox from someone with shingles. This is not very common.   Shingles is far more common in people 58 and older than in younger people. It is also more common in people whose immune systems are weakened because of a disease such as cancer or drugs such as steroids or chemotherapy.   At least 1 million people get shingles per year in the [place].  SHINGLES VACCINE  A vaccine for shingles was licensed in 2006. In clinical trials, the vaccine reduced the risk of shingles by 50%. It can also reduce the pain in people who still get shingles after being vaccinated.   A single dose of shingles vaccine is recommended for adults 39 years of age and older.  SOME PEOPLE SHOULD NOT GET SHINGLES  VACCINE OR SHOULD WAIT A person should not get shingles vaccine if he or she:  Has ever had a life-threatening allergic reaction to gelatin, the antibiotic neomycin, or any other component of shingles vaccine. Tell your caregiver if you have any severe allergies.   Has a weakened immune system because of current:   AIDS or another disease that affects the immune system.   Treatment with drugs that affect the immune system, such as prolonged use of high-dose steroids.   Cancer treatment, such as radiation or chemotherapy.   Cancer affecting the bone marrow or lymphatic system, such as leukemia or lymphoma.   Is pregnant, or might be pregnant. Women should not become pregnant until at least 4 weeks after getting shingles vaccine.  Someone with a minor illness, such as a cold, may be vaccinated. Anyone with a moderate or severe acute illness should usually wait until he or she recovers before getting the vaccine. This includes anyone with a temperature of 101.3 F (38 C) or higher. WHAT ARE THE RISKS FROM SHINGLES VACCINE?  A vaccine, like any medicine, could possibly cause serious problems, such as severe allergic reactions. However, the risk of a vaccine causing serious harm, or death, is extremely small.   No serious problems have been identified with shingles vaccine.  Mild Problems  Redness, soreness, swelling, or itching at the site of the injection (about 1 person in 3).   Headache (about 1 person in 70).  Like  all vaccines, shingles vaccine is being closely monitored for unusual or severe problems. WHAT IF THERE IS A MODERATE OR SEVERE REACTION? What should I look for? Any unusual condition, such as a severe allergic reaction or a high fever. If a severe allergic reaction occurred, it would be within a few minutes to an hour after the shot. Signs of a serious allergic reaction can include difficulty breathing, weakness, hoarseness or wheezing, a fast heartbeat, hives, dizziness,  paleness, or swelling of the throat. What should I do?  Call your caregiver, or get the person to a caregiver right away.   Tell the caregiver what happened, the date and time it happened, and when the vaccination was given.   Ask the caregiver to report the reaction by filing a Vaccine Adverse Event Reporting System (VAERS) form. Or, you can file this report through the VAERS web site at www.vaers.LAgents.no or by calling 1-(435)413-9967.  VAERS does not provide medical advice. HOW CAN I LEARN MORE?  Ask your caregiver. He or she can give you the vaccine package insert or suggest other sources of information.   Contact the Centers for Disease Control and Prevention (CDC):   Call 3040596407 (1-800-CDC-INFO).   Visit the CDC website at PicCapture.uy  CDC Shingles Vaccine VIS (06/03/08) Document Released: 06/12/2006 Document Revised: 08/04/2011 Document Reviewed: 06/03/2008 [facility] Patient Information 2012 [place], [place].

## 2012-01-16 ENCOUNTER — Encounter: Payer: Self-pay | Admitting: Family Medicine

## 2012-01-16 NOTE — Progress Notes (Signed)
  Subjective:    Patient ID: Lisa Sawyer, female    DOB: 13-Jan-1932, 78 y.o.   MRN: [id number]  HPI   This 78 y.o. AA female is here for f/u of rash which was evaluated at [facility] ED on 01/11/12. She was  advised to see her PCP within 2 days. The rash is better but she was extremely concerned as to what  might have caused it. Diagnosis was "Allergic reaction" but no specific culprit identified. After some  questioning, it was determined that she probably had hives from eating strawberries purchased at [employer]  1-2 days earlier.  She is taking Loratidine for Allergic rhinitis but did take Benadryl briefly to improve rash.         She also still has ankle edema due to Amlodipine 10 mg and she would like to reduce the dose of this  medication. She states she is keeping salt intake at a minimum. She denies CP, HA, palpitations, dizziness,  weakness or syncope. She continues to have anxiety about her overall health.     Review of Systems As per HPI     Objective:   Physical Exam  Nursing note and vitals reviewed. Constitutional: She is oriented to person, place, and time. She appears well-developed and well-nourished. No distress.  HENT:  Head: Normocephalic and atraumatic.  Right Ear: External ear normal.  Left Ear: External ear normal.  Mouth/Throat: Oropharynx is clear and moist.  Eyes: Conjunctivae and EOM are normal. No scleral icterus.  Neck: No thyromegaly present.  Cardiovascular: Normal rate, regular rhythm and normal heart sounds.   Pulmonary/Chest: Effort normal and breath sounds normal. No respiratory distress.  Musculoskeletal: She exhibits edema.       Bilateral ankle edema  Lymphadenopathy:    She has no cervical adenopathy.  Neurological: She is alert and oriented to person, place, and time. No cranial nerve deficit.  Skin: Skin is warm and dry. Rash noted.       Faint dime-size erythematous raised lesions on upper ext  Psychiatric: She has a normal mood and affect. Her  behavior is normal. Thought content normal.          Assessment & Plan:   1. Essential hypertension, benign     Stable  2. Hives            Resolving; Continue taking Loratidine daily, Use OTC Caladryl topical for itching  3. Palpitations   This is a chronic issue partially relate to anxiety  4. Medication side effect     Reduce Amlodipine to 5 mg daily; recheck BP and considr adding another agent to control BP but minimize edema Follow-up as scheduled in June

## 2012-02-16 ENCOUNTER — Ambulatory Visit (INDEPENDENT_AMBULATORY_CARE_PROVIDER_SITE_OTHER): Payer: PRIVATE HEALTH INSURANCE | Admitting: Family Medicine

## 2012-02-16 ENCOUNTER — Encounter: Payer: Self-pay | Admitting: Family Medicine

## 2012-02-16 VITALS — BP 164/68 | HR 57 | Temp 98.2°F | Resp 20 | Ht 62.0 in | Wt 143.2 lb

## 2012-02-16 DIAGNOSIS — F411 Generalized anxiety disorder: Secondary | ICD-10-CM

## 2012-02-16 DIAGNOSIS — D649 Anemia, unspecified: Secondary | ICD-10-CM

## 2012-02-16 DIAGNOSIS — F419 Anxiety disorder, unspecified: Secondary | ICD-10-CM

## 2012-02-16 DIAGNOSIS — R600 Localized edema: Secondary | ICD-10-CM

## 2012-02-16 DIAGNOSIS — R609 Edema, unspecified: Secondary | ICD-10-CM

## 2012-02-16 DIAGNOSIS — T887XXA Unspecified adverse effect of drug or medicament, initial encounter: Secondary | ICD-10-CM

## 2012-02-16 NOTE — Progress Notes (Signed)
  Subjective:    Patient ID: Lisa Sawyer, female    DOB: 07-12-1932, 78 y.o.   MRN: [id number]  HPI  This 78 y.o. AA female is here because of worsening ankle edema and fatigue. She attributes  edema to Amlodipine which she takes for palpitations. She saw Dr. Melburn Popper in March; HTN and other isues  are stable and she is to f/u prn. Her ankles are less swollen upon awakening every AM but swelling  progresses with ADLs. She has minimal SOB/ DOE but finds she has less energy to do daily activities.  Feels more fatigued despite adequate sleep. Some days "doesn't feel like doing anything". Does not think  she is depressed but very concerned about getting older and tends to worry about every aspect of her health.   Re: HTN- BP readings at home range from 130-179/ 63-76; heart rate in 60-70 range   Review of Systems  Constitutional: Positive for activity change. Negative for fever and appetite change.  Respiratory: Positive for shortness of breath. Negative for cough, chest tightness and wheezing.        No orthopnea  Cardiovascular: Positive for leg swelling. Negative for chest pain and palpitations.  Gastrointestinal: Negative for constipation and abdominal distention.  Musculoskeletal: Positive for myalgias and back pain.  Neurological: Negative for dizziness, syncope, weakness, light-headedness and headaches.  Psychiatric/Behavioral: Positive for disturbed wake/sleep cycle. The patient is nervous/anxious.        Objective:   Physical Exam  Vitals reviewed. Constitutional: She is oriented to person, place, and time. She appears well-developed and well-nourished. No distress.  HENT:  Head: Normocephalic and atraumatic.  Eyes: EOM are normal. No scleral icterus.  Neck: No thyromegaly present.  Cardiovascular: Normal rate, regular rhythm, normal heart sounds and intact distal pulses.  Exam reveals no gallop and no friction rub.   No murmur heard. Pulmonary/Chest: Effort normal. No  respiratory distress.  Abdominal: She exhibits no distension. There is no tenderness. There is no guarding.  Musculoskeletal: She exhibits edema. She exhibits no tenderness.       Trace pitting edema of lower ext (nontender); no erythema  Neurological: She is alert and oriented to person, place, and time. No cranial nerve deficit.  Skin: Skin is warm and dry.  Psychiatric: Her behavior is normal. Thought content normal.       Flat affect but somewhat anxious- appearing facies    ZUNG ANXIETY RATING: Mild anxiety     Assessment & Plan:   1. Edema of both legs  CBC with Differential  2. Anemia  CBC with Differential  3. Medication side effect  Given hx of Palpitations, I would consider ahving pt return to see Dr. Melburn Popper if medication change is neccesary   Re: Anxiety- pt is aware of this issue and prefers to cope with it non-medically.

## 2012-02-16 NOTE — Patient Instructions (Signed)
Peripheral Edema You have swelling in your legs (peripheral edema). This swelling is due to excess accumulation of salt and water in your body. Edema may be a sign of heart, kidney or liver disease, or a side effect of a medication. It may also be due to problems in the leg veins. Elevating your legs and using special support stockings may be very helpful, if the cause of the swelling is due to poor venous circulation. Avoid long periods of standing, whatever the cause. Treatment of edema depends on identifying the cause. Chips, pretzels, pickles and other salty foods should be avoided. Restricting salt in your diet is almost always needed. Water pills (diuretics) are often used to remove the excess salt and water from your body via urine. These medicines prevent the kidney from reabsorbing sodium. This increases urine flow. Diuretic treatment may also result in lowering of potassium levels in your body. Potassium supplements may be needed if you have to use diuretics daily. Daily weights can help you keep track of your progress in clearing your edema. You should call your caregiver for follow up care as recommended. SEEK IMMEDIATE MEDICAL CARE IF:   You have increased swelling, pain, redness, or heat in your legs.   You develop shortness of breath, especially when lying down.   You develop chest or abdominal pain, weakness, or fainting.   You have a fever.  Document Released: 09/22/2004 Document Revised: 08/04/2011 Document Reviewed: 09/02/2009 [facility] Patient Information 2012 [place], [place].   You will be referred back to Dr. Melburn Popper for adjustment of medication and further evaluation of lower extremity edema. Elevate your legs for at least 30 minutes twice a day to help relieve swelling in your legs.

## 2012-02-17 LAB — CBC WITH DIFFERENTIAL/PLATELET
Eosinophils Relative: 7 % — ABNORMAL HIGH (ref 0–5)
HCT: 37.8 % (ref 36.0–46.0)
Hemoglobin: 12.3 g/dL (ref 12.0–15.0)
Lymphocytes Relative: 43 % (ref 12–46)
Lymphs Abs: 2.4 10*3/uL (ref 0.7–4.0)
MCV: 93.6 fL (ref 78.0–100.0)
Monocytes Absolute: 0.4 10*3/uL (ref 0.1–1.0)
Monocytes Relative: 7 % (ref 3–12)
Neutro Abs: 2.4 10*3/uL (ref 1.7–7.7)
RBC: 4.04 MIL/uL (ref 3.87–5.11)
RDW: 13.5 % (ref 11.5–15.5)
WBC: 5.5 10*3/uL (ref 4.0–10.5)

## 2012-02-17 NOTE — Progress Notes (Signed)
Quick Note:  Please notify pt that results are normal.   Provide pt with copy of labs. ______ 

## 2012-02-21 ENCOUNTER — Ambulatory Visit (INDEPENDENT_AMBULATORY_CARE_PROVIDER_SITE_OTHER): Payer: PRIVATE HEALTH INSURANCE | Admitting: Cardiovascular Disease

## 2012-02-21 ENCOUNTER — Encounter: Payer: Self-pay | Admitting: Cardiovascular Disease

## 2012-02-21 VITALS — BP 184/75 | HR 68 | Wt 142.0 lb

## 2012-02-21 DIAGNOSIS — I10 Essential (primary) hypertension: Secondary | ICD-10-CM

## 2012-02-21 DIAGNOSIS — E039 Hypothyroidism, unspecified: Secondary | ICD-10-CM

## 2012-02-21 DIAGNOSIS — R002 Palpitations: Secondary | ICD-10-CM

## 2012-02-21 DIAGNOSIS — R609 Edema, unspecified: Secondary | ICD-10-CM | POA: Insufficient documentation

## 2012-02-21 MED ORDER — LISINOPRIL 40 MG PO TABS: 40.0000 mg | ORAL_TABLET | Freq: Every day | ORAL | Status: DC

## 2012-02-21 MED ORDER — FUROSEMIDE 20 MG PO TABS: 20.0000 mg | ORAL_TABLET | Freq: Every day | ORAL | Status: DC

## 2012-02-21 NOTE — Assessment & Plan Note (Signed)
Continue replacement Rx TSH with primary

## 2012-02-21 NOTE — Assessment & Plan Note (Signed)
D/C amlodipine.  Add lasix.  Venous duplex in 1-2 weeks after edema improved.  If not improved with this consider 24 hour urine protein.

## 2012-02-21 NOTE — Assessment & Plan Note (Signed)
Increase lisinopril and add Lasix  D/C amlodipine

## 2012-02-21 NOTE — Patient Instructions (Addendum)
STOP AMLODIPINE  Increase Lisinopril dose to 40mg  daily.  Start Lasix 20mg  every morning.  Your physician recommends that you return for lab work in: 4 weeks.  BMET  Your physician recommends that you schedule a follow-up appointment in: 4-6 weeks with Dr. Elease Hashimoto.  Your physician has requested that you have a lower extremity venous duplex. This test is an ultrasound of the veins in the legs or arms. It looks at venous blood flow that carries blood from the heart to the legs or arms. Allow one hour for a Lower Venous exam. Allow thirty minutes for an Upper Venous exam. There are no restrictions or special instructions.

## 2012-02-21 NOTE — Progress Notes (Signed)
Patient ID: Lisa Sawyer, female   DOB: 01-28-1932, 78 y.o.   MRN: [id number] 78 yo of Dr Elease Hashimoto.  Last seen in March.  Has had palpitations, HTN and edema.  Echo 10/06/11 benign  - Left ventricle: The cavity size was normal. Wall thickness was increased in a pattern of mild LVH. Systolic function was normal. The estimated ejection fraction was in the range of 55% to 65%. Wall motion was normal; there were no regional wall motion abnormalities. Doppler parameters are consistent with abnormal left ventricular relaxation (grade 1 diastolic dysfunction). - Mitral valve: Mild regurgitation. - Atrial septum: No defect or patent foramen ovale was identified. - Tricuspid valve: Mild-moderate regurgitation. - Pulmonary arteries: PA peak pressure: 36mm Hg (S).  Palpitations thought to be benign.  Chronic LE edema for over a year.  On norvasc  Dose decreased but not stopped.  No chest pain or dyspnea Memory seems worse.  Tries to watch salt intake No history of LE trauma or DVT  ROS: Denies fever, malais, weight loss, blurry vision, decreased visual acuity, cough, sputum, SOB, hemoptysis, pleuritic pain, palpitaitons, heartburn, abdominal pain, melena, lower extremity edema, claudication, or rash.  All other systems reviewed and negative  General: Affect appropriate Anxious black female HEENT: normal Neck supple with no adenopathy JVP normal no bruits no thyromegaly Lungs clear with no wheezing and good diaphragmatic motion Heart:  S1/S2 no murmur, no rub, gallop or click PMI normal Abdomen: benighn, BS positve, no tenderness, no AAA no bruit.  No HSM or HJR Distal pulses intact with no bruits Plus two bilateral edema Neuro non-focal Skin warm and dry No muscular weakness   Current Outpatient Prescriptions  Medication Sig Dispense Refill  . acetaminophen (TYLENOL) 500 MG tablet Take 500 mg by mouth every 6 (six) hours as needed. For pain.      Marland Kitchen amoxicillin (AMOXIL) 500 MG capsule Take  500 mg by mouth as needed. dental      . aspirin EC 81 MG tablet Take 81 mg by mouth daily.        . calcium-vitamin D (OSCAL) 250-125 MG-UNIT per tablet Take 1 tablet by mouth daily.        . cholecalciferol (VITAMIN D) 1000 UNITS tablet Take 1,000 Units by mouth daily.      . fish oil-omega-3 fatty acids 1000 MG capsule Take 1 g by mouth daily.       Marland Kitchen levothyroxine (SYNTHROID, LEVOTHROID) 100 MCG tablet Take 100 mcg by mouth daily.        Marland Kitchen lisinopril (PRINIVIL,ZESTRIL) 40 MG tablet Take 1 tablet (40 mg total) by mouth daily.  60 tablet  11  . loratadine (CLARITIN) 10 MG tablet Take 10 mg by mouth daily.        . mometasone (NASONEX) 50 MCG/ACT nasal spray Place 2 sprays into the nose daily.  17 g  12  . Multiple Vitamin (MULITIVITAMIN WITH MINERALS) TABS Take 1 tablet by mouth daily.      Marland Kitchen DISCONTD: lisinopril (PRINIVIL,ZESTRIL) 20 MG tablet Take 1 tablet (20 mg total) by mouth daily.  30 tablet  11  . BENADRYL 25 MG tablet Take 1 tablet (25 mg total) by mouth every 6 (six) hours as needed for itching.  20 tablet  0    Allergies  Naproxen  Electrocardiogram: Event monitor 2/13 ordered by Dr Elease Hashimoto SR PAC/PVC no signifcant arrhythmia Baseline ECG 09/30/11  NSR rate 62 LAD no other abnormalities  Assessment and Plan

## 2012-02-21 NOTE — Assessment & Plan Note (Signed)
Resolved no arrhythmia on event monitor and intolerant to beta blockers

## 2012-03-02 ENCOUNTER — Encounter (INDEPENDENT_AMBULATORY_CARE_PROVIDER_SITE_OTHER): Payer: PRIVATE HEALTH INSURANCE

## 2012-03-02 DIAGNOSIS — R002 Palpitations: Secondary | ICD-10-CM

## 2012-03-02 DIAGNOSIS — R609 Edema, unspecified: Secondary | ICD-10-CM

## 2012-03-02 DIAGNOSIS — I10 Essential (primary) hypertension: Secondary | ICD-10-CM

## 2012-03-08 ENCOUNTER — Telehealth: Payer: Self-pay | Admitting: Cardiovascular Disease

## 2012-03-08 NOTE — Telephone Encounter (Signed)
Message copied by Burnell Blanks on Thu Mar 08, 2012  3:40 PM ------      Message from: Wendall Stade      Created: Tue Mar 06, 2012  3:13 PM       Normal venous duplex no DVT

## 2012-03-08 NOTE — Telephone Encounter (Signed)
Advised of doppler.  Patient states swelling in legs much better and wants to know if she needs to continue Lasix all of the time or just as needed.  States she thinks she is getting "dry" at times and feels more tired sometimes. Will forward to [facility] LPN and Dr Eden Emms for review

## 2012-03-08 NOTE — Telephone Encounter (Signed)
New Problem:    Patient returned a call regarding her PV results.  Please call back.

## 2012-03-09 NOTE — Telephone Encounter (Signed)
Pt to take lasix prn

## 2012-03-09 NOTE — Telephone Encounter (Signed)
It will be ok for her to take the Lasix as needed ( especially if she is feeling dehydrated)

## 2012-03-09 NOTE — Telephone Encounter (Signed)
SPOKE WITH PT  IS CONCERNED THAT SHE IS TAKING TO MUCH LASIX WOULD LIKE TO ONLY TAKE AS NEEDED EDEMA HAS IMPROVED  OCC  COMPLAINS OF  FATIGUE IS  SCHEDULED FOR BMET ON 03-20-12 AND F/U WITH DR Elease Hashimoto IN AUGUST ENCOURAGED PT TO KEEP RECORD OF B/P AND BRING THOSE READINGS WITH HER TO NEXT APPT  B/P WAS 146/66 TODAY  WILL FORWARD TO [facility] FOR REVIEW./CY

## 2012-03-09 NOTE — Telephone Encounter (Signed)
Would take every day as BP was high and norvasc stopped.  Needs F/U with Dr Elease Hashimoto its his patient and BMET in 3 weeks

## 2012-03-20 ENCOUNTER — Other Ambulatory Visit (INDEPENDENT_AMBULATORY_CARE_PROVIDER_SITE_OTHER): Payer: PRIVATE HEALTH INSURANCE

## 2012-03-20 DIAGNOSIS — I10 Essential (primary) hypertension: Secondary | ICD-10-CM

## 2012-03-20 DIAGNOSIS — R002 Palpitations: Secondary | ICD-10-CM

## 2012-03-20 LAB — BASIC METABOLIC PANEL
BUN: 13 mg/dL (ref 6–23)
CO2: 30 mEq/L (ref 19–32)
Chloride: 101 mEq/L (ref 96–112)
Creatinine, Ser: 0.9 mg/dL (ref 0.4–1.2)
Glucose, Bld: 115 mg/dL — ABNORMAL HIGH (ref 70–99)
Potassium: 4.3 mEq/L (ref 3.5–5.1)

## 2012-03-31 ENCOUNTER — Emergency Department (INDEPENDENT_AMBULATORY_CARE_PROVIDER_SITE_OTHER)
Admission: EM | Admit: 2012-03-31 | Discharge: 2012-03-31 | Disposition: A | Discharge status: Home / Self Care | Payer: PRIVATE HEALTH INSURANCE | Source: Home / Self Care | Attending: Emergency Medicine | Admitting: Emergency Medicine

## 2012-03-31 ENCOUNTER — Encounter (HOSPITAL_COMMUNITY): Payer: Self-pay

## 2012-03-31 DIAGNOSIS — I10 Essential (primary) hypertension: Secondary | ICD-10-CM

## 2012-03-31 LAB — POCT I-STAT, CHEM 8
BUN: 10 mg/dL (ref 6–23)
Calcium, Ion: 1.31 mmol/L — ABNORMAL HIGH (ref 1.13–1.30)
Creatinine, Ser: 1.1 mg/dL (ref 0.50–1.10)
Sodium: 139 mEq/L (ref 135–145)
TCO2: 28 mmol/L (ref 0–100)

## 2012-03-31 MED ORDER — FUROSEMIDE 40 MG PO TABS
40.0000 mg | ORAL_TABLET | Freq: Once | ORAL | Status: AC
  Administered 2012-03-31: 40 mg via ORAL

## 2012-03-31 MED ORDER — FUROSEMIDE 40 MG PO TABS
ORAL_TABLET | ORAL | Status: AC
  Filled 2012-03-31: qty 1

## 2012-03-31 MED ORDER — TORSEMIDE 10 MG PO TABS: 10.0000 mg | ORAL_TABLET | Freq: Every day | ORAL | Status: DC
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
## 2012-03-31 NOTE — Discharge Instructions (Signed)
Decrease your salt intake. diet and exercise will lower your blood pressure significantly. It is important to keep your blood pressure under good control, as having a elevated for prolonged periods of time significantly increases your risk of stroke, heart attacks, kidney damage, eye damage, and other problems. Return here in a week for blood pressure recheck if you're able to find a primary care physician by then. Return immediately to the ER if you start having chest pain, headache, problems seeing, problems talking, problems walking, if you feel like you're about to pass out, if you do pass out, if you have a seizure, or for any other concerns.. °

## 2012-04-02 LAB — POCT I-STAT, CHEM 8
BUN: 13 mg/dL (ref 6–23)
Calcium, Ion: 1.21 mmol/L (ref 1.13–1.30)
Creatinine, Ser: 1.1 mg/dL (ref 0.50–1.10)
TCO2: 29 mmol/L (ref 0–100)

## 2012-04-04 ENCOUNTER — Ambulatory Visit (INDEPENDENT_AMBULATORY_CARE_PROVIDER_SITE_OTHER): Payer: PRIVATE HEALTH INSURANCE | Admitting: Family Medicine

## 2012-04-04 VITALS — BP 130/76 | HR 62 | Temp 97.2°F | Resp 16 | Ht 62.5 in | Wt 134.0 lb

## 2012-04-04 DIAGNOSIS — I10 Essential (primary) hypertension: Secondary | ICD-10-CM

## 2012-04-04 DIAGNOSIS — F419 Anxiety disorder, unspecified: Secondary | ICD-10-CM

## 2012-04-04 DIAGNOSIS — F411 Generalized anxiety disorder: Secondary | ICD-10-CM

## 2012-04-04 MED ORDER — LEVOTHYROXINE SODIUM 100 MCG PO TABS: 100.0000 ug | ORAL_TABLET | Freq: Every day | ORAL | Status: DC

## 2012-04-08 ENCOUNTER — Encounter (HOSPITAL_COMMUNITY): Payer: Self-pay | Admitting: Emergency Medicine

## 2012-04-08 ENCOUNTER — Emergency Department (HOSPITAL_COMMUNITY)
Admission: EM | Admit: 2012-04-08 | Discharge: 2012-04-08 | Disposition: A | Discharge status: Home / Self Care | Payer: PRIVATE HEALTH INSURANCE | Attending: Emergency Medicine | Admitting: Emergency Medicine

## 2012-04-08 ENCOUNTER — Encounter: Payer: Self-pay | Admitting: Family Medicine

## 2012-04-08 ENCOUNTER — Emergency Department (HOSPITAL_COMMUNITY): Payer: PRIVATE HEALTH INSURANCE

## 2012-04-08 DIAGNOSIS — I10 Essential (primary) hypertension: Secondary | ICD-10-CM | POA: Insufficient documentation

## 2012-04-08 DIAGNOSIS — R531 Weakness: Secondary | ICD-10-CM

## 2012-04-08 DIAGNOSIS — F29 Unspecified psychosis not due to a substance or known physiological condition: Secondary | ICD-10-CM | POA: Insufficient documentation

## 2012-04-08 DIAGNOSIS — Z853 Personal history of malignant neoplasm of breast: Secondary | ICD-10-CM | POA: Insufficient documentation

## 2012-04-08 DIAGNOSIS — R51 Headache: Secondary | ICD-10-CM | POA: Insufficient documentation

## 2012-04-08 DIAGNOSIS — R5381 Other malaise: Secondary | ICD-10-CM | POA: Insufficient documentation

## 2012-04-08 DIAGNOSIS — I160 Hypertensive urgency: Secondary | ICD-10-CM

## 2012-04-08 LAB — DIFFERENTIAL
Eosinophils Absolute: 0.2 10*3/uL (ref 0.0–0.7)
Lymphocytes Relative: 35 % (ref 12–46)
Lymphs Abs: 1.3 10*3/uL (ref 0.7–4.0)
Monocytes Relative: 5 % (ref 3–12)
Neutro Abs: 2.1 10*3/uL (ref 1.7–7.7)
Neutrophils Relative %: 56 % (ref 43–77)

## 2012-04-08 LAB — POCT I-STAT, CHEM 8
BUN: 17 mg/dL (ref 6–23)
Calcium, Ion: 1.27 mmol/L (ref 1.13–1.30)
HCT: 39 % (ref 36.0–46.0)
Sodium: 139 mEq/L (ref 135–145)
TCO2: 32 mmol/L (ref 0–100)

## 2012-04-08 LAB — COMPREHENSIVE METABOLIC PANEL
Alkaline Phosphatase: 57 U/L (ref 39–117)
BUN: 16 mg/dL (ref 6–23)
CO2: 29 mEq/L (ref 19–32)
Chloride: 100 mEq/L (ref 96–112)
GFR calc Af Amer: 60 mL/min — ABNORMAL LOW (ref >90)
GFR calc non Af Amer: 52 mL/min — ABNORMAL LOW (ref >90)
Glucose, Bld: 100 mg/dL — ABNORMAL HIGH (ref 70–99)
Potassium: 4 mEq/L (ref 3.5–5.1)
Total Bilirubin: 0.3 mg/dL (ref 0.3–1.2)

## 2012-04-08 LAB — URINALYSIS, ROUTINE W REFLEX MICROSCOPIC
Glucose, UA: NEGATIVE mg/dL
Hgb urine dipstick: NEGATIVE
Leukocytes, UA: NEGATIVE
Protein, ur: NEGATIVE mg/dL
Specific Gravity, Urine: 1.005 (ref 1.005–1.030)
Urobilinogen, UA: 0.2 mg/dL (ref 0.0–1.0)

## 2012-04-08 LAB — GLUCOSE, CAPILLARY: Glucose-Capillary: 100 mg/dL — ABNORMAL HIGH (ref 70–99)

## 2012-04-08 LAB — CBC
Hemoglobin: 12.5 g/dL (ref 12.0–15.0)
MCH: 31.3 pg (ref 26.0–34.0)
RBC: 4 MIL/uL (ref 3.87–5.11)

## 2012-04-08 LAB — CK TOTAL AND CKMB (NOT AT ARMC): Total CK: 51 U/L (ref 7–177)

## 2012-04-08 LAB — PROTIME-INR: INR: 1.09 (ref 0.00–1.49)

## 2012-04-08 MED ORDER — ACETAMINOPHEN 325 MG PO TABS
650.0000 mg | ORAL_TABLET | Freq: Once | ORAL | Status: AC
  Administered 2012-04-08: 650 mg via ORAL
  Filled 2012-04-08: qty 2
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
## 2012-04-08 NOTE — ED Notes (Signed)
Pt in radiology 

## 2012-04-08 NOTE — ED Provider Notes (Signed)
History     CSN: [id number]  Arrival date & time 04/08/12  1157   First MD Initiated Contact with Patient 04/08/12 1158      Chief Complaint  Patient presents with  . Hypertension  . Weakness    (Consider location/radiation/quality/duration/timing/severity/associated sxs/prior treatment) HPI Comments: 78 y/o hypertensive female presents to ED with elevated blood pressure. States she was taking her blood pressure this morning and it was 188/78 so she called EMS. Admits to headache around her eyes but denies any blurred, double, or decreased vision. Admits to bilateral lower extremity weakness. States when she walks she just does not feel right. Denies chest pain or sob, but "can tell when her blood pressure is high when she breaths". Admits to slight confusion. Denies fever, nausea, abdominal pain, urinary complaints, numbness or tingling in her extremities, leg swelling, heart palpitations. She was seen at urgent care last week where they put her on torsemide and told her to f/u with her cardiologist Dr. Rockey Situ. States she takes her medication daily.  The history is provided by the patient.    Past Medical History  Diagnosis Date  . Hypertension   . Thyroid disease   . Anemia   . Thrombocytopenia   . Osteoarthritis     left knee  . Hypothyroidism   . Hypercholesterolemia   . History of breast cancer   . Idiopathic thrombocytopenic purpura     A questionable history of idiopathic thrombocytopenic purpura  . Acute blood loss anemia     secondary to surgery, requiring blood transfusion  . Hypokalemia     treated  . Leukocytosis   . Pyrexia     Past Surgical History  Procedure Date  . Cholecystectomy   . Colon surgery   . Knee reconstruction, medial patellar femoral ligament   . Total knee     History reviewed. No pertinent family history.  History  Substance Use Topics  . Smoking status: Former Games developer  . Smokeless tobacco: Not on file  . Alcohol Use: No    OB  History    Grav Para Term Preterm Abortions TAB SAB Ect Mult Living                  Review of Systems  Constitutional: Negative for fatigue.  Eyes: Negative for visual disturbance.  Respiratory: Negative for shortness of breath.   Cardiovascular: Negative for chest pain and leg swelling.  Gastrointestinal: Negative for nausea and abdominal pain.  Genitourinary:       Denies any urinary complaints  Neurological: Positive for weakness and headaches.  Psychiatric/Behavioral: Positive for confusion.    Allergies  Naproxen  Home Medications   Current Outpatient Rx  Name Route Sig Dispense Refill  . ASPIRIN EC 81 MG PO TBEC Oral Take 81 mg by mouth daily.      Marland Kitchen CALCIUM-VITAMIN D 250-125 MG-UNIT PO TABS Oral Take 1 tablet by mouth daily.      Marland Kitchen VITAMIN D 1000 UNITS PO TABS Oral Take 1,000 Units by mouth daily.    . OMEGA-3 FATTY ACIDS 1000 MG PO CAPS Oral Take 1 g by mouth daily.     Marland Kitchen LEVOTHYROXINE SODIUM 100 MCG PO TABS Oral Take 1 tablet (100 mcg total) by mouth daily. 90 tablet 3  . LISINOPRIL 40 MG PO TABS Oral Take 1 tablet (40 mg total) by mouth daily. 60 tablet 11  . LORATADINE 10 MG PO TABS Oral Take 10 mg by mouth daily.      Marland Kitchen  MOMETASONE FUROATE 50 MCG/ACT NA SUSP Nasal Place 2 sprays into the nose daily. 17 g 12  . ADULT MULTIVITAMIN W/MINERALS CH Oral Take 1 tablet by mouth daily.    . TORSEMIDE 10 MG PO TABS Oral Take 1 tablet (10 mg total) by mouth daily. 30 tablet 0    SpO2 98%  Physical Exam  Constitutional: She is oriented to person, place, and time. She appears well-developed and well-nourished. No distress.  HENT:  Head: Normocephalic and atraumatic.  Mouth/Throat: Oropharynx is clear and moist.  Eyes: Conjunctivae and EOM are normal. Pupils are equal, round, and reactive to light. Right conjunctiva is not injected. Left conjunctiva is not injected.  Neck: Neck supple. No JVD present.  Cardiovascular: Normal rate, regular rhythm, normal heart sounds and  intact distal pulses.        No extremity edema present  Pulmonary/Chest: Effort normal and breath sounds normal. She has no wheezes. She has no rhonchi. She has no rales.  Abdominal: Soft. Bowel sounds are normal. There is no tenderness.  Neurological: She is alert and oriented to person, place, and time. She has normal strength and normal reflexes. No cranial nerve deficit or sensory deficit.  Skin: Skin is warm and dry.  Psychiatric: She has a normal mood and affect. Her speech is normal and behavior is normal.       Slightly confused    ED Course  Procedures (including critical care time)   Labs Reviewed  PROTIME-INR  APTT  CBC  DIFFERENTIAL  COMPREHENSIVE METABOLIC PANEL  CK TOTAL AND CKMB  TROPONIN I  URINALYSIS, ROUTINE W REFLEX MICROSCOPIC   Results for orders placed during the hospital encounter of 04/08/12  PROTIME-INR      Component Value Range   Prothrombin Time 14.3  11.6 - 15.2 seconds   INR 1.09  0.00 - 1.49  APTT      Component Value Range   aPTT 31  24 - 37 seconds  CBC      Component Value Range   WBC 3.8 (*) 4.0 - 10.5 K/uL   RBC 4.00  3.87 - 5.11 MIL/uL   Hemoglobin 12.5  12.0 - 15.0 g/dL   HCT 40.9  81.1 - 91.4 %   MCV 94.5  78.0 - 100.0 fL   MCH 31.3  26.0 - 34.0 pg   MCHC 33.1  30.0 - 36.0 g/dL   RDW 78.2  95.6 - 21.3 %   Platelets 233  150 - 400 K/uL  DIFFERENTIAL      Component Value Range   Neutrophils Relative 56  43 - 77 %   Neutro Abs 2.1  1.7 - 7.7 K/uL   Lymphocytes Relative 35  12 - 46 %   Lymphs Abs 1.3  0.7 - 4.0 K/uL   Monocytes Relative 5  3 - 12 %   Monocytes Absolute 0.2  0.1 - 1.0 K/uL   Eosinophils Relative 4  0 - 5 %   Eosinophils Absolute 0.2  0.0 - 0.7 K/uL   Basophils Relative 1  0 - 1 %   Basophils Absolute 0.0  0.0 - 0.1 K/uL  COMPREHENSIVE METABOLIC PANEL      Component Value Range   Sodium 139  135 - 145 mEq/L   Potassium 4.0  3.5 - 5.1 mEq/L   Chloride 100  96 - 112 mEq/L   CO2 29  19 - 32 mEq/L   Glucose,  Bld 100 (*) 70 - 99 mg/dL   BUN  16  6 - 23 mg/dL   Creatinine, Ser 1.61  0.50 - 1.10 mg/dL   Calcium 09.6  8.4 - 04.5 mg/dL   Total Protein 6.9  6.0 - 8.3 g/dL   Albumin 3.6  3.5 - 5.2 g/dL   AST 24  0 - 37 U/L   ALT 14  0 - 35 U/L   Alkaline Phosphatase 57  39 - 117 U/L   Total Bilirubin 0.3  0.3 - 1.2 mg/dL   GFR calc non Af Amer 52 (*) >90 mL/min   GFR calc Af Amer 60 (*) >90 mL/min  CK TOTAL AND CKMB      Component Value Range   Total CK 51  7 - 177 U/L   CK, MB 1.5  0.3 - 4.0 ng/mL   Relative Index RELATIVE INDEX IS INVALID  0.0 - 2.5  TROPONIN I      Component Value Range   Troponin I <0.30  <0.30 ng/mL  URINALYSIS, ROUTINE W REFLEX MICROSCOPIC      Component Value Range   Color, Urine YELLOW  YELLOW   APPearance CLEAR  CLEAR   Specific Gravity, Urine 1.005  1.005 - 1.030   pH 7.0  5.0 - 8.0   Glucose, UA NEGATIVE  NEGATIVE mg/dL   Hgb urine dipstick NEGATIVE  NEGATIVE   Bilirubin Urine NEGATIVE  NEGATIVE   Ketones, ur NEGATIVE  NEGATIVE mg/dL   Protein, ur NEGATIVE  NEGATIVE mg/dL   Urobilinogen, UA 0.2  0.0 - 1.0 mg/dL   Nitrite NEGATIVE  NEGATIVE   Leukocytes, UA NEGATIVE  NEGATIVE  GLUCOSE, CAPILLARY      Component Value Range   Glucose-Capillary 100 (*) 70 - 99 mg/dL  POCT I-STAT, CHEM 8      Component Value Range   Sodium 139  135 - 145 mEq/L   Potassium 3.9  3.5 - 5.1 mEq/L   Chloride 97  96 - 112 mEq/L   BUN 17  6 - 23 mg/dL   Creatinine, Ser 4.09  0.50 - 1.10 mg/dL   Glucose, Bld 811 (*) 70 - 99 mg/dL   Calcium, Ion 9.14  7.82 - 1.30 mmol/L   TCO2 32  0 - 100 mmol/L   Hemoglobin 13.3  12.0 - 15.0 g/dL   HCT 95.6  21.3 - 08.6 %    Dg Chest 2 View  04/08/2012  *RADIOLOGY REPORT*  Clinical Data: Hypertension, weakness  CHEST - 2 VIEW  Comparison: CT chest dated 08/15/2011  Findings: Hyperinflation.  Lungs are essentially clear.  No pleural effusion or pneumothorax.  Cardiomediastinal silhouette is within normal limits.  Mild degenerative changes of the  visualized thoracolumbar spine.  IMPRESSION: No evidence of acute cardiopulmonary disease.  Original Report Authenticated By: Charline Bills, M.D.   Ct Head Wo Contrast  04/08/2012  *RADIOLOGY REPORT*  Clinical Data: Hypertension, ongoing weakness, history of breast cancer  CT HEAD WITHOUT CONTRAST  Technique:  Contiguous axial images were obtained from the base of the skull through the vertex without contrast.  Comparison: 06/11/2008  Findings:  Grossly unchanged periventricular hypodensities compatible with microvascular ischemic disease.  Old lacunar infarcts within the bilateral basal ganglia, left greater than right.  Bilateral basal ganglial calcifications.  The gray-[place] differentiation is otherwise well maintained without CT evidence of acute large territory infarct.  Persistent low density prominence within the sella suggests a partially empty sella.  Normal size and configuration of the ventricles and basilar cisterns.  No midline shift.  The paranasal  sinuses and mastoid air cells are normal. Regional soft tissues are normal.  Post bilateral cataract surgery. No displaced calvarial fracture.  IMPRESSION: 1.  Microvascular ischemic disease without acute intracranial process. 2.  Partial empty sella again suspected.  Original Report Authenticated By: Waynard Reeds, M.D.    Date: 04/08/2012  Rate: 60  Rhythm: normal sinus rhythm  QRS Axis: normal  Intervals: normal  ST/T Wave abnormalities: normal  Conduction Disutrbances:none  Narrative Interpretation: LVH  Old EKG Reviewed: no significant changes   1:57 PM Blood pressure 179/68 at this time 2:26 PM Patient resting comfortably in bed. Blood pressure 164/49.   1. Hypertensive urgency   2. Headache   3. Weakness       MDM  Discussed results with patient in detail. Patient understands results and is very thankful for reviewing them with her. No acute findings suggesting stroke or hemorrhage. Blood pressure has stabilized. She  has an appt with Dr. Rockey Situ on Tuesday. Still with headache. Will give tylenol and food and discharge home. Case discussed with Dr. Alto Denver who also evaluated patient who agrees with plan of care. 3:44 PM Headache resolving while eating a sandwich. Feeling much better. Received tylenol. Stable for discharge.        Trevor Mace, PA-C 04/08/12 1545

## 2012-04-08 NOTE — ED Notes (Signed)
Ongoing for several months with hypertension. Pt brought in by EMS with c/o hypertension and weakness in legs.

## 2012-04-10 ENCOUNTER — Encounter: Payer: Self-pay | Admitting: Cardiovascular Disease

## 2012-04-10 ENCOUNTER — Ambulatory Visit (INDEPENDENT_AMBULATORY_CARE_PROVIDER_SITE_OTHER): Payer: Medicaid Other | Admitting: Cardiovascular Disease

## 2012-04-10 VITALS — BP 140/70 | HR 69 | Ht 60.5 in | Wt 133.0 lb

## 2012-04-10 DIAGNOSIS — I10 Essential (primary) hypertension: Secondary | ICD-10-CM

## 2012-04-10 NOTE — Progress Notes (Signed)
Lisa Sawyer Date of Birth  1931-08-31 [facility]     [place] [facility]  [address]    [address]   [address] [place], [place]  [address]    [place], [place]  [address] [phone]  Fax  [phone]  [phone]  Fax [phone]   Problems: 1. Hypertension 2. Palpitations 3. Hypothyroidism  History of Present Illness:  She complains of lots of fatigue.  Palpitations are better. She is under lots of stress with family issues. She's taking care of multiple family members who have not been able to take care of themselves.  Her BP remains elevated.  Current Outpatient Prescriptions on File Prior to Visit  Medication Sig Dispense Refill  . aspirin EC 81 MG tablet Take 81 mg by mouth daily.        . calcium-vitamin D (OSCAL) 250-125 MG-UNIT per tablet Take 1 tablet by mouth daily.        . cholecalciferol (VITAMIN D) 1000 UNITS tablet Take 1,000 Units by mouth daily.      . fish oil-omega-3 fatty acids 1000 MG capsule Take 1 g by mouth daily.       Marland Kitchen levothyroxine (SYNTHROID, LEVOTHROID) 100 MCG tablet Take 1 tablet (100 mcg total) by mouth daily.  90 tablet  3  . lisinopril (PRINIVIL,ZESTRIL) 40 MG tablet Take 1 tablet (40 mg total) by mouth daily.  60 tablet  11  . loratadine (CLARITIN) 10 MG tablet Take 10 mg by mouth daily.        . mometasone (NASONEX) 50 MCG/ACT nasal spray Place 2 sprays into the nose daily as needed. allergies      . Multiple Vitamin (MULITIVITAMIN WITH MINERALS) TABS Take 1 tablet by mouth daily.      Marland Kitchen torsemide (DEMADEX) 10 MG tablet Take 1 tablet (10 mg total) by mouth daily.  30 tablet  0  . DISCONTD: BENADRYL 25 MG tablet Take 1 tablet (25 mg total) by mouth every 6 (six) hours as needed for itching.  20 tablet  0  . DISCONTD: furosemide (LASIX) 20 MG tablet Take 1 tablet (20 mg total) by mouth daily.  90 tablet  3    Allergies  Allergen Reactions  . Naproxen Other (See Comments)    unknown    Past Medical History    Diagnosis Date  . Hypertension   . Thyroid disease   . Anemia   . Thrombocytopenia   . Osteoarthritis     left knee  . Hypothyroidism   . Hypercholesterolemia   . History of breast cancer   . Idiopathic thrombocytopenic purpura     A questionable history of idiopathic thrombocytopenic purpura  . Acute blood loss anemia     secondary to surgery, requiring blood transfusion  . Hypokalemia     treated  . Leukocytosis   . Pyrexia     Past Surgical History  Procedure Date  . Cholecystectomy   . Colon surgery   . Knee reconstruction, medial patellar femoral ligament   . Total knee     History  Smoking status  . Former Smoker  Smokeless tobacco  . Not on file    History  Alcohol Use No    No family history on file.  Reviw of Systems:  Reviewed in the HPI.  All other systems are negative.  Physical Exam: Blood pressure 182/82, pulse 69, height 5' 0.5" (1.537 m), weight 133 lb (60.328 kg). General: Well developed, well nourished, in no  acute distress.  Head: Normocephalic, atraumatic, sclera non-icteric, mucus membranes are moist,   Neck: Supple. Carotids are 2 + without bruits. No JVD  Lungs: Clear bilaterally to auscultation.  Heart: regular rate.  normal  S1 S2. No murmurs, gallops or rubs.  Abdomen: Soft, non-tender, non-distended with normal bowel sounds. No hepatomegaly. No rebound/guarding. No masses.  Msk:  Strength and tone are normal  Extremities: No clubbing or cyanosis. No edema.  Distal pedal pulses are 2+ and equal bilaterally.  Neuro: Alert and oriented X 3. Moves all extremities spontaneously.  Psych:  Responds to questions appropriately with a normal affect.  ECG:  Assessment / Plan:

## 2012-04-10 NOTE — Assessment & Plan Note (Signed)
Her blood pressure came down fairly nicely after she rested for a couple of minutes. I suspect a lot of her blood pressure elevations are due to family stress.  I've asked her to exercise on a regular basis. He'll try to deal with the stress lives in her life. I'll see her again in several months for followup office visit.

## 2012-04-10 NOTE — Patient Instructions (Addendum)
Your physician wants you to follow-up in: 6 months  You will receive a reminder letter in the mail two months in advance. If you don't receive a letter, please call our office to schedule the follow-up appointment.  Your physician recommends that you return for a FASTING lipid profile: 6 months   

## 2012-04-12 NOTE — ED Provider Notes (Signed)
Medical screening examination/treatment/procedure(s) were conducted as a shared visit with non-physician practitioner(s) and myself.  I personally evaluated the patient during the encounter   Cyndra Numbers, MD 04/12/12 [phone]

## 2012-05-05 ENCOUNTER — Other Ambulatory Visit (HOSPITAL_COMMUNITY): Payer: Self-pay | Admitting: Cardiovascular Disease

## 2012-05-07 NOTE — Telephone Encounter (Signed)
Fax Received. Refill Completed. Lisa Sawyer (R.M.A)   

## 2012-05-23 ENCOUNTER — Encounter: Payer: Self-pay | Admitting: Family Medicine

## 2012-05-23 ENCOUNTER — Ambulatory Visit (INDEPENDENT_AMBULATORY_CARE_PROVIDER_SITE_OTHER): Payer: PRIVATE HEALTH INSURANCE | Admitting: Family Medicine

## 2012-05-23 VITALS — BP 160/78 | HR 83 | Temp 97.7°F | Resp 20 | Ht 62.0 in | Wt 133.0 lb

## 2012-05-23 DIAGNOSIS — F411 Generalized anxiety disorder: Secondary | ICD-10-CM

## 2012-05-23 DIAGNOSIS — I10 Essential (primary) hypertension: Secondary | ICD-10-CM

## 2012-05-23 DIAGNOSIS — J309 Allergic rhinitis, unspecified: Secondary | ICD-10-CM

## 2012-05-23 NOTE — Progress Notes (Signed)
S: This 78 y.o. AA female with HTN and chronic anxiety; Lisa Sawyer had ED visit on 04/08/12 and saw Dr. Melburn Popper on 04/10/12. BP measurement s always tend to come down to normal after pt has time to sit and relax.  Today, Lisa Sawyer c/o left ear fullness and allergy symptoms. Takes Loratidine and uses Nasonex. No rhinorrhea/ fever/ sneezing/ chest congestion/ cough/ SOB.  Re: HTN- pt takes BP med at night. Lisa Sawyer was seen by Dr. Melburn Popper last month and advised to minimized family stress. Lisa Sawyer is starting to "let go of it". BP last night= 118/70. [facility] : 161/ 68.  Pt plans to join [employer] program at the [place].  Pt is having dental work and wants to know if Lisa Sawyer will need to have any coverage of antibiotics before procedure. (I have advised that Lisa Sawyer get more specific info from the dentist then have dentist contact Dr. Sallee Provencal office; ECHO done in Feb 2013 showed mild MR and TR- "fairly normal ECHO" per Dr. Melburn Popper).  ROS: As per HPI.                   O:   Filed Vitals:     BP  171/73  &  160/78        P 83         R 20   05/23/12 0934  Height: 5\' 2"  (1.575 m)  Weight: 133 lb (60.328 kg)    GEN: In NAD: appears slightly anxious. HENT: [place]/AT;EOMI with clear conj/sclerae; TMs dull with chronic scarring, absent effusions or other abnormality NECK: No LAN. COR: RRR; trace edema. LUNGS: CTA; no rales or wheezes. NEURO: Nonfocal.  A/P: 1. HTN (hypertension)  Continue current medications  2. Allergic rhinitis, cause unspecified   3. Anxiety state, unspecified  Continue to work on stress reduction and off-loading responsibility for relative's care to agency that is equipped to address her needs.

## 2012-05-25 ENCOUNTER — Encounter: Payer: Self-pay | Admitting: Family Medicine

## 2012-05-27 ENCOUNTER — Emergency Department (HOSPITAL_COMMUNITY)
Admission: EM | Admit: 2012-05-27 | Discharge: 2012-05-27 | Disposition: A | Discharge status: Home / Self Care | Payer: PRIVATE HEALTH INSURANCE | Attending: Emergency Medicine | Admitting: Emergency Medicine

## 2012-05-27 ENCOUNTER — Encounter (HOSPITAL_COMMUNITY): Payer: Self-pay | Admitting: Emergency Medicine

## 2012-05-27 DIAGNOSIS — N119 Chronic tubulo-interstitial nephritis, unspecified: Secondary | ICD-10-CM | POA: Insufficient documentation

## 2012-05-27 DIAGNOSIS — E7800 Pure hypercholesterolemia, unspecified: Secondary | ICD-10-CM | POA: Insufficient documentation

## 2012-05-27 DIAGNOSIS — E039 Hypothyroidism, unspecified: Secondary | ICD-10-CM | POA: Insufficient documentation

## 2012-05-27 DIAGNOSIS — M1990 Unspecified osteoarthritis, unspecified site: Secondary | ICD-10-CM | POA: Insufficient documentation

## 2012-05-27 DIAGNOSIS — Z79899 Other long term (current) drug therapy: Secondary | ICD-10-CM | POA: Insufficient documentation

## 2012-05-27 DIAGNOSIS — N390 Urinary tract infection, site not specified: Secondary | ICD-10-CM

## 2012-05-27 DIAGNOSIS — I10 Essential (primary) hypertension: Secondary | ICD-10-CM | POA: Insufficient documentation

## 2012-05-27 DIAGNOSIS — Z7982 Long term (current) use of aspirin: Secondary | ICD-10-CM | POA: Insufficient documentation

## 2012-05-27 HISTORY — DX: Panic disorder (episodic paroxysmal anxiety): F41.0

## 2012-05-27 LAB — URINE MICROSCOPIC-ADD ON

## 2012-05-27 LAB — CBC WITH DIFFERENTIAL/PLATELET
Eosinophils Absolute: 0.2 10*3/uL (ref 0.0–0.7)
Hemoglobin: 12 g/dL (ref 12.0–15.0)
Lymphocytes Relative: 25 % (ref 12–46)
Lymphs Abs: 1.5 10*3/uL (ref 0.7–4.0)
Monocytes Relative: 5 % (ref 3–12)
Neutro Abs: 4 10*3/uL (ref 1.7–7.7)
Neutrophils Relative %: 66 % (ref 43–77)
Platelets: 289 10*3/uL (ref 150–400)
RBC: 3.94 MIL/uL (ref 3.87–5.11)
WBC: 6 10*3/uL (ref 4.0–10.5)

## 2012-05-27 LAB — BASIC METABOLIC PANEL
Calcium: 10.1 mg/dL (ref 8.4–10.5)
GFR calc Af Amer: 53 mL/min — ABNORMAL LOW (ref >90)
GFR calc non Af Amer: 45 mL/min — ABNORMAL LOW (ref >90)
Glucose, Bld: 91 mg/dL (ref 70–99)
Potassium: 4.3 mEq/L (ref 3.5–5.1)
Sodium: 137 mEq/L (ref 135–145)

## 2012-05-27 LAB — URINALYSIS, ROUTINE W REFLEX MICROSCOPIC
Glucose, UA: NEGATIVE mg/dL
Protein, ur: NEGATIVE mg/dL
Specific Gravity, Urine: 1.01 (ref 1.005–1.030)
pH: 7.5 (ref 5.0–8.0)

## 2012-05-27 MED ORDER — SULFAMETHOXAZOLE-TRIMETHOPRIM 800-160 MG PO TABS: 1.0000 | ORAL_TABLET | Freq: Two times a day (BID) | ORAL | Status: DC

## 2012-05-27 MED ORDER — SULFAMETHOXAZOLE-TMP DS 800-160 MG PO TABS
1.0000 | ORAL_TABLET | Freq: Once | ORAL | Status: AC
  Administered 2012-05-27: 1 via ORAL
  Filled 2012-05-27: qty 1

## 2012-05-27 NOTE — ED Notes (Addendum)
Pt stated to me that is very worried about her insurance since she has been coming back to the hospital so often. Also she stated to me that she is very stressed, but did not say what was stressing her at home. I placed her pocketbook, shoes, bra, tshirt in a belonging bag

## 2012-05-27 NOTE — ED Notes (Addendum)
ZOX:[id number] date:05/27/12<BR>Expected time: 8:33 AM<BR>Means of arrival:Ambulance<BR>Comments:<BR>Anxiety and HTN

## 2012-05-27 NOTE — ED Notes (Addendum)
Reports woke up this am stated felt pressure to head and points to top of head, not feeling well unable to describe, stated tried to calm herself down called neighboro to see if she could come over and talk to her. Then stated she felt like her breathing was different described "breathing harder", points to left side of chest a slight pain or sensation then stated she did not want to describe as a pain 2/10 duration < min. now pain is 0/10, feels weak in the legs when she stands up to walk.  Patient reports she went to bed fine and woke up stress concern of her medications, her health, her having to take care of her cousin. Stated she feels she just needs to be and stay with her children she would feel better.

## 2012-05-27 NOTE — ED Notes (Signed)
Watched patient ambulate without any problems to and from rest room. Presently denies any symptoms that she arrived with.

## 2012-05-27 NOTE — ED Provider Notes (Addendum)
History     CSN: [id number]  Arrival date & time 05/27/12  4098   First MD Initiated Contact with Patient 05/27/12 [phone]      No chief complaint on file.   (Consider location/radiation/quality/duration/timing/severity/associated sxs/prior treatment) The history is provided by the patient and medical records.  Pt c/o not feeling well.  It is a very vague feeling.  She thinks she is anxious.  She woke up today and checked her bp.  The sbp was > 200.   Then shed developed her sxs.  She denies pain.  She denies n/v/f/c/cough/sob/uti sxs.  She denies any recent illness. She lives alone.  She has been having similar sxs since last year.    Past Medical History  Diagnosis Date  . Hypertension   . Thyroid disease   . Anemia   . Thrombocytopenia   . Osteoarthritis     left knee  . Hypothyroidism   . Hypercholesterolemia   . History of breast cancer   . Idiopathic thrombocytopenic purpura     A questionable history of idiopathic thrombocytopenic purpura  . Acute blood loss anemia     secondary to surgery, requiring blood transfusion  . Hypokalemia     treated  . Leukocytosis   . Pyrexia   . Anxiety attack     Past Surgical History  Procedure Date  . Cholecystectomy   . Colon surgery   . Knee reconstruction, medial patellar femoral ligament   . Total knee     No family history on file.  History  Substance Use Topics  . Smoking status: Former Games developer  . Smokeless tobacco: Not on file  . Alcohol Use: No    OB History    Grav Para Term Preterm Abortions TAB SAB Ect Mult Living                  Review of Systems  Constitutional: Negative for fever, chills and diaphoresis.  HENT: Negative for neck pain.   Eyes: Negative for visual disturbance.  Respiratory: Negative for cough and shortness of breath.   Cardiovascular: Negative for chest pain and leg swelling.  Gastrointestinal: Negative for nausea, vomiting, abdominal pain and diarrhea.  Genitourinary: Negative for  dysuria.  Musculoskeletal: Negative for back pain.  Skin: Negative for rash.  Neurological: Positive for weakness. Negative for headaches.  Psychiatric/Behavioral: Negative for confusion.  All other systems reviewed and are negative.    Allergies  Naproxen  Home Medications   Current Outpatient Rx  Name Route Sig Dispense Refill  . ASPIRIN EC 81 MG PO TBEC Oral Take 81 mg by mouth daily.      Marland Kitchen CALCIUM-VITAMIN D 250-125 MG-UNIT PO TABS Oral Take 1 tablet by mouth daily.      . OMEGA-3 FATTY ACIDS 1000 MG PO CAPS Oral Take 1 g by mouth daily.     Marland Kitchen LEVOTHYROXINE SODIUM 100 MCG PO TABS Oral Take 1 tablet (100 mcg total) by mouth daily. 90 tablet 3  . LISINOPRIL 40 MG PO TABS Oral Take 1 tablet (40 mg total) by mouth daily. 60 tablet 11  . LORATADINE 10 MG PO TABS Oral Take 10 mg by mouth daily.     . MOMETASONE FUROATE 50 MCG/ACT NA SUSP Nasal Place 2 sprays into the nose daily as needed. allergies    . ADULT MULTIVITAMIN W/MINERALS CH Oral Take 1 tablet by mouth daily.    . TORSEMIDE 20 MG PO TABS Oral Take 10 mg by mouth daily.  BP 187/56  Pulse 63  Temp 98.4 F (36.9 C) (Oral)  Resp 22  Ht 5\' 2"  (1.575 m)  Wt 133 lb (60.328 kg)  BMI 24.33 kg/m2  SpO2 98%  Physical Exam  Nursing note and vitals reviewed. Constitutional: She is oriented to person, place, and time. She appears well-developed and well-nourished. No distress.  HENT:  Head: Normocephalic and atraumatic.  Eyes: Conjunctivae normal and EOM are normal.  Neck: Normal range of motion. Neck supple.  Cardiovascular: Normal rate, regular rhythm and intact distal pulses.   No murmur heard. Pulmonary/Chest: Effort normal and breath sounds normal. No respiratory distress.  Abdominal: Soft. Bowel sounds are normal. She exhibits no distension.  Musculoskeletal: Normal range of motion. She exhibits no edema and no tenderness.  Neurological: She is alert and oriented to person, place, and time. No cranial nerve  deficit.       Strength 5/5 in all extremities  Skin: Skin is warm and dry.  Psychiatric: She has a normal mood and affect. Thought content normal.    ED Course  Procedures (including critical care time) Generalized sensation of not feeling well. Generalized, nonfocal weakness.  Normal ms. No imaging indicated. Will check labs, ecg, ua in this elderly female.     Labs Reviewed  BASIC METABOLIC PANEL  CBC WITH DIFFERENTIAL  URINALYSIS, ROUTINE W REFLEX MICROSCOPIC   No results found.   No diagnosis found.  ECG Normal sinus rhythm at 62 beats per minute. Left axis deviation. Normal intervals. Normal.  ST and T waves    MDM  Weakness UTI        Cheri Guppy, MD 05/27/12 1001  Cheri Guppy, MD 05/27/12 1114

## 2012-05-28 ENCOUNTER — Telehealth: Payer: Self-pay

## 2012-05-28 NOTE — Telephone Encounter (Signed)
Please advise I will call patient if you need me to.

## 2012-05-28 NOTE — Telephone Encounter (Signed)
Dr. Audria Nine  Patient went to the ED over the weekend for pains in the chest.   She is hoping you will be able to assist her in obtaining the records and findings. Please call [phone]

## 2012-05-29 ENCOUNTER — Telehealth: Payer: Self-pay | Admitting: Family Medicine

## 2012-05-29 NOTE — Telephone Encounter (Addendum)
I reveiewed the ED noted and evaluation was normal except possible UTI. She was prescribed an antibiotic which she should be taking along with increased liquid/ water intake. SBP at beginning of ED visit~ 186.  UTI in elderly can cause generalized weakness which was her main complaint in addition to elevated BP. If she was advised to follow-up with [place], please contact her to schedule an appt once she has finished the antibiotic.

## 2012-05-29 NOTE — Telephone Encounter (Signed)
LMOM to CB. 

## 2012-05-29 NOTE — Telephone Encounter (Signed)
I have reviewed the most recent ED visit and advised information to give pt; she also saw Dr. Eden Emms recently; he feels that a lot of her symptoms are anxiety-related.

## 2012-05-30 NOTE — Telephone Encounter (Signed)
Spoke with pt and advised note from Dr Audria Nine. Pt understood and will make an appointment.

## 2012-06-04 ENCOUNTER — Emergency Department (HOSPITAL_COMMUNITY)
Admission: EM | Admit: 2012-06-04 | Discharge: 2012-06-04 | Disposition: A | Discharge status: Home / Self Care | Payer: PRIVATE HEALTH INSURANCE | Attending: Emergency Medicine | Admitting: Emergency Medicine

## 2012-06-04 ENCOUNTER — Encounter (HOSPITAL_COMMUNITY): Payer: Self-pay | Admitting: *Deleted

## 2012-06-04 DIAGNOSIS — R5381 Other malaise: Secondary | ICD-10-CM | POA: Insufficient documentation

## 2012-06-04 DIAGNOSIS — I10 Essential (primary) hypertension: Secondary | ICD-10-CM | POA: Insufficient documentation

## 2012-06-04 DIAGNOSIS — F419 Anxiety disorder, unspecified: Secondary | ICD-10-CM

## 2012-06-04 DIAGNOSIS — F411 Generalized anxiety disorder: Secondary | ICD-10-CM | POA: Insufficient documentation

## 2012-06-04 LAB — CBC
HCT: 36.6 % (ref 36.0–46.0)
MCH: 30.9 pg (ref 26.0–34.0)
MCHC: 32.8 g/dL (ref 30.0–36.0)
WBC: 4.4 10*3/uL (ref 4.0–10.5)

## 2012-06-04 LAB — BASIC METABOLIC PANEL
BUN: 28 mg/dL — ABNORMAL HIGH (ref 6–23)
CO2: 32 mEq/L (ref 19–32)
GFR calc non Af Amer: 34 mL/min — ABNORMAL LOW (ref >90)
Glucose, Bld: 165 mg/dL — ABNORMAL HIGH (ref 70–99)
Potassium: 4.2 mEq/L (ref 3.5–5.1)

## 2012-06-04 LAB — URINE MICROSCOPIC-ADD ON

## 2012-06-04 LAB — URINALYSIS, ROUTINE W REFLEX MICROSCOPIC
Bilirubin Urine: NEGATIVE
Glucose, UA: NEGATIVE mg/dL
Hgb urine dipstick: NEGATIVE
Specific Gravity, Urine: 1.005 (ref 1.005–1.030)
Urobilinogen, UA: 0.2 mg/dL (ref 0.0–1.0)

## 2012-06-04 MED ORDER — OMEPRAZOLE 20 MG PO CPDR: 20.0000 mg | DELAYED_RELEASE_CAPSULE | Freq: Every day | ORAL | Status: DC

## 2012-06-04 MED ORDER — ALPRAZOLAM 0.5 MG PO TABS: 0.5000 mg | ORAL_TABLET | Freq: Three times a day (TID) | ORAL | Status: DC | PRN

## 2012-06-04 NOTE — ED Notes (Signed)
Pt has weird feeling across forehead and has been having this off and on

## 2012-06-04 NOTE — ED Notes (Signed)
MD at bedside. 

## 2012-06-04 NOTE — ED Provider Notes (Signed)
History     CSN: [id number]  Arrival date & time 06/04/12  1012   First MD Initiated Contact with Patient 06/04/12 1131      Chief Complaint  Patient presents with  . Anxiety    (Consider location/radiation/quality/duration/timing/severity/associated sxs/prior treatment) HPI  Past Medical History  Diagnosis Date  . Hypertension   . Thyroid disease   . Anemia   . Thrombocytopenia   . Osteoarthritis     left knee  . Hypothyroidism   . Hypercholesterolemia   . History of breast cancer   . Idiopathic thrombocytopenic purpura     A questionable history of idiopathic thrombocytopenic purpura  . Acute blood loss anemia     secondary to surgery, requiring blood transfusion  . Hypokalemia     treated  . Leukocytosis   . Pyrexia   . Anxiety attack     Past Surgical History  Procedure Date  . Cholecystectomy   . Colon surgery   . Knee reconstruction, medial patellar femoral ligament   . Total knee     No family history on file.  History  Substance Use Topics  . Smoking status: Former Games developer  . Smokeless tobacco: Not on file  . Alcohol Use: No    OB History    Grav Para Term Preterm Abortions TAB SAB Ect Mult Living                  Review of Systems  Allergies  Naproxen  Home Medications   Current Outpatient Rx  Name Route Sig Dispense Refill  . ASPIRIN EC 81 MG PO TBEC Oral Take 81 mg by mouth daily.      Marland Kitchen CALCIUM-VITAMIN D 250-125 MG-UNIT PO TABS Oral Take 1 tablet by mouth daily.      . OMEGA-3 FATTY ACIDS 1000 MG PO CAPS Oral Take 1 g by mouth daily.     Marland Kitchen LEVOTHYROXINE SODIUM 100 MCG PO TABS Oral Take 1 tablet (100 mcg total) by mouth daily. 90 tablet 3  . LISINOPRIL 40 MG PO TABS Oral Take 1 tablet (40 mg total) by mouth daily. 60 tablet 11  . LORATADINE 10 MG PO TABS Oral Take 10 mg by mouth daily.     . MOMETASONE FUROATE 50 MCG/ACT NA SUSP Nasal Place 2 sprays into the nose daily as needed. allergies    . ADULT MULTIVITAMIN W/MINERALS CH  Oral Take 1 tablet by mouth daily.    . TORSEMIDE 20 MG PO TABS Oral Take 10 mg by mouth daily.      BP 190/64  Pulse 72  Temp 98.3 F (36.8 C) (Oral)  Resp 18  SpO2 100%  Physical Exam  ED Course  Procedures (including critical care time)  Labs Reviewed  BASIC METABOLIC PANEL - Abnormal; Notable for the following:    Glucose, Bld 165 (*)     BUN 28 (*)     Creatinine, Ser 1.45 (*)     GFR calc non Af Amer 34 (*)     GFR calc Af Amer 39 (*)     All other components within normal limits  CBC   No results found.   No diagnosis found.  ECG Normal sinus rhythm at 65 beats per minute. Normal axis. Q waves in V1 and V2, consistent with old septal infarct. Normal intervals. Normal.  ST and T-wave  MDM  Anxiety hypertension        Cheri Guppy, MD 06/16/12 2228

## 2012-06-04 NOTE — ED Notes (Signed)
PT states that she has been having some issues with anxiety and was seen for it one week ago in the ed.  Pt states she was thinking about what she needed to do and how she was going to do it and felt like she was going to pass out.  Pt reports weakness all over and states sometimes gets weak in legs

## 2012-06-07 ENCOUNTER — Ambulatory Visit (INDEPENDENT_AMBULATORY_CARE_PROVIDER_SITE_OTHER): Payer: PRIVATE HEALTH INSURANCE | Admitting: Family Medicine

## 2012-06-07 VITALS — BP 162/66 | HR 62 | Temp 97.9°F | Resp 16 | Ht 62.5 in | Wt 134.0 lb

## 2012-06-07 DIAGNOSIS — R739 Hyperglycemia, unspecified: Secondary | ICD-10-CM

## 2012-06-07 DIAGNOSIS — F411 Generalized anxiety disorder: Secondary | ICD-10-CM

## 2012-06-07 DIAGNOSIS — F419 Anxiety disorder, unspecified: Secondary | ICD-10-CM

## 2012-06-07 DIAGNOSIS — Z13220 Encounter for screening for lipoid disorders: Secondary | ICD-10-CM

## 2012-06-07 DIAGNOSIS — R7309 Other abnormal glucose: Secondary | ICD-10-CM

## 2012-06-07 DIAGNOSIS — I10 Essential (primary) hypertension: Secondary | ICD-10-CM

## 2012-06-07 LAB — POCT GLYCOSYLATED HEMOGLOBIN (HGB A1C): Hemoglobin A1C: 5.3

## 2012-06-07 LAB — LIPID PANEL: Cholesterol: 229 mg/dL — ABNORMAL HIGH (ref 0–200)

## 2012-06-07 NOTE — Patient Instructions (Addendum)
You have chronic anxiety and have been prescribed Alprazolam 0.5 mg tablets. I suggest you take 1/2 tablet twice a day.  It is important that you take this medication in order to help reduce the anxiety that you are feeling; it is affecting other aspects of your health. Try Ensure, Boost or Special K protein shakes to improve your nutrition. I have included some information about gastritis. Continue all other medications.   Gastritis, Adult Gastritis is soreness and puffiness (inflammation) of the lining of the stomach. If you do not get help, gastritis can cause bleeding and sores (ulcers) in the stomach. HOME CARE   Only take medicine as told by your doctor.  If you were given antibiotic medicines, take them as told. Finish the medicines even if you start to feel better.  Drink enough fluids to keep your pee (urine) clear or pale yellow.  Avoid foods and drinks that make your problems worse. Foods you may want to avoid include:  Caffeine or alcohol.  Chocolate.  Mint.  Garlic and onions.  Spicy foods.  Citrus fruits, including oranges, lemons, or limes.  Food containing tomatoes, including sauce, chili, salsa, and pizza.  Fried and fatty foods.  Eat small meals throughout the day instead of large meals. GET HELP RIGHT AWAY IF:   You have black or dark red poop (stools).  You throw up (vomit) blood. It may look like coffee grounds.  You cannot keep fluids down.  Your belly (abdominal) pain gets worse.  You have a fever.  You do not feel better after 1 week.  You have any other questions or concerns. MAKE SURE YOU:   Understand these instructions.  Will watch your condition.  Will get help right away if you are not doing well or get worse. Document Released: 02/01/2008 Document Revised: 11/07/2011 Document Reviewed: 09/28/2011 [facility] Patient Information 2013 [place], [place].

## 2012-06-08 NOTE — Progress Notes (Signed)
Quick Note:  Please call pt and advise that the following labs are abnormal...  Lipids values (cholesterol, etc) are about the same as November 2011. Your risk of heart disease is way below average- this is great news ! Work on healthy eating and staying active in a "good" way and reducing stress as we discussed. Take your medications as advised.  Copy to pt. ______

## 2012-06-10 ENCOUNTER — Encounter: Payer: Self-pay | Admitting: Family Medicine

## 2012-06-10 NOTE — Progress Notes (Signed)
S:  This 78 y.o. AA female returns for follow-up of two main issues- HTN and chronic anxiety. She has had several Urgent Care and ED visits in the last 6-8 weeks for evaluation of symptoms diagnosed as "Anxiety". She was prescribed  Alprazolam at last ED visit on 06/04/12. She is hesitant to start the medication because she wants to try to cope with her stressors without medication. Her friend who accompanies her today has tried to reason with her and reassure her that  the medication would benefit her greatly. She is slowly making changes in her personal life to "lighten" her load but it is a slow process. The family member for whom she has been responsible has family who are not very involved; the pt is   attempting to turn over the care of her cousin to her immediate family. She also needs to resolve some legal issues in order to extricate herself from responsibility for her cousin.  Pt wants blood sugar checked and she wants to be assessed for Diabetes (+ family hx). Also wants lipids checked ("They  Haven't been checked in awhile").  We discussed at length the numerous visits to UC and ED for recurrent symptoms; all providers who have evaluated the pt agree that the anxiety issue must be addressed.  ROS: Intermittent chest discomfort with palpitations, mild insomnia, decreased appetite with weight loss, general feeling of nervousness and anxiety. No suicidal ideation or thoughts. No thought of self-harm.  O:  Filed Vitals:   06/07/12 0818  BP: 162/66  Pulse: 62  Temp: 97.9 F (36.6 C)  Resp: 16   GEN: In NAD; WN,WD.   Weight down 9 lbs in 4 months. HEENT: [place]/AT; EOMI , conj/scl clear. Otherwise unremarkable. COR: RRR. No edema. LUNGS: Normal resp rate and effort. NEURO: A&O x 3; CNs intact. Mentation- mildly anxious. Pt is appropriately conversant and attentive.  A1C=5.3%  A/P: 1. Hyperglycemia -isolated finding. Encouraged pt to improve nutrition to avoid continued weight loss.  2.  Anxiety disorder  Strongly encouraged to Alprazolam 0.5 mg Reduce dose to 1/2 tablet twice a day  3. HTN (hypertension)  Continue current medications  4. Screening for hyperlipidemia  Lipid panel

## 2012-07-04 ENCOUNTER — Ambulatory Visit (HOSPITAL_BASED_OUTPATIENT_CLINIC_OR_DEPARTMENT_OTHER): Payer: PRIVATE HEALTH INSURANCE | Admitting: Hematology & Oncology

## 2012-07-04 ENCOUNTER — Other Ambulatory Visit (HOSPITAL_BASED_OUTPATIENT_CLINIC_OR_DEPARTMENT_OTHER): Payer: PRIVATE HEALTH INSURANCE | Admitting: Lab

## 2012-07-04 VITALS — BP 168/57 | HR 61 | Temp 97.9°F | Resp 16 | Ht 62.0 in | Wt 137.0 lb

## 2012-07-04 DIAGNOSIS — C50919 Malignant neoplasm of unspecified site of unspecified female breast: Secondary | ICD-10-CM

## 2012-07-04 DIAGNOSIS — D693 Immune thrombocytopenic purpura: Secondary | ICD-10-CM

## 2012-07-04 DIAGNOSIS — D649 Anemia, unspecified: Secondary | ICD-10-CM

## 2012-07-04 DIAGNOSIS — Z853 Personal history of malignant neoplasm of breast: Secondary | ICD-10-CM

## 2012-07-04 DIAGNOSIS — E039 Hypothyroidism, unspecified: Secondary | ICD-10-CM

## 2012-07-04 DIAGNOSIS — F419 Anxiety disorder, unspecified: Secondary | ICD-10-CM

## 2012-07-04 LAB — CBC WITH DIFFERENTIAL (CANCER CENTER ONLY)
BASO%: 1.5 % (ref 0.0–2.0)
LYMPH%: 31.1 % (ref 14.0–48.0)
MCV: 96 fL (ref 81–101)
MONO#: 0.4 10*3/uL (ref 0.1–0.9)
MONO%: 6.6 % (ref 0.0–13.0)
Platelets: 272 10*3/uL (ref 145–400)
RDW: 12.5 % (ref 11.1–15.7)
WBC: 5.3 10*3/uL (ref 3.9–10.0)

## 2012-07-04 LAB — CHCC SATELLITE - SMEAR

## 2012-07-04 LAB — IRON AND TIBC: UIBC: 281 ug/dL (ref 125–400)

## 2012-07-04 NOTE — Progress Notes (Signed)
This office note has been dictated.

## 2012-07-05 NOTE — Progress Notes (Signed)
CC:   Maurice March, M.D.  DIAGNOSES: 1. 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 2. Immune thrombocytopenia, remission.  CURRENT THERAPY:  Observation.  INTERIM HISTORY:  Ms. Gummo comes in for followup.  She reports she has been to the ER several times.  It sounds like she is having anxiety attacks.  Dr. Audria Nine is following her.  Thankfully, nothing has been found when she does go to the ER.  She just has health issues.  She lives by herself.  She says she does have a good family.  From my point of view, there are no problems with respect to her DCIS or the ITP.  Her last mammogram was done this year, from what she tells me. Unfortunately, there is nothing in the [facility] about this.  She has had no change in bowel or bladder habits.  She is worried about her blood pressure.  She said that her blood pressure fluctuates.  She is on quite a few medications.  She is on Xanax, which she does not like taking, but only takes as indicated.  She does have hypothyroidism.  She is on Synthroid.  I am checking her TSH today.  PHYSICAL EXAMINATION:  General:  This is a somewhat elderly-appearing African American female, in no obvious distress.  Vital Signs: Temperature of 97.9, pulse 61, respiratory rate 16, blood pressure 168/57.  Weight is 137.  Head and neck:  Normocephalic, atraumatic skull.  There is no scleral icterus.  Her pupils react appropriately. There are no intraoral lesions.  There is no adenopathy in the neck. She has no palpable thyroid.  Lungs:  Clear bilaterally.  There are no rales, wheezes, or rhonchi.  Cardiac:  Regular rate and rhythm, with a normal S1 and S2.  There are no murmurs, rubs, or bruits.  Abdomen: Soft, with good bowel sounds.  There is no palpable abdominal mass. There is no palpable hepatosplenomegaly.  Breasts:  Shows right breast with no masses, edema, or erythema.  There is no right axillary adenopathy.  Left  breast shows a well-healed lumpectomy at the 2 o'clock position.  There is some slight firmness at the lumpectomy site.  No distinct masses noted in the left breast.  There is no left axillary adenopathy.  Back:  No tenderness over the spine, ribs, or hips. Extremities:  Show no clubbing, cyanosis, or edema.  Neurological: Shows no focal neurological deficits.  LABORATORY STUDIES:  [place] cell count is 5.3, hemoglobin 11.8, hematocrit 36.1, platelet count 272,000.  IMPRESSION:  Ms. Ron is a very nice 78 year old [place] female with a history of DCIS of the left breast.  Again, this is not a problem for [place].  She had a lumpectomy over 6 years ago.  She had radiation, followed by [facility].  She had chronic ITP.  This basically has been in remission now for about 7 or 8 years.  We will go ahead and plan to get her back in in another year.  I just do not see that we need to see her any more often.  She is being followed by Dr. Audria Nine, who is very diligent and very thorough.    ______________________________ Josph Macho, M.D. PRE/MEDQ  D:  07/04/2012  T:  07/05/2012  Job:  [id number]

## 2012-07-10 ENCOUNTER — Telehealth: Payer: Self-pay | Admitting: *Deleted

## 2012-07-10 ENCOUNTER — Other Ambulatory Visit: Payer: Self-pay | Admitting: *Deleted

## 2012-07-10 ENCOUNTER — Telehealth: Payer: Self-pay | Admitting: Hematology & Oncology

## 2012-07-10 DIAGNOSIS — D649 Anemia, unspecified: Secondary | ICD-10-CM

## 2012-07-10 NOTE — Telephone Encounter (Signed)
Message copied by Anselm Jungling on Tue Jul 10, 2012  2:49 PM ------      Message from: Arlan Organ R      Created: Wed Jul 04, 2012  5:13 PM       Call - her iron is borderline low.  She may feel better with a dose of Feraheme at 510mg  x 1 dose.  PLease set up.  Her thyroid is ok.  Cindee Lame

## 2012-07-10 NOTE — Telephone Encounter (Signed)
Per RN to sch iron apt for patient.  Patient called and sch apt for 07/19/12

## 2012-07-10 NOTE — Telephone Encounter (Signed)
Called patient to let her know that her iron is borderline low.  She may benefit from IV Feraheme.  appt made with our schedulers

## 2012-07-12 ENCOUNTER — Ambulatory Visit: Payer: PRIVATE HEALTH INSURANCE | Admitting: Family Medicine

## 2012-07-12 ENCOUNTER — Encounter: Payer: Self-pay | Admitting: Family Medicine

## 2012-07-12 ENCOUNTER — Ambulatory Visit (INDEPENDENT_AMBULATORY_CARE_PROVIDER_SITE_OTHER): Payer: PRIVATE HEALTH INSURANCE | Admitting: Family Medicine

## 2012-07-12 VITALS — BP 168/62 | HR 69 | Temp 97.2°F | Resp 16 | Ht 62.5 in | Wt 136.0 lb

## 2012-07-12 DIAGNOSIS — F419 Anxiety disorder, unspecified: Secondary | ICD-10-CM

## 2012-07-12 DIAGNOSIS — R1013 Epigastric pain: Secondary | ICD-10-CM

## 2012-07-12 DIAGNOSIS — F411 Generalized anxiety disorder: Secondary | ICD-10-CM

## 2012-07-12 DIAGNOSIS — I10 Essential (primary) hypertension: Secondary | ICD-10-CM

## 2012-07-12 MED ORDER — ALPRAZOLAM 0.5 MG PO TABS: ORAL_TABLET | ORAL | Status: DC

## 2012-07-12 MED ORDER — MOMETASONE FUROATE 50 MCG/ACT NA SUSP: 2.0000 | Freq: Every day | NASAL | Status: DC | PRN

## 2012-07-12 MED ORDER — OMEPRAZOLE 20 MG PO CPDR: 20.0000 mg | DELAYED_RELEASE_CAPSULE | Freq: Every day | ORAL | Status: DC

## 2012-07-12 NOTE — Patient Instructions (Signed)
Indigestion  Indigestion is discomfort in the upper abdomen that is caused by underlying problems such as gastroesophageal reflux disease (GERD), ulcers, or gallbladder problems.   CAUSES   Indigestion can be caused by many things. Possible causes include:   Stomach acid in the esophagus.   Stomach infections, usually caused by the bacteria H. pylori.   Being overweight.   Hiatal hernia. This means part of the stomach pushes up through the diaphragm.   Overeating.   Emotional problems, such as stress, anxiety, or depression.   Poor nutrition.   Consuming too much alcohol, tobacco, or caffeine.   Consuming spicy foods, fats, peppermint, chocolate, tomato products, citrus, or fruit juices.   Medicines such as aspirin and other anti-inflammatory drugs, hormones, steroids, and thyroid medicines.   Gastroparesis. This is a condition in which the stomach does not empty properly.   Stomach cancer.   Pregnancy, due to an increase in hormone levels, a relaxation of muscles in the digestive tract, and pressure on the stomach from the growing fetus.  SYMPTOMS    Uncomfortable feeling of fullness after eating.   Pain or burning sensation in the upper abdomen.   Bloating.   Belching and gas.   Nausea and vomiting.   Acidic taste in the mouth.   Burning sensation in the chest (heartburn).  DIAGNOSIS   Your caregiver will review your medical history and perform a physical exam. Other tests, such as blood tests, stool tests, X-rays, and other imaging scans, may be done to check for more serious problems.  TREATMENT   Liquid antacids and other drugs may be given to block stomach acid secretion. Medicines that increase esophageal muscle tone may also be given to help reduce symptoms. If an infection is found, antibiotic medicine may be given.  HOME CARE INSTRUCTIONS   Avoid foods and drinks that make your symptoms worse, such as:   Caffeine or alcoholic drinks.   Chocolate.   Peppermint or mint  flavorings.   Garlic and onions.   Spicy foods.   Citrus fruits, such as oranges, lemons, or limes.   Tomato-based foods such as sauce, chili, salsa, and pizza.   Fried and fatty foods.   Avoid eating for the 3 hours prior to your bedtime.   Eat small, frequent meals instead of large meals.   Stop smoking if you smoke.   Maintain a healthy weight.   Wear loose-fitting clothing. Do not wear anything tight around your waist that causes pressure on your stomach.   Raise the head of your bed 4 to 8 inches with wood blocks to help you sleep. Extra pillows will not help.   Only take over-the-counter or prescription medicines as directed by your caregiver.   Do not take aspirin, ibuprofen, or other nonsteroidal anti-inflammatory drugs (NSAIDs).  SEEK IMMEDIATE MEDICAL CARE IF:    You are not better after 2 days.   You have chest pressure or pain that radiates up into your neck, arms, back, jaw, or upper abdomen.   You have difficulty swallowing.   You keep vomiting.   You have black or bloody stools.   You have a fever.   You have dizziness, fainting, difficulty breathing, or heavy sweating.   You have severe abdominal pain.   You lose weight without trying.  MAKE SURE YOU:   Understand these instructions.   Will watch your condition.   Will get help right away if you are not doing well or get worse.  Document Released: 09/22/2004 

## 2012-07-12 NOTE — Progress Notes (Signed)
S: Pt returns for follow-up  HTN and anxiety. She is compliant with medications and feels better. Alprazolam is used once a day most days. She checks BP at home: SBP 110- 150, DBP 60-80. 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 Pt plans to visit son and his family in [place] in December; she is feeling a little excited about that. She was treated for mild dyspepsia several weeks ago- now out of 2- week supply of Prilosec. Wants to use "more natural" treatment if possible or switch to Pepcid.  C/O discomfort around L ear; has appt to see dentist about problem tooth.  Has DMV form to be completed.  ROS: Negative for diaphoresis, change of appetite, CP or tightness, palpitations, SOB or DOE, edema, HA, dizziness, numbness, weakness or syncope.  O: Filed Vitals:   07/12/12 1358  BP: 168/62                                     Initial reading: 156/63  Pulse:   Temp:   Resp:    GEN: In NAD; WN,WD. HEENT: [place]/AT; EOMI w/ clear conj/scl. EACs/TMs normal. Dentition- fair. NECK: No LAN/ TMG. COR: RRR. LUNGS: Normal resp rate and effort. NEURO: A&O x 3; CNs intact. Mentation- pt appears calmer and more elaxed. She has a pleasant mood and appropriate affect; is conversant, attentive and focused.  A/P:  1. HTN, goal below 140/80 -stable; cont current medication  2. Anxiety             RF: Alprazolam  3. Dyspepsia       RX: Omeprazole   20 mg  1 tab daily    RX: Zostavax given to pt to take to pharmacy for administration.

## 2012-07-16 ENCOUNTER — Ambulatory Visit: Payer: PRIVATE HEALTH INSURANCE

## 2012-07-18 ENCOUNTER — Ambulatory Visit: Payer: PRIVATE HEALTH INSURANCE | Admitting: Family Medicine

## 2012-07-18 ENCOUNTER — Ambulatory Visit: Payer: PRIVATE HEALTH INSURANCE

## 2012-07-19 ENCOUNTER — Ambulatory Visit (HOSPITAL_BASED_OUTPATIENT_CLINIC_OR_DEPARTMENT_OTHER): Payer: Medicaid Other

## 2012-07-19 VITALS — BP 151/74 | HR 71 | Temp 97.1°F | Resp 20

## 2012-07-19 DIAGNOSIS — D509 Iron deficiency anemia, unspecified: Secondary | ICD-10-CM

## 2012-07-19 DIAGNOSIS — D649 Anemia, unspecified: Secondary | ICD-10-CM

## 2012-07-19 MED ORDER — FERUMOXYTOL INJECTION 510 MG/17 ML
510.0000 mg | Freq: Once | INTRAVENOUS | Status: AC
  Administered 2012-07-19: 510 mg via INTRAVENOUS
  Filled 2012-07-19: qty 17

## 2012-07-19 MED ORDER — SODIUM CHLORIDE 0.9 % IV SOLN
Freq: Once | INTRAVENOUS | Status: AC
  Administered 2012-07-19: 15:00:00 via INTRAVENOUS

## 2012-07-19 NOTE — Patient Instructions (Signed)
Ferumoxytol injection What is this medicine? FERUMOXYTOL is an iron complex. Iron is used to make healthy red blood cells, which carry oxygen and nutrients throughout the body. This medicine is used to treat iron deficiency anemia in people with chronic kidney disease. This medicine may be used for other purposes; ask your health care provider or pharmacist if you have questions. What should I tell my health care provider before I take this medicine? They need to know if you have any of these conditions: -anemia not caused by low iron levels -high levels of iron in the blood -magnetic resonance imaging (MRI) test scheduled -an unusual or allergic reaction to iron, other medicines, foods, dyes, or preservatives -pregnant or trying to get pregnant -breast-feeding How should I use this medicine? This medicine is for infusion into a vein. It is given by a health care professional in a hospital or clinic setting. Talk to your pediatrician regarding the use of this medicine in children. Special care may be needed. Overdosage: If you think you've taken too much of this medicine contact a poison control center or emergency room at once. Overdosage: If you think you have taken too much of this medicine contact a poison control center or emergency room at once. NOTE: This medicine is only for you. Do not share this medicine with others. What if I miss a dose? It is important not to miss your dose. Call your doctor or health care professional if you are unable to keep an appointment. What may interact with this medicine? This medicine may interact with the following medications: -other iron products This list may not describe all possible interactions. Give your health care provider a list of all the medicines, herbs, non-prescription drugs, or dietary supplements you use. Also tell them if you smoke, drink alcohol, or use illegal drugs. Some items may interact with your medicine. What should I watch  for while using this medicine? Visit your doctor or healthcare professional regularly. Tell your doctor or healthcare professional if your symptoms do not start to get better or if they get worse. You may need blood work done while you are taking this medicine. You may need to follow a special diet. Talk to your doctor. Foods that contain iron include: whole grains/cereals, dried fruits, beans, or peas, leafy green vegetables, and organ meats (liver, kidney). What side effects may I notice from receiving this medicine? Side effects that you should report to your doctor or health care professional as soon as possible: -allergic reactions like skin rash, itching or hives, swelling of the face, lips, or tongue -breathing problems -changes in blood pressure -feeling faint or lightheaded, falls -fever or chills -flushing, sweating, or hot feelings -swelling of the ankles or feet Side effects that usually do not require medical attention (Report these to your doctor or health care professional if they continue or are bothersome.): -diarrhea -headache -nausea, vomiting -stomach pain This list may not describe all possible side effects. Call your doctor for medical advice about side effects. You may report side effects to FDA at 1-800-FDA-1088. Where should I keep my medicine? This drug is given in a hospital or clinic and will not be stored at home. NOTE: This sheet is a summary. It may not cover all possible information. If you have questions about this medicine, talk to your doctor, pharmacist, or health care provider.  2012, [employer] Standard. (05/07/2008 9:48:25 PM) 

## 2012-09-29 ENCOUNTER — Other Ambulatory Visit: Payer: Self-pay | Admitting: Family Medicine

## 2012-10-07 ENCOUNTER — Other Ambulatory Visit: Payer: Self-pay | Admitting: Family Medicine

## 2012-11-08 ENCOUNTER — Ambulatory Visit (INDEPENDENT_AMBULATORY_CARE_PROVIDER_SITE_OTHER): Payer: PRIVATE HEALTH INSURANCE | Admitting: Family Medicine

## 2012-11-08 ENCOUNTER — Encounter: Payer: Self-pay | Admitting: Family Medicine

## 2012-11-08 VITALS — BP 177/72 | HR 68 | Temp 96.3°F | Resp 16 | Ht 62.5 in | Wt 132.0 lb

## 2012-11-08 DIAGNOSIS — R634 Abnormal weight loss: Secondary | ICD-10-CM

## 2012-11-08 LAB — COMPREHENSIVE METABOLIC PANEL
ALT: 16 U/L (ref 0–35)
CO2: 33 mEq/L — ABNORMAL HIGH (ref 19–32)
Calcium: 10.2 mg/dL (ref 8.4–10.5)
Chloride: 101 mEq/L (ref 96–112)
Sodium: 140 mEq/L (ref 135–145)
Total Bilirubin: 0.4 mg/dL (ref 0.3–1.2)
Total Protein: 7.1 g/dL (ref 6.0–8.3)

## 2012-11-08 NOTE — Progress Notes (Signed)
Subjective:    Patient ID: Lisa Sawyer, female    DOB: [birth year], 79 y.o.   MRN: [id number]  HPI   This 79 y.o. AA female is well known to me; she has HTN which is under good control  except when she gets anxious. She recently spent 2 months on the [address] ([place] and [place])  visiting family and had a wonderful, relaxing trip. She continues to address the legal issues re:  long term care of her cousin; her family is helping set up long term management since pt's  cousin can not handle fer financial affairs.  Pt has several concerns:  1- Dry mouth and sinus congestion/ dryness. Mild sore throat and throat clearing due to PND. Pt has seasonal allergies; probably  Increased symptoms due to recent travel and climate changes.  2- Gait disturbance- she requests PT for evaluation. Also c/o LBP w/ hip stiffness and pain (worse in morning upon arising). Also has sensation of pressure in perineum; denies saddle anesthesia or  loss of bladder or bowel control (what she describes sounds like tenesmus).  3- Nutrition- pt has lost weight since last  Visit; did not [place] well [place]; family preferred  [place] cuisine so pt subsisted on Ensure and apples. She is interested in meeting w/ a nutrition expert.    Review of Systems  Constitutional: Positive for appetite change, fatigue and unexpected weight change. Negative for fever, chills, diaphoresis and activity change.  Respiratory: Negative for cough, chest tightness and shortness of breath.   Cardiovascular: Negative for chest pain, palpitations and leg swelling.  Gastrointestinal: Negative for constipation, blood in stool, anal bleeding and rectal pain.  Musculoskeletal: Positive for back pain and arthralgias. Negative for myalgias and joint swelling.  Skin: Negative.   Neurological: Negative for dizziness, tremors, syncope, numbness and headaches.  Psychiatric/Behavioral: Negative for confusion, sleep disturbance, dysphoric  mood and decreased concentration. The patient is nervous/anxious.        Objective:   Physical Exam  Nursing note and vitals reviewed. Constitutional: She is oriented to person, place, and time. Vital signs are normal. She appears well-developed and well-nourished. No distress.  HENT:  Head: Normocephalic and atraumatic.  Right Ear: Hearing, external ear and ear canal normal. Tympanic membrane is scarred. Tympanic membrane is not injected and not erythematous. No middle ear effusion.  Left Ear: Hearing, external ear and ear canal normal. Tympanic membrane is scarred. Tympanic membrane is not injected and not erythematous.  No middle ear effusion.  Nose: Mucosal edema present. No rhinorrhea, nasal deformity or septal deviation. Right sinus exhibits no maxillary sinus tenderness and no frontal sinus tenderness. Left sinus exhibits no maxillary sinus tenderness and no frontal sinus tenderness.  Mouth/Throat: Uvula is midline and mucous membranes are normal. No oral lesions. Normal dentition. Posterior oropharyngeal erythema present. No oropharyngeal exudate or posterior oropharyngeal edema.  Eyes: EOM and lids are normal. Pupils are equal, round, and reactive to light. Right conjunctiva is injected. Left conjunctiva is injected. No scleral icterus.  Neck: Normal range of motion. Neck supple. No thyromegaly present.  Cardiovascular: Normal rate, regular rhythm and normal heart sounds.  Exam reveals no gallop and no friction rub.   No murmur heard. Pulmonary/Chest: Effort normal and breath sounds normal. No respiratory distress. She has no rales.  Abdominal: Soft. Bowel sounds are normal. She exhibits no distension and no mass. There is no tenderness. There is no guarding.  Musculoskeletal: She exhibits tenderness. She exhibits no edema.  Lumbar  Spine- tender in SI joints and posterolateral hips; forward flexion < 45 degrees.   Lymphadenopathy:    She has no cervical adenopathy.  Neurological:  She is alert and oriented to person, place, and time. No cranial nerve deficit. She exhibits normal muscle tone. Coordination normal.  Gait is antalgic; pt cannot walk on toes or heels w/o assist. Cannot bear on either leg for > 2-3 seconds.  Skin: Skin is warm and dry. No rash noted. No erythema.  Psychiatric: She has a normal mood and affect. Her behavior is normal. Judgment and thought content normal.    XRAY: Lumbar spine Nov 2011- Spondylosis at  L5- S1 with loss of disc height.      Assessment & Plan:  Gait disturbance - pt has Lumbar DDD (no recent imaging).  Plan: Ambulatory referral to Physical Therapy  HTN (hypertension) - stable on current medications.  Plan: Comprehensive metabolic panel  History of anemia - pt seen by Hematologist annually.  Plan: Amb ref to Medical Nutrition Therapy-MNT  Loss of weight - Plan: Amb ref to Medical Nutrition Therapy-MNT  Face-to- face time with patient= 35-40 minutes.

## 2012-11-09 NOTE — Progress Notes (Signed)
Quick Note:  Please notify pt that results are normal.   Provide pt with copy of labs. ______ 

## 2012-11-10 ENCOUNTER — Encounter: Payer: Self-pay | Admitting: *Deleted

## 2012-11-14 ENCOUNTER — Telehealth: Payer: Self-pay | Admitting: Family Medicine

## 2012-11-14 NOTE — Telephone Encounter (Signed)
Pt advised that PT referral cannot be completed until she contacts [facility]) and updates her provider information. She understands and will do that within next few days.

## 2012-11-19 ENCOUNTER — Encounter: Payer: Self-pay | Admitting: Dietician

## 2012-11-19 ENCOUNTER — Encounter: Payer: PRIVATE HEALTH INSURANCE | Attending: Family Medicine | Admitting: Dietician

## 2012-11-19 VITALS — Wt 130.2 lb

## 2012-11-19 DIAGNOSIS — Z713 Dietary counseling and surveillance: Secondary | ICD-10-CM | POA: Insufficient documentation

## 2012-11-19 DIAGNOSIS — R634 Abnormal weight loss: Secondary | ICD-10-CM | POA: Insufficient documentation

## 2012-11-19 NOTE — Progress Notes (Signed)
Medical Nutrition Therapy:  Appt start time: 0930 end time:  1030.  Assessment:  Primary concerns today: unintentional wt loss.   MEDICATIONS: see list.   DIETARY INTAKE:  Pt claims she is attempted to eat healthier, and has lost 50 lbs in past year. She says she has been mainly eating smaller portions.   Usual eating pattern includes 2 meals and 2-4 snacks per day.   24-hr recall:  B ( AM): oatmeal and almonds, banana. Sometimes an egg. Milk sometimes. Mostly water.  Snk ( AM): fruit L ( PM): sometimes a peanut butter and jelly sandwich, fruit. Doesn't have a true sit down lunch. Snk ( PM): fruit D ( PM): sporadic intake. Sometimes will cook veg, small amount of meat. Snk ( PM): PB and J or crackers, yogurt with cereal Beverages: water, some milk, has started to drink Ensure, sports drinks like Gatorade  Pt claims she often does not feel hungry. Pt claims she eats more fruit than anything else- lots of bananas and apples.  Usual physical activity: pt claims little, mostly just light cleaning and some cooking. Has not been in yard for over a year.   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   Progress Towards Goal(s):  In progress.   Nutritional Diagnosis:  NI-5.7.1 Inadequate protein intake As related to inadequate total energy intake; reduced hunger; stress.  As evidenced by diet recall, pt admittance of minimal choices of high protein foods; pt reports of weakness, malaise, easy fatigue and muscle soreness.    Intervention:  Nutrition counseling provided regarding high protein food incorporation, mild alterations to intake. RD stressed importance of high quality protein intake, especially during wt loss and the aging process. RD advised pt to discontinue any attempts to lose weight, and to incorporate more protein foods, especially meat and dairy for Iron and Calcium content. RD also advised  pt to incorporate plant-based fats, such as nuts, peanut butter, and oils to increase kcal density of foods, prevent further wt loss. Pt concerned about avoiding GERD, and RD advised primary classes of foods to avoid to preventing aggravation of GERD.   RD emphasized the role of physical activity and muscle strengthening activities in improving energy level, preventing muscle weakness, increasing quality of life, improving gait dynamics, and spurring hunger cues. RD provided pt with detailed instructions for some basic muscle strengthening moves to attempt at home.   Handouts given during visit include:  Home Workout  Goals: Ms. Burdin will participate in 150 minutes of physical activity per week. She will also attempt muscle strengthening activities twice per week. Ms. Medero will consider deep breathing and/or yoga to reduce stress. Ms. Fowle will eat a high protein food at least 3 times per day. High protein foods include yogurt, milk, cheese, meats, poultry, fish, beans, nuts, seeds, and soybeans.  Ms. Gockel will eat at least one meat/poultry/fish each day. Goal is 3 oz or about the size of a deck of cards.  Ms. Brokaw will choose at least one dairy serving every day. This would include a single serving cup of yogurt (about 6 oz.), 2 oz. of cheese, or 8 oz. milk. Greek yogurt is preferable. Ms. Mcnamara will drink one Ensure each day. If she can find it, she will choose Ensure Muscle Health.   Foods to avoid with Reflux: Acidic foods- tomato-based sauces and juices, citrus juices, and some spices Alcohol Caffeine  Peppermint Very high fat meals  Monitoring/Evaluation:  Dietary intake, exercise, plant fat, protein, and body weight in 1 month(s).

## 2012-11-22 ENCOUNTER — Encounter: Payer: Self-pay | Admitting: Hematology & Oncology

## 2012-11-27 ENCOUNTER — Telehealth: Payer: Self-pay

## 2012-11-27 ENCOUNTER — Other Ambulatory Visit: Payer: Self-pay | Admitting: Family Medicine

## 2012-11-27 NOTE — Telephone Encounter (Signed)
Who has she changed it to? Called her left message for her to call back to advise.

## 2012-11-27 NOTE — Telephone Encounter (Signed)
Pt is calling to let dr Audria Nine know that she has changed her primary care provider on her medicaid card.   Best number [phone]

## 2012-12-24 ENCOUNTER — Encounter: Payer: Self-pay | Admitting: Dietician

## 2012-12-24 ENCOUNTER — Encounter: Payer: PRIVATE HEALTH INSURANCE | Attending: Family Medicine | Admitting: Dietician

## 2012-12-24 DIAGNOSIS — R634 Abnormal weight loss: Secondary | ICD-10-CM | POA: Insufficient documentation

## 2012-12-24 DIAGNOSIS — Z713 Dietary counseling and surveillance: Secondary | ICD-10-CM | POA: Insufficient documentation

## 2012-12-24 NOTE — Progress Notes (Signed)
A: Pt reports few difficulties following changes to diet, and has gained 2 lbs since 11/19/12. She claims to have gotten some strength back, and has started physical therapy 2 days a week at 45 min each. She has not yet met her goal for PA, but is doing a very good job incorporating more protein foods as discussed at prior appointment.   Recall: B: cold cereal with yogurt, eggs with toast; milk (1%) S: PB crackers or toast with PB; fruit L: often gets lost; usually drinks an Ensure or eats a piece of fruit. Sometimes PB on whole wheat S:same as earlier; sometimes almonds D: frozen veg mix or greens with chix. Likes [place] hot dogs.  S: maybe more almonds  Dx: See prior.   I: Continue plan of care previously established. Reduce consumption of Ensure to only as needed when unable to eat a breakfast or dinner meal. Her goal now is to establish a slower wt gain pattern until she can maintain a wt between 135 and 140 lbs depending on strength and ability to function in activities she enjoys like gardening.  RD answered a number of pt questions regarding Vitamin D and mineral intake. Her physician recommended a vitamin D supplement. RD explained she should buy Vitamin D3 aka cholecalciferol, and take 2000 IUs daily. RD explained the role of the major dietary minerals and their sources.  RD emphasized the role of physical activity and muscle strengthening activities in improving energy level, preventing muscle weakness, increasing quality of life, improving gait dynamics, and spurring hunger cues. RD provided pt with detailed instructions for some basic muscle strengthening moves to attempt at home.  Handouts given during visit include:  Home Workout Goals:  Ms. Mesina will participate in 150 minutes of physical activity per week. She will also attempt muscle strengthening activities twice per week.  Ms. Wanner will consider deep breathing and/or yoga to reduce stress.  Ms. Brickhouse will eat a high  protein food at least 3 times per day. High protein foods include yogurt, milk, cheese, meats, poultry, fish, beans, nuts, seeds, and soybeans.  Ms. Chaudhari will eat at least one meat/poultry/fish each day. Goal is 3 oz or about the size of a deck of cards.  Ms. Hosek will choose at least one dairy serving every day. This would include a single serving cup of yogurt (about 6 oz.), 2 oz. of cheese, or 8 oz. milk. Greek yogurt is preferable.   M/E: Monitor wt, protein intake, strength and energy level. F/U in 4 weeks.

## 2013-01-03 ENCOUNTER — Ambulatory Visit (INDEPENDENT_AMBULATORY_CARE_PROVIDER_SITE_OTHER): Payer: PRIVATE HEALTH INSURANCE | Admitting: Family Medicine

## 2013-01-03 ENCOUNTER — Encounter: Payer: Self-pay | Admitting: Family Medicine

## 2013-01-03 VITALS — BP 138/70 | HR 72 | Temp 99.5°F | Resp 16 | Ht 61.0 in | Wt 129.0 lb

## 2013-01-03 DIAGNOSIS — E039 Hypothyroidism, unspecified: Secondary | ICD-10-CM

## 2013-01-03 DIAGNOSIS — F411 Generalized anxiety disorder: Secondary | ICD-10-CM

## 2013-01-03 DIAGNOSIS — I10 Essential (primary) hypertension: Secondary | ICD-10-CM

## 2013-01-03 MED ORDER — ALPRAZOLAM 0.5 MG PO TABS: ORAL_TABLET | ORAL | Status: DC

## 2013-01-03 MED ORDER — LEVOTHYROXINE SODIUM 100 MCG PO TABS: 100.0000 ug | ORAL_TABLET | Freq: Every day | ORAL | Status: DC

## 2013-01-03 MED ORDER — OMEPRAZOLE 20 MG PO CPDR: DELAYED_RELEASE_CAPSULE | ORAL | Status: DC

## 2013-01-03 NOTE — Progress Notes (Signed)
S:  This 79 y.o. AA female is here for f/u re: HTN. She has a letter from [place] requesting comment on blood pressure. Pt reports no traffic infractions; she drives w/o problems and is concerned about possible loss of driving privileges.  Re: Gait and balance problems- pt is going to PT and finds it helpful. She has increased strength in her core and legs. Exercises are done at home as directed. She does want to relocate to a PT facility closer to home; she will find out how many more sessions she has and discuss this issue w/ therapist.  Re: Anxiety- Pt takes Alprazolam prn, maybe 1-2 times/ week; if she needs to drive somewhere and feels anxious about it, she will take 1/2 tablet in early AM and feel calmer by midday.  Re: Poor appetite and weight loss- She met w/ registered dietitian and has improved nutrition; he offered some advise about stress reduction also. Pt has been reading about hypothyroidism and is concerned about some of the symptoms.  PMHx, Soc Hx and Fam Hx reviewed.  ROS: As per HPI.  O:  Filed Vitals:   01/03/13 1313 01/03/13 1351  BP: 180/82 138/70  Pulse: 72   Temp: 99.5 F (37.5 C)   TempSrc: Oral   Resp: 16   Height: 5\' 1"  (1.549 m)   Weight: 129 lb (58.514 kg)   SpO2: 97%    GEN: In NAD; WN,WD. HENT: [place]/AT. EOMI w/ clear conj/ sclerae. Wears corrective lenses. EACs/nose/oroph clear. COR" RRR. No edema. LUNGS: Normal resp rate and effort. No dyspnea. SKIN; W&D. No erythema. MS: MAEs; normal gait. No c/c/e. NEURO: A&O x 3; CNs intact. Nonfocal.  Psych- mildly anxious but otherwise appropriate.  A/P: HTN, goal below 140/90 - Stable and controlled when pt is calm. Plan: Comprehensive metabolic panel  Unspecified hypothyroidism - Continue current medication.   Plan: TSH, T4, Free  Anxiety state, unspecified- RF: Alprazolam for prn use.

## 2013-01-03 NOTE — Patient Instructions (Signed)
You need to call Dr. Fabio Bering office to schedule a follow-up. I will direct your letter about Hypertension to [facility] (our office will probably FAX it to the office). Continue all current medications as prescribed.

## 2013-01-04 LAB — COMPREHENSIVE METABOLIC PANEL
BUN: 19 mg/dL (ref 6–23)
CO2: 33 mEq/L — ABNORMAL HIGH (ref 19–32)
Calcium: 10.5 mg/dL (ref 8.4–10.5)
Chloride: 100 mEq/L (ref 96–112)
Creat: 1.02 mg/dL (ref 0.50–1.10)
Glucose, Bld: 122 mg/dL — ABNORMAL HIGH (ref 70–99)
Total Bilirubin: 0.4 mg/dL (ref 0.3–1.2)

## 2013-01-04 NOTE — Progress Notes (Signed)
Quick Note:  Please contact pt and advise that the following labs are abnormal... Blood sugar is a little above normal. Thyroid tests are normal. Continue to eat healthy and stay active.   Copy to pt. ______

## 2013-02-04 ENCOUNTER — Encounter: Payer: PRIVATE HEALTH INSURANCE | Attending: Family Medicine | Admitting: Dietician

## 2013-02-04 DIAGNOSIS — Z713 Dietary counseling and surveillance: Secondary | ICD-10-CM | POA: Insufficient documentation

## 2013-02-04 DIAGNOSIS — R634 Abnormal weight loss: Secondary | ICD-10-CM | POA: Insufficient documentation

## 2013-02-04 NOTE — Progress Notes (Signed)
A: Pt reports with wt loss of about 2.5 lbs, now at 129.8 lbs. She c/o of more stress related to care for her cousin. She also claims a good amount of fatigue, with long bouts of sleep on some days.  Pt diet similar to previous, incorporating some varying meat/fish and veg choices. Pt has clearly not been eating as much since previous visit, especially for her snacks.   Pt does report little to no decrease in strength with her wt loss, and has been doing some of her PT from home. She has also been doing some deep breathing to aid in stress management.  Dx: See prior.  I:  Continue plan of care previously established. Return to drinking at least one Ensure each day, and always drink one if not planning on eating full breakfast, lunch, or dinner meal. Incorporating high protein foods was emphasized. RD also encouraged the pt to return to eating larger snack portions.   Goals:  [name] will participate in 150 minutes of physical activity per week. She will also attempt muscle strengthening activities twice per week.  [name] will consider deep breathing and/or yoga to reduce stress.  [name] will eat a high protein food at least 3 times per day. High protein foods include yogurt, milk, cheese, meats, poultry, fish, beans, nuts, seeds, and soybeans.  [name] will eat at least one meat/poultry/fish each day. Goal is 3 oz or about the size of a deck of cards.  [name] will choose at least one dairy serving every day. This would include a single serving cup of yogurt (about 6 oz.), 2 oz. of cheese, or 8 oz. milk. Greek yogurt is preferable.   M/E:  Monitor wt, protein intake, strength and energy level. F/U in 6 weeks.

## 2013-02-08 ENCOUNTER — Ambulatory Visit: Payer: PRIVATE HEALTH INSURANCE | Admitting: Dietician

## 2013-02-28 ENCOUNTER — Telehealth: Payer: Self-pay

## 2013-02-28 NOTE — Telephone Encounter (Signed)
Pt is needing to talk with someone about getting a referral to physical therapy  Best number [phone]

## 2013-02-28 NOTE — Telephone Encounter (Signed)
Patient has medicare will need office visit for referral for PT, in order for Medicare to cover.

## 2013-03-01 NOTE — Telephone Encounter (Signed)
Pt aware she will need to come in.

## 2013-03-07 ENCOUNTER — Other Ambulatory Visit: Payer: Self-pay | Admitting: Family Medicine

## 2013-03-11 NOTE — Telephone Encounter (Signed)
Pt does need a return call because she states [facility] and is wanting to go closer into [place] and we need a new referral   Best number [phone]

## 2013-03-12 NOTE — Telephone Encounter (Signed)
Called her again. She will need office visit. We are unable to order without face to face eval. Left message for her ot call and make appt.

## 2013-03-14 ENCOUNTER — Encounter: Payer: Self-pay | Admitting: Family Medicine

## 2013-03-14 ENCOUNTER — Ambulatory Visit (INDEPENDENT_AMBULATORY_CARE_PROVIDER_SITE_OTHER): Payer: PRIVATE HEALTH INSURANCE | Admitting: Family Medicine

## 2013-03-14 VITALS — BP 210/70 | HR 61 | Temp 98.7°F | Resp 16 | Ht 61.5 in | Wt 129.0 lb

## 2013-03-14 DIAGNOSIS — R269 Unspecified abnormalities of gait and mobility: Secondary | ICD-10-CM

## 2013-03-14 DIAGNOSIS — I679 Cerebrovascular disease, unspecified: Secondary | ICD-10-CM

## 2013-03-14 DIAGNOSIS — F411 Generalized anxiety disorder: Secondary | ICD-10-CM

## 2013-03-14 DIAGNOSIS — R7309 Other abnormal glucose: Secondary | ICD-10-CM

## 2013-03-14 DIAGNOSIS — R739 Hyperglycemia, unspecified: Secondary | ICD-10-CM

## 2013-03-14 LAB — POCT GLYCOSYLATED HEMOGLOBIN (HGB A1C): Hemoglobin A1C: 4.9

## 2013-03-14 MED ORDER — LEVOTHYROXINE SODIUM 100 MCG PO TABS: 100.0000 ug | ORAL_TABLET | Freq: Every day | ORAL | Status: DC

## 2013-03-14 NOTE — Progress Notes (Signed)
S:  This 79 y.o. AA female is scheduled for PCE but did not want to have that exam today. She has labile HTN; BP often elevated upon 1st arriving at medical facilities for office visit. Pt reports compliance w/ medications. She is asymptomatic except for mild frontal HA with anxiety. She is having some mild cognitive problems w/ forgetfulness. Pt inquires if high BP can cause memory problems. Alprazolam is not taken now because of less anxiety; pt reports oversedation also w/ this medication.  Pt has been getting PT at a [facility] facility but requests transfer to [facility] facility; the [place] is anxiety provoking. Pt has had 7-8 sessions and feels that her gait and balance are improve. She is doing home exercises w/ strength band as well. Mild fatigue continues to be a concern. Pt wondering if increasing Levothyroxine dose may help alleviate this.  Pt has ongoing concerns about her living situation as she ages. Her 2 adult sons live distant from [place] (1 in [place] and 1 in [place]). She would like to remain independent as long as possible. She is encouraged to continue to discuss this w/ her family.  PMHx, Soc Hx and Problem List reviewed.  ROS; As per HPI; otherwise unremarkable.   [place] Scale Vitals:   03/14/13 1446  BP: 210/70                           Re-check: 180/72  Pulse: 61  Temp: 98.7 F (37.1 C)  Resp: 16   GEN: In NAD; WN,WD. HENT: /AT; EOMI w/ clear conj/sclerae. EACs/nose/oroph unremarkable. COR: RRR. No edema. LUNGS; Normal resp rate and effort. SKIN: W&D; no rashes or bruising. MS: MAEs; no deformities or joint effusions. No c/c/e. NEURO: A&Ox 3; CNs intact. Motor/sensory intact. Gait normal. Nonfocal. PSYCH: Mild anxiety w/o agitation or lability. Speech pattern and thought content normal. Judgement sound.   Results for orders placed in visit on 03/14/13  POCT GLYCOSYLATED HEMOGLOBIN (HGB A1C)      Result Value Range   Hemoglobin A1C 4.9       A/P: Cerebral microvascular disease- Secondary to HTN. Reviewed results of CT of head/Brain done in August 2013- Microvascular ischemic disease w/o acute intracranial process; partial empty sella   Gait disturbance - Plan: Ambulatory referral to Physical Therapy (transfer referral to [place] facility).  Hyperglycemia - Encourage healthy nutrition and good hydration. Maintain reasonable physical activity.  Plan: POCT glycosylated hemoglobin (Hb A1C)   Meds ordered this encounter  Medications  . levothyroxine (SYNTHROID, LEVOTHROID) 100 MCG tablet    Sig: Take 1 tablet (100 mcg total) by mouth daily.    Dispense:  90 tablet    Refill:  1

## 2013-03-14 NOTE — Patient Instructions (Signed)
Fall Prevention, Elderly Falls are the leading cause of injuries, accidents, and accidental deaths in people over the age of 45. Falling is a real threat to your ability to live on your own. CAUSES   Poor eyesight or poor hearing can make you more likely to fall.   Illnesses and physical conditions can affect your strength and balance.   Poor lighting, throw rugs and pets in your home can make you more likely to trip or slip.   The side effects of some medicines can upset your balance and lead to falling. These include medicines for depression, sleep problems, high blood pressure, diabetes, and heart conditions.  PREVENTION  Be sure your home is as safe as possible. Here are some tips:  Wear shoes with non-skid soles (not house slippers).   Be sure your home and outside area are well lit.   Use night lights throughout your house, including hallways and stairways.   Remove clutter and clean up spills on floors and walkways.   Remove throw rugs or fasten them to the floor with carpet tape. Tack down carpet edges.   Do not place electrical cords across pathways.   Install grab bars in your bathtub, shower, and toilet area. Towel bars should not be used as a grab bar.   Install handrails on both sides of stairways.   Do not climb on stools or stepladders. Get someone else to help with jobs that require climbing.   Do not wax your floors at all, or use a non-skid wax.   Repair uneven or unsafe sidewalks, walkways or stairs.   Keep frequently used items within reach.   Be aware of pets so you do not trip.  Get regular check-ups from your doctor, and take good care of yourself:  Have your eyes checked every year for vision changes, cataracts, glaucoma, and other eye problems. Wear eyeglasses as directed.   Have your hearing checked every 2 years, or anytime you or others think that you cannot hear well. Use hearing aids as directed.   See your caregiver if you have foot pain or  corns. Sore feet can contribute to falls.   Let your caregiver know if a medicine is making you feel dizzy or making you lose your balance.   Use a cane, walker, or wheelchair as directed. Use walker or wheelchair brakes when getting in and out.   When you get up from bed, sit on the side of the bed for 1 to 2 minutes before you stand up. This will give your blood pressure time to adjust, and you will feel less dizzy.   If you need to go to the bathroom often, consider using a bedside commode.  Keep your body in good shape:  Get regular exercise, especially walking.   Do exercises to strengthen the muscles you use for walking and lifting.   Do not smoke.   Minimize use of alcohol.  SEEK IMMEDIATE MEDICAL CARE IF:   You feel dizzy, weak, or unsteady on your feet.   You feel confused.   You fall.  Document Released: 08/15/2005 Document Revised: 08/04/2011 Document Reviewed: 02/09/2007 [facility] Patient Information 2012 [place], [place].    Heat Stress in the Elderly  Elderly people (people aged 33 years and older) are more prone to heat stress than younger people for several reasons:   Elderly people do not adjust as well as young people to sudden changes in temperature.  They are more likely to have a chronic medical  condition that upsets normal body responses to heat.  They are more likely to take prescription medicines that impair the body's ability to regulate its temperature or that inhibit perspiration. HEAT STROKE  Heat stroke is the most serious heat-related illness. It occurs when the body becomes unable to control its temperature. The body temperature rises rapidly. Then the body loses its ability to sweat and is unable to cool down. The body temperature rises to 105 F (40.6 C) or higher within 10 to 15 minutes. Heat stroke can cause death or permanent disability if emergency treatment is not provided. SYMPTOMS  Warning signs vary but may include the following:  An  extremely high body temperature (above 103 F (39.4 C)).  Nausea.  Red, hot, and dry skin (no sweating).  Rapid, strong pulse.  Throbbing headache.  Dizziness. HEAT EXHAUSTION  Heat exhaustion is a milder form of heat-related illness. It can develop after several days of exposure to high temperatures and not enough fluids. SYMPTOMS  Warning signs vary but may include the following:   Heavy sweating. Paleness.  Muscle cramps.  Tiredness. Weakness.  Dizziness.  Headache. Nausea or vomiting.  Fainting.  Skin: may be cool and moist.  Pulse rate: fast and weak.  Breathing: fast and shallow. WHAT YOU CAN DO TO PROTECT YOURSELF  You can follow these prevention tips to protect yourself from heat-related stress:   Drink cool, nonalcoholic, non-caffeinated beverages. If your caregiver generally limits the amount of fluid you drink or has you on water pills, ask how much you should drink when the weather is hot. Avoid extremely cold liquids. They can cause cramps.  Rest.  Take a cool shower, bath, or sponge bath.  If possible, seek an air-conditioned environment. If you do not have air conditioning, visit an air-conditioned shopping mall or public library to cool off.  Financial risk analyst.  If possible, remain indoors in the heat of the day.  Do not engage in strenuous activities. WHAT YOU CAN DO TO HELP PROTECT ELDERLY RELATIVES AND NEIGHBORS  If you have elderly relatives or neighbors, help them protect themselves from heat-related stress.   Visit older adults at risk at least twice a day. Watch them for signs of heat exhaustion or heat stroke.  Take them to air-conditioned locations if they have transportation problems.  Make sure older adults have access to an electric fan whenever possible. WHAT YOU CAN DO FOR SOMEONE WITH HEAT STRESS   If you see any signs of severe heat stress, you may be dealing with a life-threatening emergency. Have someone call for  immediate medical assistance while you begin cooling the affected person. Do the following:  Get the person to a shady area.  Cool the person rapidly, using whatever methods you can. For example, immerse the person in a tub of cool water or place the person in a cool shower. Spray the person with cool water from a garden hose or sponge the person with cool water. If the humidity is low, wrap the person in a cool, wet sheet. Fan him/her quickly.  Monitor body temperature. Continue cooling efforts until the body temperature drops to 101 - 102F (38.3  C - 38.9  C).  If emergency medical personnel are delayed, call the hospital emergency room for further instructions.  Do not give the person alcohol to drink.  Get medical care as soon as possible. Document Released: 08/03/2009 Document Revised: 11/07/2011 Document Reviewed: 08/03/2009 [facility] Patient Information 2014 [place], [place].

## 2013-03-18 ENCOUNTER — Encounter: Payer: PRIVATE HEALTH INSURANCE | Attending: Family Medicine | Admitting: Dietician

## 2013-03-18 ENCOUNTER — Encounter: Payer: Self-pay | Admitting: Dietician

## 2013-03-18 VITALS — Wt 131.8 lb

## 2013-03-18 DIAGNOSIS — R634 Abnormal weight loss: Secondary | ICD-10-CM | POA: Insufficient documentation

## 2013-03-18 DIAGNOSIS — Z713 Dietary counseling and surveillance: Secondary | ICD-10-CM | POA: Insufficient documentation

## 2013-03-18 DIAGNOSIS — I10 Essential (primary) hypertension: Secondary | ICD-10-CM

## 2013-03-18 NOTE — Progress Notes (Signed)
A: Pt reports with regain of 2 lbs since last visit. She is encouraged to see this result, as she was worried about having lost more weight. She continues to have many questions related forgetfulness and fatigue as it pertains to her eating. She has read an article about eating to prevent Alzheimer's disease and wants to know if the ideas highlighted will help with her forgetfulness.  She also c/o some stress and anxiety. She still has not adopted any physical activity outside some rehab exercises from home and semi-regular PT appointments.  She is more regularly drinking her supplement (Ensure), but admits to very inconsistent meal eating pattern, with limited snacking. She claims to have a poor hunger sensation outside of breakfast time, leading to her lapsing on eating meals. She is doing well with eating more protein foods regularly, and eating a greek yogurt daily for protein and Calcium.  I: RD encouraged pt to continue to choose high protein foods regularly, and counseled pt on the importance of eating at regular intervals. RD highlighted that essentially all of the points made in the article provided are good, but that the pt is not diagnosed with Alzheimer's and her forgetfulness is most likely a function of low to borderline low blood glucose from extended periods without eating or eating relatively little when she does eat. Pt seemed to appreciate this explanation, particularly as being better than "just part of aging." RD also reassured the pt that a number of things she c/o forgetting are fairly common across all age groups. RD recommended writing daily plans in a planner to keep with her for reference, and to set a cell phone or wristwatch alarm (if available) to go off within 6 hours of her last meal at the latest to remind her of the need to eat some food or drink an Ensure as a meal replacement. RD also encouraged the pt to pack foods or supplements when planning on being away from home all  day.  To cope with stress, RD taught the pt once again how to go through deep breathing. RD also strenuously encouraged the pt to identify a physical activity she enjoys (not her PT), and partake of this when she feels anxious or stressed to relieve this tension. RD reinforced the need for regular physical activity (150 minutes or more per week) to trigger hunger cues, reduce stress, reduce BP. The pt expressed understanding of the importance of this and stated she will try walking in the park regularly.  M/E: Monitor body weight, physical activity, meal pattern. F/U PRN.

## 2013-03-25 ENCOUNTER — Ambulatory Visit
Payer: PRIVATE HEALTH INSURANCE | Attending: Family Medicine | Admitting: Rehabilitative and Restorative Service Providers"

## 2013-03-25 DIAGNOSIS — R269 Unspecified abnormalities of gait and mobility: Secondary | ICD-10-CM | POA: Insufficient documentation

## 2013-03-25 DIAGNOSIS — IMO0001 Reserved for inherently not codable concepts without codable children: Secondary | ICD-10-CM | POA: Insufficient documentation

## 2013-03-25 DIAGNOSIS — M6281 Muscle weakness (generalized): Secondary | ICD-10-CM | POA: Insufficient documentation

## 2013-03-29 ENCOUNTER — Telehealth: Payer: Self-pay | Admitting: *Deleted

## 2013-03-29 NOTE — Telephone Encounter (Signed)
Faxed signed assessment and plan for treatment back to rehab center, per Dr Audria Nine.

## 2013-03-31 ENCOUNTER — Encounter (HOSPITAL_COMMUNITY): Payer: Self-pay | Admitting: Cardiology

## 2013-03-31 ENCOUNTER — Emergency Department (HOSPITAL_COMMUNITY)
Admission: EM | Admit: 2013-03-31 | Discharge: 2013-03-31 | Disposition: A | Discharge status: Home / Self Care | Payer: PRIVATE HEALTH INSURANCE | Attending: Emergency Medicine | Admitting: Emergency Medicine

## 2013-03-31 DIAGNOSIS — Z79899 Other long term (current) drug therapy: Secondary | ICD-10-CM | POA: Insufficient documentation

## 2013-03-31 DIAGNOSIS — Z8639 Personal history of other endocrine, nutritional and metabolic disease: Secondary | ICD-10-CM | POA: Insufficient documentation

## 2013-03-31 DIAGNOSIS — E079 Disorder of thyroid, unspecified: Secondary | ICD-10-CM | POA: Insufficient documentation

## 2013-03-31 DIAGNOSIS — R209 Unspecified disturbances of skin sensation: Secondary | ICD-10-CM | POA: Insufficient documentation

## 2013-03-31 DIAGNOSIS — E039 Hypothyroidism, unspecified: Secondary | ICD-10-CM | POA: Insufficient documentation

## 2013-03-31 DIAGNOSIS — Z87891 Personal history of nicotine dependence: Secondary | ICD-10-CM | POA: Insufficient documentation

## 2013-03-31 DIAGNOSIS — Z862 Personal history of diseases of the blood and blood-forming organs and certain disorders involving the immune mechanism: Secondary | ICD-10-CM | POA: Insufficient documentation

## 2013-03-31 DIAGNOSIS — M1710 Unilateral primary osteoarthritis, unspecified knee: Secondary | ICD-10-CM | POA: Insufficient documentation

## 2013-03-31 DIAGNOSIS — Z853 Personal history of malignant neoplasm of breast: Secondary | ICD-10-CM | POA: Insufficient documentation

## 2013-03-31 DIAGNOSIS — F411 Generalized anxiety disorder: Secondary | ICD-10-CM | POA: Insufficient documentation

## 2013-03-31 DIAGNOSIS — I10 Essential (primary) hypertension: Secondary | ICD-10-CM | POA: Insufficient documentation

## 2013-03-31 DIAGNOSIS — R202 Paresthesia of skin: Secondary | ICD-10-CM

## 2013-03-31 DIAGNOSIS — IMO0002 Reserved for concepts with insufficient information to code with codable children: Secondary | ICD-10-CM | POA: Insufficient documentation

## 2013-03-31 DIAGNOSIS — Z7982 Long term (current) use of aspirin: Secondary | ICD-10-CM | POA: Insufficient documentation

## 2013-03-31 LAB — BASIC METABOLIC PANEL
BUN: 20 mg/dL (ref 6–23)
Calcium: 10.5 mg/dL (ref 8.4–10.5)
Creatinine, Ser: 0.93 mg/dL (ref 0.50–1.10)
GFR calc non Af Amer: 57 mL/min — ABNORMAL LOW (ref >90)
Glucose, Bld: 162 mg/dL — ABNORMAL HIGH (ref 70–99)
Sodium: 137 mEq/L (ref 135–145)

## 2013-03-31 LAB — CBC
MCH: 31.7 pg (ref 26.0–34.0)
MCHC: 33.5 g/dL (ref 30.0–36.0)
MCV: 94.6 fL (ref 78.0–100.0)
Platelets: 254 10*3/uL (ref 150–400)
RBC: 3.53 MIL/uL — ABNORMAL LOW (ref 3.87–5.11)
RDW: 13.5 % (ref 11.5–15.5)

## 2013-03-31 MED ORDER — ALPRAZOLAM 0.25 MG PO TABS
0.2500 mg | ORAL_TABLET | Freq: Once | ORAL | Status: AC
  Administered 2013-03-31: 0.25 mg via ORAL
  Filled 2013-03-31: qty 1

## 2013-03-31 MED ORDER — AMLODIPINE BESYLATE 5 MG PO TABS
5.0000 mg | ORAL_TABLET | ORAL | Status: AC
  Administered 2013-03-31: 5 mg via ORAL
  Filled 2013-03-31: qty 1

## 2013-03-31 MED ORDER — LISINOPRIL 20 MG PO TABS
20.0000 mg | ORAL_TABLET | Freq: Once | ORAL | Status: AC
  Administered 2013-03-31: 20 mg via ORAL
  Filled 2013-03-31: qty 1

## 2013-03-31 NOTE — ED Notes (Signed)
Pt to department via EMS- pt reports that she started feeling bad this morning when she got up. States she had a tingling sensation in the back of her neck. States that she also has a hx of anxiety and took medication for that. Denies any pain at this time. Bp-210/82 Hr-80

## 2013-03-31 NOTE — ED Provider Notes (Signed)
CSN: [id number]     Arrival date & time 03/31/13  0272 History     First MD Initiated Contact with Patient 03/31/13 [phone]     Chief Complaint  Patient presents with  . Hypertension  . Dizziness   (Consider location/radiation/quality/duration/timing/severity/associated sxs/prior Treatment) Patient is a 79 y.o. female presenting with hypertension. The history is provided by the patient and the EMS personnel.  Hypertension Pertinent negatives include no chest pain, no abdominal pain, no headaches and no shortness of breath.  pt states this morning noticed a 'tingly, warm' sensation posterior neck and became worried that her bp was high, so called ems to bring to ed.  Pt currently has no numbness/tingling/paresthesias. No weakness. Denies any loss of normal function.  No change in vision or speech. No problems w balance or gait. No headache. Compliant w normal meds, no recent change in meds. No fever or chills. Hx anxiety, states has felt similarly in past.      Past Medical History  Diagnosis Date  . Hypertension   . Thyroid disease   . Anemia   . Thrombocytopenia   . Osteoarthritis     left knee  . Hypothyroidism   . Hypercholesterolemia   . History of breast cancer   . Idiopathic thrombocytopenic purpura     A questionable history of idiopathic thrombocytopenic purpura  . Acute blood loss anemia     secondary to surgery, requiring blood transfusion  . Hypokalemia     treated  . Leukocytosis   . Pyrexia   . Anxiety attack    Past Surgical History  Procedure Laterality Date  . Cholecystectomy    . Colon surgery    . Knee reconstruction, medial patellar femoral ligament    . Total knee     Family History  Problem Relation Age of Onset  . Cancer Mother   . Heart disease Mother   . Heart disease Sister    History  Substance Use Topics  . Smoking status: Former Games developer  . Smokeless tobacco: Not on file  . Alcohol Use: No   OB History   Grav Para Term Preterm  Abortions TAB SAB Ect Mult Living                 Review of Systems  Constitutional: Negative for fever and chills.  HENT: Negative for neck pain and neck stiffness.   Eyes: Negative for visual disturbance.  Respiratory: Negative for shortness of breath.   Cardiovascular: Negative for chest pain and palpitations.  Gastrointestinal: Negative for abdominal pain.  Genitourinary: Negative for flank pain.  Musculoskeletal: Negative for back pain.  Skin: Negative for rash.  Neurological: Negative for headaches.  Hematological: Does not bruise/bleed easily.  Psychiatric/Behavioral: Negative for confusion.    Allergies  Naproxen  Home Medications   Current Outpatient Rx  Name  Route  Sig  Dispense  Refill  . ALPRAZolam (XANAX) 0.5 MG tablet      Take 1/2 - 1 tablet by mouth twice a day prn anxiety.   30 tablet   1   . aspirin EC 81 MG tablet   Oral   Take 81 mg by mouth daily.           . calcium-vitamin D (OSCAL) 250-125 MG-UNIT per tablet   Oral   Take 1 tablet by mouth daily.           . fish oil-omega-3 fatty acids 1000 MG capsule   Oral   Take 1 g by  mouth daily.          Marland Kitchen levothyroxine (SYNTHROID, LEVOTHROID) 100 MCG tablet   Oral   Take 1 tablet (100 mcg total) by mouth daily.   90 tablet   1   . lisinopril (PRINIVIL,ZESTRIL) 40 MG tablet      take 1 tablet by mouth once daily   60 tablet   3   . loratadine (CLARITIN) 10 MG tablet      take 1 tablet by mouth once daily   30 tablet   5   . mometasone (NASONEX) 50 MCG/ACT nasal spray   Nasal   Place 2 sprays into the nose daily as needed. allergies   17 g   5   . Multiple Vitamin (MULITIVITAMIN WITH MINERALS) TABS   Oral   Take 1 tablet by mouth daily.         Marland Kitchen omeprazole (PRILOSEC) 20 MG capsule      Take 1 capsule by mouth once daily   30 capsule   5   . torsemide (DEMADEX) 20 MG tablet   Oral   Take 10 mg by mouth daily.          There were no vitals taken for this  visit. Physical Exam  Nursing note and vitals reviewed. Constitutional: She is oriented to person, place, and time. She appears well-developed and well-nourished. No distress.  HENT:  Head: Atraumatic.  Mouth/Throat: Oropharynx is clear and moist.  Eyes: Conjunctivae and EOM are normal. Pupils are equal, round, and reactive to light. No scleral icterus.  Neck: Normal range of motion. Neck supple. No tracheal deviation present. No thyromegaly present.  No bruit  Cardiovascular: Normal rate, regular rhythm, normal heart sounds and intact distal pulses.   Pulmonary/Chest: Effort normal and breath sounds normal. No respiratory distress.  Abdominal: Soft. Normal appearance and bowel sounds are normal. She exhibits no distension. There is no tenderness.  Musculoskeletal: She exhibits no edema and no tenderness.  Neurological: She is alert and oriented to person, place, and time. No cranial nerve deficit.  Motor intact bil.   Skin: Skin is warm and dry. No rash noted.  Psychiatric:  Mildly anxious.     ED Course   Procedures (including critical care time)  Results for orders placed during the hospital encounter of 03/31/13  BASIC METABOLIC PANEL      Result Value Range   Sodium 137  135 - 145 mEq/L   Potassium 4.2  3.5 - 5.1 mEq/L   Chloride 97  96 - 112 mEq/L   CO2 31  19 - 32 mEq/L   Glucose, Bld 162 (*) 70 - 99 mg/dL   BUN 20  6 - 23 mg/dL   Creatinine, Ser 0.45  0.50 - 1.10 mg/dL   Calcium 40.9  8.4 - 81.1 mg/dL   GFR calc non Af Amer 57 (*) >90 mL/min   GFR calc Af Amer 66 (*) >90 mL/min  CBC      Result Value Range   WBC 5.2  4.0 - 10.5 K/uL   RBC 3.53 (*) 3.87 - 5.11 MIL/uL   Hemoglobin 11.2 (*) 12.0 - 15.0 g/dL   HCT 91.4 (*) 78.2 - 95.6 %   MCV 94.6  78.0 - 100.0 fL   MCH 31.7  26.0 - 34.0 pg   MCHC 33.5  30.0 - 36.0 g/dL   RDW 21.3  08.6 - 57.8 %   Platelets 254  150 - 400 K/uL  MDM  Monitor, vitals.  Reviewed nursing notes and prior charts for  additional history.    Date: 03/31/2013  Rate: 57  Rhythm: normal sinus rhythm  QRS Axis: normal  Intervals: normal  ST/T Wave abnormalities: normal  Conduction Disutrbances:none  Narrative Interpretation:   Old EKG Reviewed: changes noted  Recheck bp high.  Pt indicates hasnt yet taken her bp meds today, given a dose of her bp meds in ed.  Recheck bp improved.  No numbness or weakness. Pt asymptomatic.   Pt appears stable for d/c.      Suzi Roots, MD 03/31/13 [phone]

## 2013-04-02 ENCOUNTER — Ambulatory Visit (INDEPENDENT_AMBULATORY_CARE_PROVIDER_SITE_OTHER): Payer: PRIVATE HEALTH INSURANCE | Admitting: Emergency Medicine

## 2013-04-02 ENCOUNTER — Ambulatory Visit: Payer: PRIVATE HEALTH INSURANCE | Attending: Family Medicine | Admitting: Physical Therapy

## 2013-04-02 VITALS — BP 150/68 | HR 62 | Temp 97.9°F | Resp 17 | Ht 62.0 in | Wt 130.0 lb

## 2013-04-02 DIAGNOSIS — M6281 Muscle weakness (generalized): Secondary | ICD-10-CM | POA: Insufficient documentation

## 2013-04-02 DIAGNOSIS — I10 Essential (primary) hypertension: Secondary | ICD-10-CM

## 2013-04-02 DIAGNOSIS — F419 Anxiety disorder, unspecified: Secondary | ICD-10-CM

## 2013-04-02 DIAGNOSIS — IMO0001 Reserved for inherently not codable concepts without codable children: Secondary | ICD-10-CM | POA: Insufficient documentation

## 2013-04-02 DIAGNOSIS — R269 Unspecified abnormalities of gait and mobility: Secondary | ICD-10-CM | POA: Insufficient documentation

## 2013-04-02 MED ORDER — METOPROLOL SUCCINATE ER 25 MG PO TB24: 25.0000 mg | ORAL_TABLET | Freq: Every day | ORAL | Status: DC

## 2013-04-02 NOTE — Patient Instructions (Addendum)

## 2013-04-02 NOTE — Progress Notes (Signed)
[facility] [address], [place] [address] [phone]  Date:  04/02/2013   Name:  Lisa Sawyer   DOB:  12/02/1931   MRN:  [id number]  PCP:  Dow Adolph, MD    Chief Complaint: Follow-up   History of Present Illness:  Lisa Sawyer is a 79 y.o. very pleasant female patient who presents with the following:  History of hypertension with recent ER visit with marked elevation.  Put on xanax then and has improved.  She feels as though she has a dramatic rise in stressful situations.  Has improved with the xanax.  Sleeping ok.  Less anxiety.  No chest pain or tightness.  No peripheral edema or chest pain.  Denies other complaint or health concern today.   Patient Active Problem List   Diagnosis Date Noted  . Edema 02/21/2012  . Anxiety 12/08/2011  . Palpitations 09/06/2011  . Anemia   . Thrombocytopenia   . CARCINOMA, BREAST 02/07/2007  . HYPOTHYROIDISM 02/07/2007  . HYPERTENSION, ESSENTIAL NOS 02/07/2007  . DEGENERATIVE DISC DISEASE 02/07/2007    Past Medical History  Diagnosis Date  . Hypertension   . Thyroid disease   . Anemia   . Thrombocytopenia   . Osteoarthritis     left knee  . Hypothyroidism   . Hypercholesterolemia   . History of breast cancer   . Idiopathic thrombocytopenic purpura     A questionable history of idiopathic thrombocytopenic purpura  . Acute blood loss anemia     secondary to surgery, requiring blood transfusion  . Hypokalemia     treated  . Leukocytosis   . Pyrexia   . Anxiety attack     Past Surgical History  Procedure Laterality Date  . Cholecystectomy    . Colon surgery    . Knee reconstruction, medial patellar femoral ligament    . Total knee      History  Substance Use Topics  . Smoking status: Former Games developer  . Smokeless tobacco: Not on file  . Alcohol Use: No    Family History  Problem Relation Age of Onset  . Cancer Mother   . Heart disease Mother   . Heart disease Sister      Allergies  Allergen Reactions  . Naproxen Other (See Comments)    unknown    Medication list has been reviewed and updated.  Current Outpatient Prescriptions on File Prior to Visit  Medication Sig Dispense Refill  . ALPRAZolam (XANAX) 0.5 MG tablet Take 0.25-0.5 mg by mouth 2 (two) times daily as needed for sleep.      Marland Kitchen aspirin EC 81 MG tablet Take 81 mg by mouth daily.        . calcium-vitamin D (OSCAL) 250-125 MG-UNIT per tablet Take 1 tablet by mouth daily.        . fish oil-omega-3 fatty acids 1000 MG capsule Take 1 g by mouth daily.       Marland Kitchen levothyroxine (SYNTHROID, LEVOTHROID) 100 MCG tablet Take 100 mcg by mouth daily before breakfast.      . lisinopril (PRINIVIL,ZESTRIL) 40 MG tablet Take 40 mg by mouth daily.      Marland Kitchen loratadine (CLARITIN) 10 MG tablet Take 10 mg by mouth daily.      . mometasone (NASONEX) 50 MCG/ACT nasal spray Place 2 sprays into the nose daily as needed (allergies).      . Multiple Vitamin (MULITIVITAMIN WITH MINERALS) TABS Take 1 tablet by mouth daily.      . [  DISCONTINUED] BENADRYL 25 MG tablet Take 1 tablet (25 mg total) by mouth every 6 (six) hours as needed for itching.  20 tablet  0  . [DISCONTINUED] furosemide (LASIX) 20 MG tablet Take 1 tablet (20 mg total) by mouth daily.  90 tablet  3   No current facility-administered medications on file prior to visit.    Review of Systems:  As per HPI, otherwise negative.    Physical Examination: Filed Vitals:   04/02/13 1107  BP: 150/68  Pulse: 62  Temp: 97.9 F (36.6 C)  Resp: 17   Filed Vitals:   04/02/13 1107  Height: 5\' 2"  (1.575 m)  Weight: 130 lb (58.968 kg)   Body mass index is 23.77 kg/(m^2). Ideal Body Weight: Weight in (lb) to have BMI = 25: 136.4  GEN: WDWN, NAD, Non-toxic, A & O x 3 HEENT: Atraumatic, Normocephalic. Neck supple. No masses, No LAD. Ears and Nose: No external deformity. CV: RRR, No M/G/R. No JVD. No thrill. No extra heart sounds. PULM: CTA B, no wheezes,  crackles, rhonchi. No retractions. No resp. distress. No accessory muscle use. ABD: S, NT, ND, +BS. No rebound. No HSM. EXTR: No c/c/e NEURO Normal gait.  PSYCH: Normally interactive. Conversant. Not depressed or anxious appearing.  Calm demeanor.    Assessment and Plan: Hypertension Labile BP Add low dose toprol Follow up in September   Signed,  Phillips Odor, MD

## 2013-04-03 ENCOUNTER — Telehealth: Payer: Self-pay

## 2013-04-03 NOTE — Telephone Encounter (Signed)
WANTS TO KNOW IF SHE SHOULD TAKE HER BP MEDS IN THE MORNING OR @ NIGHT/  [phone]

## 2013-04-03 NOTE — Telephone Encounter (Signed)
In the morning. Called to advise.

## 2013-04-08 ENCOUNTER — Emergency Department (HOSPITAL_COMMUNITY)
Admission: EM | Admit: 2013-04-08 | Discharge: 2013-04-08 | Disposition: A | Discharge status: Home / Self Care | Payer: PRIVATE HEALTH INSURANCE | Attending: Emergency Medicine | Admitting: Emergency Medicine

## 2013-04-08 ENCOUNTER — Encounter (HOSPITAL_COMMUNITY): Payer: Self-pay | Admitting: Emergency Medicine

## 2013-04-08 DIAGNOSIS — Z87891 Personal history of nicotine dependence: Secondary | ICD-10-CM | POA: Insufficient documentation

## 2013-04-08 DIAGNOSIS — Z602 Problems related to living alone: Secondary | ICD-10-CM | POA: Insufficient documentation

## 2013-04-08 DIAGNOSIS — E039 Hypothyroidism, unspecified: Secondary | ICD-10-CM | POA: Insufficient documentation

## 2013-04-08 DIAGNOSIS — IMO0002 Reserved for concepts with insufficient information to code with codable children: Secondary | ICD-10-CM | POA: Insufficient documentation

## 2013-04-08 DIAGNOSIS — E7800 Pure hypercholesterolemia, unspecified: Secondary | ICD-10-CM | POA: Insufficient documentation

## 2013-04-08 DIAGNOSIS — E079 Disorder of thyroid, unspecified: Secondary | ICD-10-CM | POA: Insufficient documentation

## 2013-04-08 DIAGNOSIS — D696 Thrombocytopenia, unspecified: Secondary | ICD-10-CM | POA: Insufficient documentation

## 2013-04-08 DIAGNOSIS — F411 Generalized anxiety disorder: Secondary | ICD-10-CM | POA: Insufficient documentation

## 2013-04-08 DIAGNOSIS — D649 Anemia, unspecified: Secondary | ICD-10-CM | POA: Insufficient documentation

## 2013-04-08 DIAGNOSIS — Z888 Allergy status to other drugs, medicaments and biological substances status: Secondary | ICD-10-CM | POA: Insufficient documentation

## 2013-04-08 DIAGNOSIS — R002 Palpitations: Secondary | ICD-10-CM | POA: Insufficient documentation

## 2013-04-08 DIAGNOSIS — Z79899 Other long term (current) drug therapy: Secondary | ICD-10-CM | POA: Insufficient documentation

## 2013-04-08 DIAGNOSIS — F419 Anxiety disorder, unspecified: Secondary | ICD-10-CM

## 2013-04-08 DIAGNOSIS — R609 Edema, unspecified: Secondary | ICD-10-CM | POA: Insufficient documentation

## 2013-04-08 DIAGNOSIS — Z7982 Long term (current) use of aspirin: Secondary | ICD-10-CM | POA: Insufficient documentation

## 2013-04-08 DIAGNOSIS — I498 Other specified cardiac arrhythmias: Secondary | ICD-10-CM | POA: Insufficient documentation

## 2013-04-08 DIAGNOSIS — I10 Essential (primary) hypertension: Secondary | ICD-10-CM | POA: Insufficient documentation

## 2013-04-08 DIAGNOSIS — Z853 Personal history of malignant neoplasm of breast: Secondary | ICD-10-CM | POA: Insufficient documentation

## 2013-04-08 DIAGNOSIS — M1710 Unilateral primary osteoarthritis, unspecified knee: Secondary | ICD-10-CM | POA: Insufficient documentation

## 2013-04-08 DIAGNOSIS — F410 Panic disorder [episodic paroxysmal anxiety] without agoraphobia: Secondary | ICD-10-CM | POA: Insufficient documentation

## 2013-04-08 LAB — CBC
MCHC: 33 g/dL (ref 30.0–36.0)
Platelets: 228 10*3/uL (ref 150–400)
RDW: 13.3 % (ref 11.5–15.5)
WBC: 4.4 10*3/uL (ref 4.0–10.5)

## 2013-04-08 LAB — BASIC METABOLIC PANEL
Chloride: 102 mEq/L (ref 96–112)
GFR calc Af Amer: 68 mL/min — ABNORMAL LOW (ref >90)
GFR calc non Af Amer: 58 mL/min — ABNORMAL LOW (ref >90)
Potassium: 4 mEq/L (ref 3.5–5.1)
Sodium: 138 mEq/L (ref 135–145)

## 2013-04-08 NOTE — ED Notes (Signed)
Phlebotomy at bedside.

## 2013-04-08 NOTE — ED Notes (Signed)
Pt arrived via [facility].  Per EMS: pt seen here last week for hypertension and discharged with prescription of metoprolol.  Has a hx of anxiety but pt reports being even more anxious since beginning this medication.  Per EMS her thoughts and actions were somewhat "all over the place, leading to one room from the next looking for different things."  Pt is hypertensive now and reports that her pharmacist told her not to take lasinopril and metoprolol together, so the patient only took her metropolol this am.  EMS reports that BP: 207/73, pulse 58 and irregular, 16 respirations, and 100% on room air.  Pt is calm, alert and oriented, appears to be in no acute distress.

## 2013-04-08 NOTE — ED Notes (Signed)
Case manager left pt room

## 2013-04-08 NOTE — ED Notes (Signed)
Case manager with pt 

## 2013-04-08 NOTE — Progress Notes (Signed)
Addendum 04/08/13 1410 Beecher Mcardle RN BSN [phone] In to see patient, pt presents to ED with Anxiety and elevated blood pressure. Pt was seen in the ED last week for the same complaint. PMH Anxiety, Hypertension, Breast Ca. 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  Pt voiced concerns about not being able to continue to provide care for her cousin. Pt lives alone, and states that she is no longer able to cook for herself on a regular bass, due to caring for her cousin, as per patient she often  misses meal. She also stated, that she have loss some  Weight  in the last couple of month.  Discussed the [facility] program and guideline with patient. Pt verbalized she is in  Agreement with the disposition plan.  This patient may benefit from [facility] care management.services.   Placed a phone referral with Anibal Henderson . Explained to patent that someone from [facility] will contact her by phone  within 1 -3 days. Pt verbalizes understanding. Made EDP Coral Ceo PA-C  in agreement with disposition plan.

## 2013-04-08 NOTE — ED Notes (Signed)
PA at bedside.

## 2013-04-08 NOTE — ED Provider Notes (Signed)
CSN: [id number]     Arrival date & time 04/08/13  1221 History     First MD Initiated Contact with Patient 04/08/13 1240     Chief Complaint  Patient presents with  . Anxiety    pt has a hx of anxiety but since beginning metoprolol, has had increased levels of anxiety  . Hypertension    seen here last week for hypertension, started on metoprolol    HPI  Lisa Sawyer is a 79 year old female with a PMH of breast cancer, hypothyroidism, hypokalemia, HTN, anemia, thrombocytopenia, anxiety, and OA who presents to the ED with HTN and anxiety.  Patient states that she has been feeling anxious this morning since her cousin has been in the hospital.  Her cousin was admitted this morning and Lisa Sawyer is the primary caregiver of her.  Lisa Sawyer states that she had a few palpitations this morning, but is asymptomatic currently.  She feels "nervous" and anxious but otherwise has no complaints.  She has xanax for anxiety, but did not take it this morning.  She also is worried about her blood pressure.  She states that she was started on metoprolol 25 mg on 03/31/13 here in the ED.  She states she "does not like the way it makes her feel" and she is worried that her regular medications are causing her to have a "reaction."  When asked to describe she states she feels "deep and more anxious"  She states "I don't like taking pills."  She took her metoprolol this morning but did not take her daily scheduled lisinopril 25 mg.  She otherwise has been feeling well.  She denies any fever, chills, change in appetite/activity, rhinorrhea, congestion, sore throat, cough, chest pain, SOB, abdominal pain, nausea, emesis, diaphoresis, diarrhea, constipation, weakness, numbness, difficulty with speech/ambulation, or leg edema.  She currently lives alone.  She has an appointment with her PCP Dr. Audria Nine on 04/10/14.     Past Medical History  Diagnosis Date  . Hypertension   . Thyroid disease   . Anemia   . Thrombocytopenia    . Osteoarthritis     left knee  . Hypothyroidism   . Hypercholesterolemia   . History of breast cancer   . Idiopathic thrombocytopenic purpura     A questionable history of idiopathic thrombocytopenic purpura  . Acute blood loss anemia     secondary to surgery, requiring blood transfusion  . Hypokalemia     treated  . Leukocytosis   . Pyrexia   . Anxiety attack    Past Surgical History  Procedure Laterality Date  . Cholecystectomy    . Colon surgery    . Knee reconstruction, medial patellar femoral ligament    . Total knee     Family History  Problem Relation Age of Onset  . Cancer Mother   . Heart disease Mother   . Heart disease Sister    History  Substance Use Topics  . Smoking status: Former Games developer  . Smokeless tobacco: Not on file  . Alcohol Use: No   OB History   Grav Para Term Preterm Abortions TAB SAB Ect Mult Living                 Review of Systems  Constitutional: Negative for fever, chills, activity change, appetite change and fatigue.  HENT: Negative for ear pain, congestion, sore throat, rhinorrhea, neck pain and neck stiffness.   Eyes: Negative for visual disturbance.  Respiratory: Negative for cough and shortness of  breath.   Cardiovascular: Positive for palpitations. Negative for chest pain and leg swelling.  Gastrointestinal: Negative for nausea, vomiting, abdominal pain, diarrhea and constipation.  Genitourinary: Negative for dysuria.  Musculoskeletal: Negative for back pain.  Skin: Negative for wound.  Neurological: Negative for dizziness, syncope, weakness, light-headedness, numbness and headaches.  Psychiatric/Behavioral: Negative for confusion. The patient is nervous/anxious.     Allergies  Naproxen  Home Medications   Current Outpatient Rx  Name  Route  Sig  Dispense  Refill  . ALPRAZolam (XANAX) 0.5 MG tablet   Oral   Take 0.25-0.5 mg by mouth 2 (two) times daily as needed for sleep.         Marland Kitchen aspirin EC 81 MG tablet    Oral   Take 81 mg by mouth daily.           . calcium-vitamin D (OSCAL) 250-125 MG-UNIT per tablet   Oral   Take 1 tablet by mouth daily.           . fish oil-omega-3 fatty acids 1000 MG capsule   Oral   Take 1 g by mouth daily.          Marland Kitchen levothyroxine (SYNTHROID, LEVOTHROID) 100 MCG tablet   Oral   Take 100 mcg by mouth daily before breakfast.         . lisinopril (PRINIVIL,ZESTRIL) 40 MG tablet   Oral   Take 40 mg by mouth daily.         Marland Kitchen loratadine (CLARITIN) 10 MG tablet   Oral   Take 10 mg by mouth daily.         . metoprolol succinate (TOPROL-XL) 25 MG 24 hr tablet   Oral   Take 1 tablet (25 mg total) by mouth daily.   30 tablet   1   . mometasone (NASONEX) 50 MCG/ACT nasal spray   Nasal   Place 2 sprays into the nose daily as needed (allergies).         . Multiple Vitamin (MULITIVITAMIN WITH MINERALS) TABS   Oral   Take 1 tablet by mouth daily.           BP 225/60  Pulse 54  Temp(Src) 98.2 F (36.8 C) (Oral)  SpO2 100%  Filed Vitals:   04/08/13 1445 04/08/13 1500 04/08/13 1515 04/08/13 1530  BP: 199/50 182/68 196/57 177/50  Pulse: 44 90 49 46  Temp:      TempSrc:      Resp: 13 13 14 15   SpO2: 100% 96% 100% 100%    Physical Exam  Nursing note and vitals reviewed. Constitutional: She is oriented to person, place, and time. She appears well-developed and well-nourished. No distress.  HENT:  Head: Normocephalic and atraumatic.  Right Ear: External ear normal.  Left Ear: External ear normal.  Nose: Nose normal.  Mouth/Throat: Oropharynx is clear and moist. No oropharyngeal exudate.  Eyes: Conjunctivae are normal. Pupils are equal, round, and reactive to light. Right eye exhibits no discharge. Left eye exhibits no discharge.  Neck: Normal range of motion. Neck supple.  Cardiovascular: Regular rhythm, normal heart sounds and intact distal pulses.  Exam reveals no gallop and no friction rub.   No murmur heard. Bradycardia   Pulmonary/Chest: Effort normal and breath sounds normal. No respiratory distress. She has no wheezes. She has no rales. She exhibits no tenderness.  Abdominal: Soft. Bowel sounds are normal. She exhibits no distension and no mass. There is no tenderness. There is no rebound and  no guarding.  Musculoskeletal: Normal range of motion. She exhibits edema. She exhibits no tenderness.  Patient moving all extremities throughout exam. Trace pitting bilateral leg edema   Neurological: She is alert and oriented to person, place, and time.  Skin: Skin is warm and dry. She is not diaphoretic.     ED Course   Procedures (including critical care time)  Labs Reviewed  BASIC METABOLIC PANEL - Abnormal; Notable for the following:    GFR calc non Af Amer 58 (*)    GFR calc Af Amer 68 (*)    All other components within normal limits  CBC   No results found. No diagnosis found.   Date: 04/08/2013  Rate: 54  Rhythm: sinus bradycardia  QRS Axis: borderline LAD   Intervals: normal  ST/T Wave abnormalities: normal  Conduction Disutrbances:none  Narrative Interpretation:   Old EKG Reviewed: unchanged  Results for orders placed during the hospital encounter of 04/08/13  CBC      Result Value Range   WBC 4.4  4.0 - 10.5 K/uL   RBC 3.98  3.87 - 5.11 MIL/uL   Hemoglobin 12.4  12.0 - 15.0 g/dL   HCT 16.1  09.6 - 04.5 %   MCV 94.5  78.0 - 100.0 fL   MCH 31.2  26.0 - 34.0 pg   MCHC 33.0  30.0 - 36.0 g/dL   RDW 40.9  81.1 - 91.4 %   Platelets 228  150 - 400 K/uL  BASIC METABOLIC PANEL      Result Value Range   Sodium 138  135 - 145 mEq/L   Potassium 4.0  3.5 - 5.1 mEq/L   Chloride 102  96 - 112 mEq/L   CO2 30  19 - 32 mEq/L   Glucose, Bld 87  70 - 99 mg/dL   BUN 17  6 - 23 mg/dL   Creatinine, Ser 7.82  0.50 - 1.10 mg/dL   Calcium 95.6  8.4 - 21.3 mg/dL   GFR calc non Af Amer 58 (*) >90 mL/min   GFR calc Af Amer 68 (*) >90 mL/min    Lisa Sawyer  Lisa Sawyer is a 79 year old female with a PMH of  breast cancer, hypothyroidism, hypokalemia, HTN, anemia, thrombocytopenia, anxiety, and OA who presents to the ED with HTN and anxiety.  CBC, BMP, and EKG ordered to further evaluate previous palpitations.  Patient does not appear anxious at this time and no pharmacological intervention is necessary.     Rechecks  1:30 PM = Patient doing well.  No complaints.  Waiting to have her labs drawn.  Last BP's recorded: 132/63, 153/70, and 174/60.  States she last took her lisinopril last night at 8:00 pm.   2:15 PM = Patient's BP elevated in the 200's systolic again.  Patient given her home dose of lisinopril 40 mg.   2:36 PM = Patient eating a sandwich.  No complaints or concerns.  Patient states "I feel fine."   3:55 PM = Patient states she has "been thinking about things again" and "doesn't know why she worries."  She otherwise is doing well.  BP elevated again at 196/57 but has not improved to 177/50.  4:48 PM = Patient states she is doing well and is ready to go home.  No further questions or concerns.  Calling her sister for a ride.  HR 68.  SBP 186.     4:45 PM = BP 189/54.  Patient asymptomatic.      Etiology  of anxiety likely exacerbated by her family situation at home.  She has a history of anxiety and has xanax at home for relief.  Palpitations possibly due to anxiety.  Her EKG showed sinus bradycardia which is likely due to her metoprolol.  Patient has a history of bradycardia as well (seen on last EKG 03/31/13).  Patient remained asymptomatic throughout her ED visit.   Patient was instructed to follow-up with her PCP at her scheduled appointment in two days on 04/10/13.  She was encouraged to take her anti-hypertensive medications as scheduled.  She was instructed to return to the ED if she has any chest pain, SOB, weakness, slurred speech, severe headache, numbness, or other concerns.  She verbalized understanding.  She remained in no acute distress throughout her ED visit and was asymptomatic.  She  was in agreement with discharge and plan.  She was seen by the case manager regarding the care of her cousin who helped provide her resources.  She is obtaining a ride home.     Final impressions: 1. Hypertension  2. Anxiety    Luiz Iron PA-C   This patient was discussed with Dr. Geoffery Lyons   Jillyn Ledger, PA-C 04/08/13 2016

## 2013-04-09 ENCOUNTER — Ambulatory Visit: Payer: PRIVATE HEALTH INSURANCE | Admitting: Physical Therapy

## 2013-04-10 ENCOUNTER — Ambulatory Visit (INDEPENDENT_AMBULATORY_CARE_PROVIDER_SITE_OTHER): Payer: PRIVATE HEALTH INSURANCE | Admitting: Family Medicine

## 2013-04-10 ENCOUNTER — Encounter: Payer: Self-pay | Admitting: Family Medicine

## 2013-04-10 ENCOUNTER — Telehealth (HOSPITAL_BASED_OUTPATIENT_CLINIC_OR_DEPARTMENT_OTHER): Payer: Self-pay | Admitting: Emergency Medicine

## 2013-04-10 VITALS — BP 191/73 | HR 58 | Temp 98.3°F | Resp 16 | Ht 62.0 in | Wt 130.0 lb

## 2013-04-10 DIAGNOSIS — F418 Other specified anxiety disorders: Secondary | ICD-10-CM

## 2013-04-10 DIAGNOSIS — F341 Dysthymic disorder: Secondary | ICD-10-CM

## 2013-04-10 DIAGNOSIS — I10 Essential (primary) hypertension: Secondary | ICD-10-CM

## 2013-04-10 MED ORDER — LEVOTHYROXINE SODIUM 100 MCG PO TABS: 100.0000 ug | ORAL_TABLET | Freq: Every day | ORAL | Status: DC

## 2013-04-10 MED ORDER — ALPRAZOLAM 0.5 MG PO TABS: ORAL_TABLET | ORAL | Status: DC

## 2013-04-10 MED ORDER — LISINOPRIL 40 MG PO TABS: 40.0000 mg | ORAL_TABLET | Freq: Every day | ORAL | Status: DC

## 2013-04-10 MED ORDER — SERTRALINE HCL 25 MG PO TABS: ORAL_TABLET | ORAL | Status: DC

## 2013-04-10 MED ORDER — ALPRAZOLAM 0.5 MG PO TABS: 0.2500 mg | ORAL_TABLET | Freq: Two times a day (BID) | ORAL | Status: DC | PRN

## 2013-04-10 NOTE — Progress Notes (Signed)
S:  This 79 y.o. AA female has HTN and chronic anxiety; she has been to ED twice in last 2 weeks w/ elevated BP. She was controlled on Lisinopril 40 mg daily and last seen by Dr. Melburn Popper (Cardiology) 1 year ago. He felt much of her BP elevation  was related to anxiety. Pt reports daily feelings of anxiousness but tries to have a daily routine that includes devotional reading from her Bible. Metoprolol was added to medication regimen on 03/31/13; pt reports "not feeling good" and "having a reaction" including GI upset on this medication. She has not been taking Alprazolam for several weeks but thinks she may need to resume this medication. Pt notes trace ankle edema some days of the week. She denies CP or tightness, SOB or DOE, cough, abd pain, HA, weakness, numbness or syncope. The pt has hypothyroidism and is complaint w/ medication; she takes Levothyroxine separate from other medications.  Pt's cousin who has been her responsibility, has been hospitalized; pt is actively seeking assisted living placement. Pt reports this is "work" but soon hopes to have her cousin in a stable safe living environment. She is feeling confident that this is the right decision re: long-term care of cousin. Pt admits lack of enjoyment for things she used to do and lack of motivation. She is willing to try a low dose anti-depressant.  PMHx, Soc Hx and Problem List reviewed. Medications reconciled.  ROS: As per HPI; otherwise noncontributory.  O: Filed Vitals:   04/10/13 1038  BP: 191/73  Pulse: 58  Temp:   Resp:    GEN: In NAD; WN,WD. HENT: [place]/AT; EOMI w/ clear conj/sclerae. Otherwise unremarkable. COR: RRR. No edema. LUNGS: Normal resp rate and effort. SKIN: W&D; no erythema. MS: MAEs; no c/c/e. NEURO: A&O x 3; CNs intact. Nonfocal. PSYCH: Flat affect; appears mildly anxious but otherwise pleasant and attentive. Speech pattern and thought content normal. Judgement sound.  A/P: HTN (hypertension)- Discontinue  Metoprolol per pt preference; continue Lisinopril 40 mg. Pt has appt w/ Dr. Melburn Popper next month.  Depression with anxiety- Continue Alprazolam as needed. Start Sertraline 25 mg 1 tablet daily. Pt is to take this medication every day without fail.  Meds ordered this encounter  Medications  . DISCONTD: ALPRAZolam (XANAX) 0.5 MG tablet    Sig: Take 0.5-1 tablets (0.25-0.5 mg total) by mouth 2 (two) times daily as needed for sleep. For anxiety    Dispense:  30 tablet    Refill:  0  . sertraline (ZOLOFT) 25 MG tablet    Sig: Take 1 tablet every morning for anxiety and depression.    Dispense:  30 tablet    Refill:  5  . ALPRAZolam (XANAX) 0.5 MG tablet    Sig: Take 1/2 tablet twice a day as needed for anxiety or take at bedtime for sleep.    Dispense:  30 tablet    Refill:  0  . lisinopril (PRINIVIL,ZESTRIL) 40 MG tablet    Sig: Take 1 tablet (40 mg total) by mouth daily.    Dispense:  30 tablet    Refill:  5  . levothyroxine (SYNTHROID, LEVOTHROID) 100 MCG tablet    Sig: Take 1 tablet (100 mcg total) by mouth daily before breakfast.    Dispense:  30 tablet    Refill:  5

## 2013-04-10 NOTE — Patient Instructions (Addendum)

## 2013-04-10 NOTE — ED Provider Notes (Signed)
Medical screening examination/treatment/procedure(s) were conducted as a shared visit with non-physician practitioner(s) and myself.  I personally evaluated the patient during the encounter.  Patient with extensive pmh presents with complaints of anxiety that started this morning.  She is having a stressful situation with an ill family member that seems to be contributing to this.  She denies any chest pain or shortness of breath.  No fevers or chills.  On exam, the patient is afebrile and the vitals are stable.  She was hypertensive on arrival.  The heart is regular rate and rhythm and the lungs are clear.  The abdomen is benign and neurologic exam is non-focal.  The patient arrived with complaints of anxiety and was hypertensive upon arrival.  This resolved while in the ED.  The workup does not reveal an emergent cause of her symptoms and I believe this was likely anxiety-related.  Will discharge to home, return prn.  I have reviewed the midlevel's interpretation of the ekg and agree.  Geoffery Lyons, MD 04/10/13 [phone]

## 2013-04-12 ENCOUNTER — Telehealth: Payer: Self-pay

## 2013-04-12 NOTE — Telephone Encounter (Signed)
Patient states she has questions about the Zoloft, she has read the side effects and she is not sure she wants to try this. She wants to know if she can take the Xanax only. I advised her she should be aware the Xanax is habit forming and she should be cautious with this medication. She states she is afraid she will be a zombie.

## 2013-04-12 NOTE — Telephone Encounter (Signed)
Patient wants Dr. Audria Nine to call regarding the medication of sertraline (ZOLOFT) 25 MG tablet    [phone]

## 2013-04-12 NOTE — Telephone Encounter (Signed)
Pt was prescribed Sertraline 25 mg 1 tablet every morning to treat chronic anxiety. She read the side effect profile and was concerned. After a lengthy discussion, pt agrees to try medication. I advised that she takes medication in mid to late morning when she has a chance to sit down and relax for an hour or so. If she experiences dizziness (which would interfere w/ her ability to drive), then she should try the medication in the evening but should also contact the office to discuss side effects. She understands.

## 2013-04-16 ENCOUNTER — Encounter (HOSPITAL_COMMUNITY): Payer: Self-pay | Admitting: *Deleted

## 2013-04-16 ENCOUNTER — Other Ambulatory Visit: Payer: Self-pay | Admitting: Physician Assistant

## 2013-04-16 ENCOUNTER — Emergency Department (HOSPITAL_COMMUNITY)
Admission: EM | Admit: 2013-04-16 | Discharge: 2013-04-17 | Disposition: A | Discharge status: Home / Self Care | Payer: PRIVATE HEALTH INSURANCE | Attending: Emergency Medicine | Admitting: Emergency Medicine

## 2013-04-16 ENCOUNTER — Encounter: Payer: PRIVATE HEALTH INSURANCE | Admitting: Rehabilitative and Restorative Service Providers"

## 2013-04-16 DIAGNOSIS — Z862 Personal history of diseases of the blood and blood-forming organs and certain disorders involving the immune mechanism: Secondary | ICD-10-CM | POA: Insufficient documentation

## 2013-04-16 DIAGNOSIS — IMO0002 Reserved for concepts with insufficient information to code with codable children: Secondary | ICD-10-CM | POA: Insufficient documentation

## 2013-04-16 DIAGNOSIS — Z7982 Long term (current) use of aspirin: Secondary | ICD-10-CM | POA: Insufficient documentation

## 2013-04-16 DIAGNOSIS — E7800 Pure hypercholesterolemia, unspecified: Secondary | ICD-10-CM | POA: Insufficient documentation

## 2013-04-16 DIAGNOSIS — Z79899 Other long term (current) drug therapy: Secondary | ICD-10-CM | POA: Insufficient documentation

## 2013-04-16 DIAGNOSIS — I10 Essential (primary) hypertension: Secondary | ICD-10-CM | POA: Insufficient documentation

## 2013-04-16 DIAGNOSIS — M1710 Unilateral primary osteoarthritis, unspecified knee: Secondary | ICD-10-CM | POA: Insufficient documentation

## 2013-04-16 DIAGNOSIS — Z87891 Personal history of nicotine dependence: Secondary | ICD-10-CM | POA: Insufficient documentation

## 2013-04-16 DIAGNOSIS — E079 Disorder of thyroid, unspecified: Secondary | ICD-10-CM | POA: Insufficient documentation

## 2013-04-16 DIAGNOSIS — E876 Hypokalemia: Secondary | ICD-10-CM | POA: Insufficient documentation

## 2013-04-16 DIAGNOSIS — Z853 Personal history of malignant neoplasm of breast: Secondary | ICD-10-CM | POA: Insufficient documentation

## 2013-04-16 DIAGNOSIS — T43205A Adverse effect of unspecified antidepressants, initial encounter: Secondary | ICD-10-CM | POA: Insufficient documentation

## 2013-04-16 DIAGNOSIS — R0602 Shortness of breath: Secondary | ICD-10-CM | POA: Insufficient documentation

## 2013-04-16 DIAGNOSIS — T50905A Adverse effect of unspecified drugs, medicaments and biological substances, initial encounter: Secondary | ICD-10-CM

## 2013-04-16 DIAGNOSIS — E039 Hypothyroidism, unspecified: Secondary | ICD-10-CM | POA: Insufficient documentation

## 2013-04-16 DIAGNOSIS — T50995A Adverse effect of other drugs, medicaments and biological substances, initial encounter: Secondary | ICD-10-CM | POA: Insufficient documentation

## 2013-04-16 DIAGNOSIS — F410 Panic disorder [episodic paroxysmal anxiety] without agoraphobia: Secondary | ICD-10-CM | POA: Insufficient documentation

## 2013-04-16 LAB — CBC
HCT: 37.5 % (ref 36.0–46.0)
Hemoglobin: 12.5 g/dL (ref 12.0–15.0)
MCHC: 33.3 g/dL (ref 30.0–36.0)
WBC: 6.9 10*3/uL (ref 4.0–10.5)

## 2013-04-16 NOTE — ED Notes (Signed)
Pt states that she has had zoloft, added to her medications, and xanax. States when she takes them they make her hungry, sleepy, and anxious

## 2013-04-17 NOTE — ED Provider Notes (Signed)
CSN: [id number]     Arrival date & time 04/16/13  2023 History     First MD Initiated Contact with Patient 04/16/13 2355     Chief Complaint  Patient presents with  . medication side effects    (Consider location/radiation/quality/duration/timing/severity/associated sxs/prior Treatment) HPI Comments: 79 year old female presenting with chief complaint of medication side effects. She states she was put on Zoloft, metoprolol, and her dose of Xanax was increased within the past 2 weeks. She complains that in the evening time she feels drowsy. She wakes up confused. Today she felt like she had a hard time getting a full breath in. However she denies any other symptoms such as chest pain, abdominal pain, nausea or vomiting, urinary symptoms. Upon direct questioning, her primary reason for presenting to the emergency department is because she does not want to take her Zoloft anymore.   Past Medical History  Diagnosis Date  . Hypertension   . Thyroid disease   . Anemia   . Thrombocytopenia   . Osteoarthritis     left knee  . Hypothyroidism   . Hypercholesterolemia   . History of breast cancer   . Idiopathic thrombocytopenic purpura     A questionable history of idiopathic thrombocytopenic purpura  . Acute blood loss anemia     secondary to surgery, requiring blood transfusion  . Hypokalemia     treated  . Leukocytosis   . Pyrexia   . Anxiety attack    Past Surgical History  Procedure Laterality Date  . Cholecystectomy    . Colon surgery    . Knee reconstruction, medial patellar femoral ligament    . Total knee     Family History  Problem Relation Age of Onset  . Cancer Mother   . Heart disease Mother   . Heart disease Sister    History  Substance Use Topics  . Smoking status: Former Games developer  . Smokeless tobacco: Not on file  . Alcohol Use: No   OB History   Grav Para Term Preterm Abortions TAB SAB Ect Mult Living                 Review of Systems  Constitutional:  Negative for fever.  HENT: Negative for congestion.   Respiratory: Negative for cough and shortness of breath.   Cardiovascular: Negative for chest pain.  Gastrointestinal: Negative for nausea, vomiting, abdominal pain and diarrhea.  All other systems reviewed and are negative.    Allergies  Naprosyn  Home Medications   Current Outpatient Rx  Name  Route  Sig  Dispense  Refill  . ALPRAZolam (XANAX) 0.5 MG tablet      Take 1/2 tablet twice a day as needed for anxiety or take at bedtime for sleep.   30 tablet   0   . aspirin EC 81 MG tablet   Oral   Take 81 mg by mouth at bedtime.          . Calcium Carb-Cholecalciferol (CALCIUM + D3 PO)   Oral   Take 1 tablet by mouth daily.         . fish oil-omega-3 fatty acids 1000 MG capsule   Oral   Take 1 g by mouth daily.          Marland Kitchen levothyroxine (SYNTHROID, LEVOTHROID) 100 MCG tablet   Oral   Take 1 tablet (100 mcg total) by mouth daily before breakfast.   30 tablet   5   . lisinopril (PRINIVIL,ZESTRIL) 40 MG tablet  Oral   Take 1 tablet (40 mg total) by mouth daily.   30 tablet   5   . loratadine (CLARITIN) 10 MG tablet      take 1 tablet by mouth once daily   30 tablet   5   . metoprolol succinate (TOPROL-XL) 25 MG 24 hr tablet   Oral   Take 25 mg by mouth daily.         . Multiple Vitamin (MULITIVITAMIN WITH MINERALS) TABS   Oral   Take 1 tablet by mouth daily.         . sertraline (ZOLOFT) 25 MG tablet      Take 1 tablet every morning for anxiety and depression.   30 tablet   5    BP 206/68  Pulse 68  Temp(Src) 98.2 F (36.8 C) (Oral)  Resp 18  SpO2 96% Physical Exam  Nursing note and vitals reviewed. Constitutional: She is oriented to person, place, and time. She appears well-developed and well-nourished. No distress.  HENT:  Head: Normocephalic and atraumatic.  Mouth/Throat: Oropharynx is clear and moist.  Eyes: Conjunctivae are normal. Pupils are equal, round, and reactive to  light. No scleral icterus.  Neck: Neck supple.  Cardiovascular: Normal rate, regular rhythm, normal heart sounds and intact distal pulses.   No murmur heard. Pulmonary/Chest: Effort normal and breath sounds normal. No stridor. No respiratory distress. She has no wheezes. She has no rales. She exhibits no tenderness.  Abdominal: Soft. Bowel sounds are normal. She exhibits no distension. There is no tenderness.  Musculoskeletal: Normal range of motion.  Neurological: She is alert and oriented to person, place, and time.  Skin: Skin is warm and dry. No rash noted.  Psychiatric: She has a normal mood and affect. Her behavior is normal.    ED Course   Procedures (including critical care time)  Labs Reviewed  CBC   No results found. 1. Medication adverse effect, initial encounter     MDM  Well-appearing 79 year old female presenting with complaints of medication side effects. These have been going on for at least a week. She is worried about talking to her doctor about these, because she does not want to send her doctor. The primary reason for coming to [facility] department was to discuss discontinuing her Zoloft. Her physical exam was notable for hypertension. On my exam her blood pressure is 170/71. She does not have any signs or symptoms of end organ damage.   I think a large component of pt's visit today is prompted by anxiety over potential side effects of her medications.  Her side effects are potentially from increased xanax use.  I advised she reduce usage of this PRN medication.  Otherwise, I have recommended that she discuss discontinuing her medications with her primary doctor so that they can come up with a plan for safe transition of meds.   Candyce Churn, MD 04/18/13 1311

## 2013-04-17 NOTE — ED Notes (Signed)
Pt reports increased sleepiness and fatigue since she was put on Zoloft. Pt states that she has many stressors in her life and anxiety. Pt just concerned that her Zoloft and blood pressure are causing her to be more fatigued and sleepy. Pt alert and oriented x 4, neuro intact.

## 2013-04-18 ENCOUNTER — Telehealth: Payer: Self-pay

## 2013-04-18 NOTE — Telephone Encounter (Signed)
Called and spoke with patient and she states she feels better and did not have to go hospital. She thinks its Zoloft because after taking Zoloft feels anxious and heart beats faster. She states she cant describe the feeling she just feels out of it. She has been to ER twice. She did not take Zoloft today.she woke up with anxiety. She did take Xanax and that did help. She is also having concerns with her medications. The ER doctor suggested for her to talk to primary about cutting back her BP meds. She has lot of anxiety and gets overwhelmed with taking her medications together. She feels like she might be taking to many medications. I explained to her she needs to make appt to see Dr. Audria Nine to discuss all the medication issues, but if she continues to feel like she has or she gets worse call 911 or go to ER.

## 2013-04-18 NOTE — Telephone Encounter (Signed)
Daughter, Jacklynn Lewis states her mother is having heart paps caused by medication..  She has gone to hospital two times and wants to go again.   Please call  [phone]

## 2013-04-22 ENCOUNTER — Emergency Department (HOSPITAL_COMMUNITY)
Admission: EM | Admit: 2013-04-22 | Discharge: 2013-04-22 | Disposition: A | Discharge status: Home / Self Care | Payer: PRIVATE HEALTH INSURANCE | Attending: Emergency Medicine | Admitting: Emergency Medicine

## 2013-04-22 ENCOUNTER — Encounter (HOSPITAL_COMMUNITY): Payer: Self-pay

## 2013-04-22 DIAGNOSIS — I10 Essential (primary) hypertension: Secondary | ICD-10-CM | POA: Insufficient documentation

## 2013-04-22 DIAGNOSIS — I498 Other specified cardiac arrhythmias: Secondary | ICD-10-CM | POA: Insufficient documentation

## 2013-04-22 DIAGNOSIS — D649 Anemia, unspecified: Secondary | ICD-10-CM | POA: Insufficient documentation

## 2013-04-22 DIAGNOSIS — Z87891 Personal history of nicotine dependence: Secondary | ICD-10-CM | POA: Insufficient documentation

## 2013-04-22 DIAGNOSIS — F410 Panic disorder [episodic paroxysmal anxiety] without agoraphobia: Secondary | ICD-10-CM | POA: Insufficient documentation

## 2013-04-22 DIAGNOSIS — R609 Edema, unspecified: Secondary | ICD-10-CM | POA: Insufficient documentation

## 2013-04-22 DIAGNOSIS — F411 Generalized anxiety disorder: Secondary | ICD-10-CM | POA: Insufficient documentation

## 2013-04-22 DIAGNOSIS — E7800 Pure hypercholesterolemia, unspecified: Secondary | ICD-10-CM | POA: Insufficient documentation

## 2013-04-22 DIAGNOSIS — Z7982 Long term (current) use of aspirin: Secondary | ICD-10-CM | POA: Insufficient documentation

## 2013-04-22 DIAGNOSIS — D696 Thrombocytopenia, unspecified: Secondary | ICD-10-CM | POA: Insufficient documentation

## 2013-04-22 DIAGNOSIS — Z853 Personal history of malignant neoplasm of breast: Secondary | ICD-10-CM | POA: Insufficient documentation

## 2013-04-22 DIAGNOSIS — Z79899 Other long term (current) drug therapy: Secondary | ICD-10-CM | POA: Insufficient documentation

## 2013-04-22 DIAGNOSIS — F419 Anxiety disorder, unspecified: Secondary | ICD-10-CM

## 2013-04-22 DIAGNOSIS — N289 Disorder of kidney and ureter, unspecified: Secondary | ICD-10-CM | POA: Insufficient documentation

## 2013-04-22 DIAGNOSIS — M1990 Unspecified osteoarthritis, unspecified site: Secondary | ICD-10-CM | POA: Insufficient documentation

## 2013-04-22 DIAGNOSIS — E039 Hypothyroidism, unspecified: Secondary | ICD-10-CM | POA: Insufficient documentation

## 2013-04-22 DIAGNOSIS — R142 Eructation: Secondary | ICD-10-CM | POA: Insufficient documentation

## 2013-04-22 DIAGNOSIS — R141 Gas pain: Secondary | ICD-10-CM | POA: Insufficient documentation

## 2013-04-22 DIAGNOSIS — Z888 Allergy status to other drugs, medicaments and biological substances status: Secondary | ICD-10-CM | POA: Insufficient documentation

## 2013-04-22 LAB — POCT I-STAT TROPONIN I: Troponin i, poc: 0.02 ng/mL (ref 0.00–0.08)

## 2013-04-22 LAB — POCT I-STAT, CHEM 8
Calcium, Ion: 1.29 mmol/L (ref 1.13–1.30)
Chloride: 98 mEq/L (ref 96–112)
HCT: 41 % (ref 36.0–46.0)

## 2013-04-22 LAB — GLUCOSE, CAPILLARY: Glucose-Capillary: 102 mg/dL — ABNORMAL HIGH (ref 70–99)

## 2013-04-22 MED ORDER — LISINOPRIL 20 MG PO TABS
40.0000 mg | ORAL_TABLET | Freq: Once | ORAL | Status: AC
  Administered 2013-04-22: 40 mg via ORAL
  Filled 2013-04-22: qty 2

## 2013-04-22 MED ORDER — ALPRAZOLAM 0.25 MG PO TABS
0.5000 mg | ORAL_TABLET | Freq: Once | ORAL | Status: AC
  Administered 2013-04-22: 0.5 mg via ORAL
  Filled 2013-04-22: qty 2

## 2013-04-22 NOTE — ED Provider Notes (Signed)
CSN: [id number]     Arrival date & time 04/22/13  0422 History     First MD Initiated Contact with Patient 04/22/13 [phone]     Chief Complaint  Patient presents with  . Dizziness   (Consider location/radiation/quality/duration/timing/severity/associated sxs/prior Treatment) HPI Complains of feeling anxious and belching a lot since yesterday afternoon. No chest pain no shortness of breath no other complaint. No treatment prior to coming here nothing makes symptoms better or worse. Patient started on Zoloft approximately 1 week ago by her primary care physician for depression. No other associated symptoms. Past Medical History  Diagnosis Date  . Hypertension   . Thyroid disease   . Anemia   . Thrombocytopenia   . Osteoarthritis     left knee  . Hypothyroidism   . Hypercholesterolemia   . History of breast cancer   . Idiopathic thrombocytopenic purpura     A questionable history of idiopathic thrombocytopenic purpura  . Acute blood loss anemia     secondary to surgery, requiring blood transfusion  . Hypokalemia     treated  . Leukocytosis   . Pyrexia   . Anxiety attack    Past Surgical History  Procedure Laterality Date  . Cholecystectomy    . Colon surgery    . Knee reconstruction, medial patellar femoral ligament    . Total knee     Family History  Problem Relation Age of Onset  . Cancer Mother   . Heart disease Mother   . Heart disease Sister    History  Substance Use Topics  . Smoking status: Former Games developer  . Smokeless tobacco: Never Used  . Alcohol Use: No   OB History   Grav Para Term Preterm Abortions TAB SAB Ect Mult Living                 Review of Systems  Constitutional: Negative.   HENT: Negative.   Respiratory: Negative.   Cardiovascular: Negative.   Gastrointestinal:       Belching  Musculoskeletal: Negative.   Skin: Negative.   Neurological: Negative.   Psychiatric/Behavioral:       Anxiety    Allergies  Naprosyn  Home Medications    Current Outpatient Rx  Name  Route  Sig  Dispense  Refill  . ALPRAZolam (XANAX) 0.5 MG tablet      Take 1/2 tablet twice a day as needed for anxiety or take at bedtime for sleep.   30 tablet   0   . aspirin EC 81 MG tablet   Oral   Take 81 mg by mouth at bedtime.          . Calcium Carb-Cholecalciferol (CALCIUM + D3 PO)   Oral   Take 1 tablet by mouth daily.         . fish oil-omega-3 fatty acids 1000 MG capsule   Oral   Take 1 g by mouth daily.          Marland Kitchen levothyroxine (SYNTHROID, LEVOTHROID) 100 MCG tablet   Oral   Take 1 tablet (100 mcg total) by mouth daily before breakfast.   30 tablet   5   . lisinopril (PRINIVIL,ZESTRIL) 40 MG tablet   Oral   Take 1 tablet (40 mg total) by mouth daily.   30 tablet   5   . loratadine (CLARITIN) 10 MG tablet      take 1 tablet by mouth once daily   30 tablet   5   . metoprolol succinate (  TOPROL-XL) 25 MG 24 hr tablet   Oral   Take 25 mg by mouth daily.         . Multiple Vitamin (MULITIVITAMIN WITH MINERALS) TABS   Oral   Take 1 tablet by mouth daily.         . sertraline (ZOLOFT) 25 MG tablet      Take 1 tablet every morning for anxiety and depression.   30 tablet   5    BP 225/67  Pulse 60  Temp(Src) 98.4 F (36.9 C) (Oral)  Resp 15  SpO2 99% Physical Exam  Nursing note and vitals reviewed. Constitutional: She appears well-developed and well-nourished.  HENT:  Head: Normocephalic and atraumatic.  Right Ear: External ear normal.  Left Ear: External ear normal.  Dry mucous membranes  Eyes: Conjunctivae are normal. Pupils are equal, round, and reactive to light.  Neck: Neck supple. No tracheal deviation present. No thyromegaly present.  Cardiovascular: Normal rate and regular rhythm.   No murmur heard. Pulmonary/Chest: Effort normal and breath sounds normal.  Abdominal: Soft. Bowel sounds are normal. She exhibits no distension. There is no tenderness.  Musculoskeletal: Normal range of motion.  She exhibits edema. She exhibits no tenderness.  Trace pretibial pitting edema bilaterally  Neurological: She is alert. Coordination normal.  Skin: Skin is warm and dry. No rash noted.  Psychiatric: She has a normal mood and affect.    ED Course   Procedures (including critical care time)  Labs Reviewed - No data to display No results found. No diagnosis found.  Date: 04/22/2013  Rate: 55  Rhythm: sinus bradycardia  QRS Axis: normal  Intervals: normal  ST/T Wave abnormalities: nonspecific T wave changes  Conduction Disutrbances:none  Narrative Interpretation:   Old EKG Reviewed: No significant change from 03/31/2013 interpreted by me 7:20 AM patient is alert ambulates without difficulty. She's been given her morning lisinopril and Xanax Results for orders placed during the hospital encounter of 04/22/13  GLUCOSE, CAPILLARY      Result Value Range   Glucose-Capillary 102 (*) 70 - 99 mg/dL  POCT I-STAT, CHEM 8      Result Value Range   Sodium 139  135 - 145 mEq/L   Potassium 4.2  3.5 - 5.1 mEq/L   Chloride 98  96 - 112 mEq/L   BUN 18  6 - 23 mg/dL   Creatinine, Ser 6.96 (*) 0.50 - 1.10 mg/dL   Glucose, Bld 295 (*) 70 - 99 mg/dL   Calcium, Ion 2.84  1.32 - 1.30 mmol/L   TCO2 34  0 - 100 mmol/L   Hemoglobin 13.9  12.0 - 15.0 g/dL   HCT 44.0  10.2 - 72.5 %  POCT I-STAT TROPONIN I      Result Value Range   Troponin i, poc 0.02  0.00 - 0.08 ng/mL   Comment 3            No results found.  MDM  I feel that patient is suffering from anxiety after lengthy discussion with her. She is concerned that she may be taking to many medications. I've instructed her to keep her scheduled time with Dr. Albin Fischer in 4 days. Her blood pressure should be rechecked at that office visit. Diagnosis #1 anxiety #2 hypertension #3 mild renal insufficiency    Doug Sou, MD 04/22/13 [phone]

## 2013-04-22 NOTE — ED Notes (Signed)
MD at bedside. 

## 2013-04-22 NOTE — ED Notes (Signed)
Heart healthy breakfast tray ordered for patient.  

## 2013-04-22 NOTE — ED Notes (Signed)
Patient from home via [facility] with c/o feelings of lightheadedness, dizziness and difficulty concentrating for the past several days. Patient states "I'm alone and I don't like being alone." Denies CP or SOB. Per [facility], patient AAOx4 and neurologically intact.

## 2013-04-22 NOTE — ED Notes (Signed)
Patient reports dizziness, lightheadedness and difficulty concentrating "for quite a while." states that it comes and goes. Usually associated with anxiety. Pt states that prescribed Xanax usually helps but didn't tonight. Patient AAOx4, speech is coherent, equal grips, neuro intact. Denies headache, CP or SOB.

## 2013-04-22 NOTE — ED Notes (Addendum)
Spoke with pt at length about ways to reduce anxiety.  Dr Ethelda Chick stated to let pt eat breakfast and then discharge her.

## 2013-04-23 ENCOUNTER — Encounter: Payer: PRIVATE HEALTH INSURANCE | Admitting: Rehabilitative and Restorative Service Providers"

## 2013-04-24 ENCOUNTER — Ambulatory Visit: Payer: PRIVATE HEALTH INSURANCE | Admitting: Family Medicine

## 2013-04-26 ENCOUNTER — Ambulatory Visit (INDEPENDENT_AMBULATORY_CARE_PROVIDER_SITE_OTHER): Payer: PRIVATE HEALTH INSURANCE | Admitting: Family Medicine

## 2013-04-26 ENCOUNTER — Encounter: Payer: Self-pay | Admitting: Family Medicine

## 2013-04-26 VITALS — BP 212/80 | HR 68 | Temp 98.1°F | Resp 16 | Ht 61.75 in | Wt 130.0 lb

## 2013-04-26 DIAGNOSIS — F411 Generalized anxiety disorder: Secondary | ICD-10-CM

## 2013-04-26 DIAGNOSIS — Z7189 Other specified counseling: Secondary | ICD-10-CM

## 2013-04-26 DIAGNOSIS — I10 Essential (primary) hypertension: Secondary | ICD-10-CM

## 2013-04-26 NOTE — Patient Instructions (Addendum)
Zoloft (Sertraline) is being discontinued. Take 1/2 tablet daily until this medication is all gone then do not refill it.  Your repeat blood pressure = 170/90.  Continue all other medications.  Take Synthroid in the morning; take this medication alone.  Take Lisinopril later in the morning. Take this medication at the same time of day each morning.  Take your Fish Oil and Vitamin D after a meal. Do not take them with any  Solid pills or capsules.  Take Alprazolam as needed when you feel anxious.  Discuss with your family your concerns about being alone; it is time for you to seriously consider moving near your children so they can help you age you continue to age. They can be involved in your medical care and other care.  You have an appointment with Dr. Melburn Popper on 05/13/13. Contact that office if you plan to travel during that time. You do need to see him about your blood pressure.

## 2013-04-26 NOTE — Progress Notes (Signed)
S:  This 79 y.o. AA female has malignant HTN and chronic anxiety. She was prescribed Sertraline 25 mg daily but fells strange when she takes it so she wants to discontinue this medication. Pt admits that anxiety is a problem; it is affecting her cognition and quality of life. She realizes that she is very anxious about living alone. She gets anxious and confused when trying to place her meds in a pill box. She has questions about when to take medications in relation to meals. She is surprised to hear about how many visits she has had to ED as well as the phone calls. Her friend who talks w/ her almost daily states that she has noticed some changes in cognitive skills; she seems to be more confused and forgetful.  PMHx, Soc Hx and Fam Hx reviewed.  ROS: Mostly anxiety-related symptoms.   O: Filed Vitals:   04/26/13 1125  BP: 212/80                                   Recheck : 170/90 @ 12:30 pm  Pulse: 68  Temp: 98.1 F (36.7 C)  Resp: 16   GEN: In NAD; WN,WD. HENT: [place]/AT; EOMI w/ clear conj and muddy sclerae. EACs/ nose/ oral mucosa normal. NECK: Supple w/o TMG. COR: RRR; no edema. LUNGS: Normal resp rate and effort. SKIN: W&D; intact w/o lesions, erythema or rashes. MS: MAEs; NT w/o gross joint deformities or effusions. No c/c/e. NEURO: A&O x 3; CNs intact. Gait is slightly antalgic. Otherwise unremarkable. PSYCH: Pleasant demeanor but anxious. Normal speech but occasional inattentiveness and tangential thought content. No agitation or lability.   A/P: HTN (hypertension), malignant- Continue current medications for now; pt sch to see Cardiology next month.  Anxiety state, unspecified- Pt does not want to take SSRI. Pt will continue to use Alprazolam prn and wean off Sertraline 25 mg by taking 1/2 tab daily until none left then discontinue. On e of pt's sons is coming to visit this weekend; they will discuss some long term plans re: pt's living situation, medical issues, etc.  Encounter  for medication review and counseling- Reviewed all medications, proper dosing and administration times. Pt has a pill box but her anxiety increases when she attempts to fill it.

## 2013-05-03 ENCOUNTER — Telehealth: Payer: Self-pay | Admitting: Radiology

## 2013-05-03 ENCOUNTER — Other Ambulatory Visit: Payer: Self-pay | Admitting: Family Medicine

## 2013-05-03 NOTE — Telephone Encounter (Signed)
I agree with action plan; I will call patient later today.

## 2013-05-03 NOTE — Telephone Encounter (Signed)
Alprazolam refill phoned to pt's pharmacy. 

## 2013-05-03 NOTE — Telephone Encounter (Signed)
Patient is having a hard time breathing with some chest pain. She has taken her Alprazolam, feels like she can not get in a deep breath, even after her dose of Aprazolam. I have advised her son (who is calling from [place]) patient needs to be evaluated in the ER now for this. Since he is in [place], I have called 911 and asked for transport to the hospital for her. I called son back and advised, he indicates she states she is feeling better. I advised further, I have called EMS and the paramedics should check her out, if they feel like she is okay and her BP and respirations are stable, they will let her know if she does not need to be transported. Her son will advise her EMS will come check her out and decide if she needs transport. To you FYI

## 2013-05-06 ENCOUNTER — Telehealth: Payer: Self-pay | Admitting: Family Medicine

## 2013-05-06 NOTE — Telephone Encounter (Signed)
Patient called and wanted to know why someone called her i told her that we was just checking to see how patient is doing patient feels much better the EMS did come to her house but she refused to go stating that after she took her medicine she felt better but she also states that someone supposed to come to her house to fill her pill box up spoke with doctor Audria Nine states that she will call patient and doesn't know what patient is talking about that we never sent anyone to her house i told patient that the doctor will call her tomorrow to talk to her

## 2013-05-06 NOTE — Telephone Encounter (Signed)
I called to check on pt today- no answer. Pt had episode last week of CP w/ SOB; symptoms resolved after pt took Alprazolam. EMS was contacted by our office to go check on pt and son in [place] was advised. (See phone note - Amy Littrell 05/03/13). There is no note indicating that pt was transported to ED. I will attempt to contact pt tomorrow.

## 2013-05-07 ENCOUNTER — Other Ambulatory Visit: Payer: Self-pay | Admitting: Family Medicine

## 2013-05-07 NOTE — Telephone Encounter (Signed)
I spoke with pt who is stable; she is taking medications and her sons are monitoring her via telephone. I reminded her of next appt on Jun 10, 2013. She will contact us if she needs to be seen sooner.

## 2013-05-09 ENCOUNTER — Encounter: Payer: PRIVATE HEALTH INSURANCE | Admitting: Family Medicine

## 2013-05-13 ENCOUNTER — Ambulatory Visit (INDEPENDENT_AMBULATORY_CARE_PROVIDER_SITE_OTHER): Payer: PRIVATE HEALTH INSURANCE | Admitting: Cardiovascular Disease

## 2013-05-13 ENCOUNTER — Encounter: Payer: Self-pay | Admitting: Cardiovascular Disease

## 2013-05-13 VITALS — BP 145/76 | HR 71 | Ht 62.5 in | Wt 135.0 lb

## 2013-05-13 DIAGNOSIS — I10 Essential (primary) hypertension: Secondary | ICD-10-CM

## 2013-05-13 MED ORDER — NEBIVOLOL HCL 2.5 MG PO TABS: 2.5000 mg | ORAL_TABLET | Freq: Every day | ORAL | Status: DC

## 2013-05-13 NOTE — Assessment & Plan Note (Signed)
Lisa Sawyer continues to be very anxious.  She clearly gets worked up which causes her BP to increase. When I took her BP very carefully and let the cuff down slowly, her BP was much lower that the initial reading.  She has a significant amount of adipose tissue under her arms which is likely affecting the measurement of her BP.     Will add Bystolic 2.5 mg a day.  She did not tolerate metoprolol but I hope that she will tolerate this.

## 2013-05-13 NOTE — Patient Instructions (Addendum)
Your physician wants you to follow-up in: 6 months You will receive a reminder letter in the mail two months in advance. If you don't receive a letter, please call our office to schedule the follow-up appointment.   Your physician has recommended you make the following change in your medication:  Start bystolic 2.5 mg daily Take blood pressure at least weekly, call with any questions or concerns

## 2013-05-13 NOTE — Progress Notes (Signed)
Lisa Sawyer Date of Birth  [birth year] [facility]     [place] [facility]  [address]    [address]   [address] [place], [place]  [address]    [place], [place]  [address] [phone]  Fax  [phone]  [phone]  Fax [phone]   Problems: 1. Hypertension 2. Palpitations 3. Hypothyroidism  History of Present Illness:  She complains of lots of fatigue.  Palpitations are better. She is under lots of stress with family issues. She's taking care of multiple family members who have not been able to take care of themselves.  Her BP remains elevated.  Sept. 15, 2014:  It has been a year since I last saw her.  She has been taking care of various handicapped  family members.    She was tried on metoprolol but she was not able to tolerate it ( unusual feeling in her stomach).    Current Outpatient Prescriptions on File Prior to Visit  Medication Sig Dispense Refill  . ALPRAZolam (XANAX) 0.5 MG tablet take 1/2-1 tablet by mouth twice a day if needed for anxiety  30 tablet  1  . aspirin EC 81 MG tablet Take 81 mg by mouth at bedtime.       . Calcium Carb-Cholecalciferol (CALCIUM + D3 PO) Take 1 tablet by mouth daily.      . fish oil-omega-3 fatty acids 1000 MG capsule Take 1 g by mouth daily.       Marland Kitchen levothyroxine (SYNTHROID, LEVOTHROID) 100 MCG tablet Take 1 tablet (100 mcg total) by mouth daily before breakfast.  30 tablet  5  . lisinopril (PRINIVIL,ZESTRIL) 40 MG tablet Take 1 tablet (40 mg total) by mouth daily.  30 tablet  5  . loratadine (CLARITIN) 10 MG tablet take 1 tablet by mouth once daily  30 tablet  5  . Multiple Vitamin (MULITIVITAMIN WITH MINERALS) TABS Take 1 tablet by mouth daily.      Marland Kitchen NASONEX 50 MCG/ACT nasal spray instill 2 sprays into each nostril once daily  17 g  12  . [DISCONTINUED] BENADRYL 25 MG tablet Take 1 tablet (25 mg total) by mouth every 6 (six) hours as needed for itching.  20 tablet  0  . [DISCONTINUED] furosemide (LASIX) 20  MG tablet Take 1 tablet (20 mg total) by mouth daily.  90 tablet  3   No current facility-administered medications on file prior to visit.    Allergies  Allergen Reactions  . Naprosyn [Naproxen] Itching  . Zoloft [Sertraline Hcl]     Confusion, blurred vision    Past Medical History  Diagnosis Date  . Hypertension   . Thyroid disease   . Anemia   . Thrombocytopenia   . Osteoarthritis     left knee  . Hypothyroidism   . Hypercholesterolemia   . History of breast cancer   . Idiopathic thrombocytopenic purpura     A questionable history of idiopathic thrombocytopenic purpura  . Acute blood loss anemia     secondary to surgery, requiring blood transfusion  . Hypokalemia     treated  . Leukocytosis   . Pyrexia   . Anxiety attack     Past Surgical History  Procedure Laterality Date  . Cholecystectomy    . Colon surgery    . Knee reconstruction, medial patellar femoral ligament    . Total knee      History  Smoking status  . Former Smoker  Smokeless tobacco  .  Never Used    History  Alcohol Use No    Family History  Problem Relation Age of Onset  . Cancer Mother   . Heart disease Mother   . Heart disease Sister     Reviw of Systems:  Reviewed in the HPI.  All other systems are negative.  Physical Exam: Blood pressure 145/76, pulse 71, height 5' 2.5" (1.588 m), weight 135 lb (61.236 kg). General: Well developed, well nourished, in no acute distress.  Head: Normocephalic, atraumatic, sclera non-icteric, mucus membranes are moist,   Neck: Supple. Carotids are 2 + without bruits. No JVD  Lungs: Clear bilaterally to auscultation.  Heart: regular rate.  normal  S1 S2. No murmurs, gallops or rubs.  Abdomen: Soft, non-tender, non-distended with normal bowel sounds. No hepatomegaly. No rebound/guarding. No masses.  Msk:  Strength and tone are normal  Extremities: No clubbing or cyanosis. No edema.  Distal pedal pulses are 2+ and equal  bilaterally.  Neuro: Alert and oriented X 3. Moves all extremities spontaneously.  Psych:  Responds to questions appropriately with a normal affect.  ECG:  Assessment / Plan:

## 2013-05-16 ENCOUNTER — Inpatient Hospital Stay (HOSPITAL_COMMUNITY)
Admission: EM | Admit: 2013-05-16 | Discharge: 2013-05-20 | DRG: 077 | Disposition: A | Discharge status: Home Health | Payer: PRIVATE HEALTH INSURANCE | Attending: Internal Medicine | Admitting: Internal Medicine

## 2013-05-16 ENCOUNTER — Telehealth: Payer: Self-pay

## 2013-05-16 ENCOUNTER — Emergency Department (HOSPITAL_COMMUNITY): Payer: PRIVATE HEALTH INSURANCE

## 2013-05-16 ENCOUNTER — Encounter (HOSPITAL_COMMUNITY): Payer: Self-pay | Admitting: *Deleted

## 2013-05-16 DIAGNOSIS — N390 Urinary tract infection, site not specified: Secondary | ICD-10-CM | POA: Diagnosis present

## 2013-05-16 DIAGNOSIS — Z23 Encounter for immunization: Secondary | ICD-10-CM

## 2013-05-16 DIAGNOSIS — G92 Toxic encephalopathy: Secondary | ICD-10-CM | POA: Diagnosis present

## 2013-05-16 DIAGNOSIS — D693 Immune thrombocytopenic purpura: Secondary | ICD-10-CM | POA: Diagnosis present

## 2013-05-16 DIAGNOSIS — A498 Other bacterial infections of unspecified site: Secondary | ICD-10-CM | POA: Diagnosis present

## 2013-05-16 DIAGNOSIS — G929 Unspecified toxic encephalopathy: Secondary | ICD-10-CM | POA: Diagnosis present

## 2013-05-16 DIAGNOSIS — Z853 Personal history of malignant neoplasm of breast: Secondary | ICD-10-CM

## 2013-05-16 DIAGNOSIS — E871 Hypo-osmolality and hyponatremia: Secondary | ICD-10-CM | POA: Diagnosis present

## 2013-05-16 DIAGNOSIS — R002 Palpitations: Secondary | ICD-10-CM

## 2013-05-16 DIAGNOSIS — E869 Volume depletion, unspecified: Secondary | ICD-10-CM | POA: Diagnosis present

## 2013-05-16 DIAGNOSIS — R609 Edema, unspecified: Secondary | ICD-10-CM

## 2013-05-16 DIAGNOSIS — E7800 Pure hypercholesterolemia, unspecified: Secondary | ICD-10-CM | POA: Diagnosis present

## 2013-05-16 DIAGNOSIS — K219 Gastro-esophageal reflux disease without esophagitis: Secondary | ICD-10-CM | POA: Diagnosis present

## 2013-05-16 DIAGNOSIS — C50919 Malignant neoplasm of unspecified site of unspecified female breast: Secondary | ICD-10-CM

## 2013-05-16 DIAGNOSIS — R519 Headache, unspecified: Secondary | ICD-10-CM | POA: Diagnosis present

## 2013-05-16 DIAGNOSIS — IMO0002 Reserved for concepts with insufficient information to code with codable children: Secondary | ICD-10-CM

## 2013-05-16 DIAGNOSIS — D696 Thrombocytopenia, unspecified: Secondary | ICD-10-CM

## 2013-05-16 DIAGNOSIS — F418 Other specified anxiety disorders: Secondary | ICD-10-CM | POA: Diagnosis present

## 2013-05-16 DIAGNOSIS — D649 Anemia, unspecified: Secondary | ICD-10-CM

## 2013-05-16 DIAGNOSIS — Z87891 Personal history of nicotine dependence: Secondary | ICD-10-CM

## 2013-05-16 DIAGNOSIS — K3189 Other diseases of stomach and duodenum: Secondary | ICD-10-CM | POA: Diagnosis present

## 2013-05-16 DIAGNOSIS — F341 Dysthymic disorder: Secondary | ICD-10-CM | POA: Diagnosis present

## 2013-05-16 DIAGNOSIS — I10 Essential (primary) hypertension: Secondary | ICD-10-CM | POA: Diagnosis present

## 2013-05-16 DIAGNOSIS — E039 Hypothyroidism, unspecified: Secondary | ICD-10-CM | POA: Diagnosis present

## 2013-05-16 DIAGNOSIS — Z79899 Other long term (current) drug therapy: Secondary | ICD-10-CM

## 2013-05-16 DIAGNOSIS — I674 Hypertensive encephalopathy: Principal | ICD-10-CM | POA: Diagnosis present

## 2013-05-16 HISTORY — DX: Malignant (primary) neoplasm, unspecified: C80.1

## 2013-05-16 LAB — BASIC METABOLIC PANEL
BUN: 24 mg/dL — ABNORMAL HIGH (ref 6–23)
Calcium: 10 mg/dL (ref 8.4–10.5)
Creatinine, Ser: 0.83 mg/dL (ref 0.50–1.10)
GFR calc Af Amer: 76 mL/min — ABNORMAL LOW (ref >90)
GFR calc non Af Amer: 65 mL/min — ABNORMAL LOW (ref >90)
Potassium: 3.9 mEq/L (ref 3.5–5.1)

## 2013-05-16 LAB — CBC
HCT: 36 % (ref 36.0–46.0)
Hemoglobin: 11.8 g/dL — ABNORMAL LOW (ref 12.0–15.0)
MCV: 94.5 fL (ref 78.0–100.0)
RBC: 3.81 MIL/uL — ABNORMAL LOW (ref 3.87–5.11)
WBC: 5.5 10*3/uL (ref 4.0–10.5)

## 2013-05-16 LAB — GLUCOSE, CAPILLARY

## 2013-05-16 MED ORDER — ENOXAPARIN SODIUM 40 MG/0.4ML ~~LOC~~ SOLN
40.0000 mg | SUBCUTANEOUS | Status: DC
  Administered 2013-05-16 – 2013-05-20 (×4): 40 mg via SUBCUTANEOUS
  Filled 2013-05-16 (×5): qty 0.4

## 2013-05-16 MED ORDER — LORATADINE 10 MG PO TABS
10.0000 mg | ORAL_TABLET | Freq: Every day | ORAL | Status: DC
  Administered 2013-05-16 – 2013-05-20 (×4): 10 mg via ORAL
  Filled 2013-05-16 (×6): qty 1

## 2013-05-16 MED ORDER — HYDROMORPHONE HCL PF 1 MG/ML IJ SOLN: 0.5000 mg | INTRAMUSCULAR | Status: DC | PRN

## 2013-05-16 MED ORDER — SODIUM CHLORIDE 0.9 % IJ SOLN
3.0000 mL | Freq: Two times a day (BID) | INTRAMUSCULAR | Status: DC
  Administered 2013-05-16 – 2013-05-17 (×2): 3 mL via INTRAVENOUS

## 2013-05-16 MED ORDER — NICARDIPINE HCL IN NACL 20-0.86 MG/200ML-% IV SOLN
5.0000 mg/h | INTRAVENOUS | Status: DC
  Administered 2013-05-16: 5 mg/h via INTRAVENOUS
  Administered 2013-05-17: 2.5 mg/h via INTRAVENOUS
  Filled 2013-05-16 (×3): qty 200

## 2013-05-16 MED ORDER — LABETALOL HCL 5 MG/ML IV SOLN
20.0000 mg | Freq: Once | INTRAVENOUS | Status: AC
  Administered 2013-05-16: 20 mg via INTRAVENOUS
  Filled 2013-05-16: qty 4

## 2013-05-16 MED ORDER — LEVOTHYROXINE SODIUM 100 MCG PO TABS
100.0000 ug | ORAL_TABLET | Freq: Every day | ORAL | Status: DC
  Administered 2013-05-17 – 2013-05-20 (×4): 100 ug via ORAL
  Filled 2013-05-16 (×5): qty 1

## 2013-05-16 MED ORDER — ONDANSETRON HCL 4 MG/2ML IJ SOLN: 4.0000 mg | Freq: Three times a day (TID) | INTRAMUSCULAR | Status: DC | PRN

## 2013-05-16 MED ORDER — ASPIRIN EC 81 MG PO TBEC
81.0000 mg | DELAYED_RELEASE_TABLET | Freq: Every day | ORAL | Status: DC
  Administered 2013-05-16 – 2013-05-20 (×4): 81 mg via ORAL
  Filled 2013-05-16 (×5): qty 1

## 2013-05-16 MED ORDER — SODIUM CHLORIDE 0.9 % IV SOLN
INTRAVENOUS | Status: DC
  Administered 2013-05-16 – 2013-05-18 (×4): via INTRAVENOUS

## 2013-05-16 MED ORDER — ACETAMINOPHEN 325 MG PO TABS
650.0000 mg | ORAL_TABLET | Freq: Four times a day (QID) | ORAL | Status: DC | PRN
  Administered 2013-05-16: 650 mg via ORAL
  Filled 2013-05-16: qty 2

## 2013-05-16 MED ORDER — SERTRALINE HCL 25 MG PO TABS
12.5000 mg | ORAL_TABLET | Freq: Every day | ORAL | Status: DC
  Administered 2013-05-16 – 2013-05-20 (×4): 12.5 mg via ORAL
  Filled 2013-05-16 (×6): qty 0.5

## 2013-05-16 MED ORDER — ONDANSETRON HCL 4 MG PO TABS
4.0000 mg | ORAL_TABLET | Freq: Four times a day (QID) | ORAL | Status: DC | PRN
  Administered 2013-05-17: 4 mg via ORAL
  Filled 2013-05-16: qty 1

## 2013-05-16 MED ORDER — ADULT MULTIVITAMIN W/MINERALS CH
1.0000 | ORAL_TABLET | Freq: Every day | ORAL | Status: DC
  Administered 2013-05-17 – 2013-05-20 (×4): 1 via ORAL
  Filled 2013-05-16 (×5): qty 1

## 2013-05-16 MED ORDER — LORATADINE 10 MG PO TABS: 10.0000 mg | ORAL_TABLET | Freq: Every day | ORAL | Status: DC

## 2013-05-16 MED ORDER — LABETALOL HCL 5 MG/ML IV SOLN
40.0000 mg | Freq: Once | INTRAVENOUS | Status: AC
  Administered 2013-05-16: 40 mg via INTRAVENOUS
  Filled 2013-05-16: qty 8

## 2013-05-16 MED ORDER — ALPRAZOLAM 0.25 MG PO TABS
0.2500 mg | ORAL_TABLET | Freq: Two times a day (BID) | ORAL | Status: DC | PRN
  Administered 2013-05-18 – 2013-05-20 (×4): 0.25 mg via ORAL
  Filled 2013-05-16: qty 2
  Filled 2013-05-16 (×4): qty 1

## 2013-05-16 MED ORDER — INFLUENZA VAC SPLIT QUAD 0.5 ML IM SUSP: 0.5000 mL | INTRAMUSCULAR | Status: DC

## 2013-05-16 MED ORDER — LISINOPRIL 40 MG PO TABS
40.0000 mg | ORAL_TABLET | Freq: Every day | ORAL | Status: DC
  Administered 2013-05-17 – 2013-05-20 (×4): 40 mg via ORAL
  Filled 2013-05-16 (×4): qty 1

## 2013-05-16 MED ORDER — ONDANSETRON HCL 4 MG/2ML IJ SOLN: 4.0000 mg | Freq: Four times a day (QID) | INTRAMUSCULAR | Status: DC | PRN

## 2013-05-16 MED ORDER — LABETALOL HCL 5 MG/ML IV SOLN: 20.0000 mg | Freq: Once | INTRAVENOUS | Status: DC

## 2013-05-16 MED ORDER — CALCIUM CARBONATE-VITAMIN D 500-200 MG-UNIT PO TABS
1.0000 | ORAL_TABLET | Freq: Every day | ORAL | Status: DC
  Administered 2013-05-16 – 2013-05-20 (×5): 1 via ORAL
  Filled 2013-05-16 (×5): qty 1

## 2013-05-16 MED ORDER — NEBIVOLOL HCL 2.5 MG PO TABS
2.5000 mg | ORAL_TABLET | Freq: Every day | ORAL | Status: DC
  Administered 2013-05-17: 2.5 mg via ORAL
  Filled 2013-05-16: qty 1

## 2013-05-16 MED ORDER — FLUTICASONE PROPIONATE 50 MCG/ACT NA SUSP
1.0000 | Freq: Every day | NASAL | Status: DC
  Administered 2013-05-17 – 2013-05-20 (×4): 1 via NASAL
  Filled 2013-05-16: qty 16

## 2013-05-16 MED ORDER — ACETAMINOPHEN 650 MG RE SUPP: 650.0000 mg | Freq: Four times a day (QID) | RECTAL | Status: DC | PRN

## 2013-05-16 MED ORDER — FENTANYL CITRATE 0.05 MG/ML IJ SOLN
50.0000 ug | Freq: Once | INTRAMUSCULAR | Status: AC
  Administered 2013-05-16: 50 ug via INTRAVENOUS
  Filled 2013-05-16: qty 2

## 2013-05-16 MED ORDER — SERTRALINE HCL 25 MG PO TABS: 25.0000 mg | ORAL_TABLET | Freq: Every day | ORAL | Status: DC

## 2013-05-16 MED ORDER — HYDRALAZINE HCL 20 MG/ML IJ SOLN: 10.0000 mg | Freq: Once | INTRAMUSCULAR | Status: DC

## 2013-05-16 MED ORDER — VITAMIN D3 25 MCG (1000 UNIT) PO TABS
1000.0000 [IU] | ORAL_TABLET | Freq: Every day | ORAL | Status: DC
  Administered 2013-05-16 – 2013-05-20 (×5): 1000 [IU] via ORAL
  Filled 2013-05-16 (×5): qty 1

## 2013-05-16 NOTE — ED Notes (Signed)
Report given to Esmond Harps, RN patient to be transferred to room 1235.

## 2013-05-16 NOTE — Progress Notes (Signed)
Utilization Review completed.  Tkeyah Burkman RN CM  

## 2013-05-16 NOTE — Telephone Encounter (Signed)
Son is asking if the Lisinopril and Bystolic should be taken together, or separately, please advise.

## 2013-05-16 NOTE — H&P (Signed)
[facility] History and Physical  KITIARA HINTZE OZH:[id number] DOB: October 07, 1931 DOA: 05/16/2013  Referring physician: Dr. Jodi Mourning PCP: Dow Adolph, MD  Specialists:   Chief Complaint: headache and some confusion  HPI: Lisa Sawyer is a 79 y.o. female history of hypertension, hypothyroidism, chronic anemia who presents with above complaints. She states that this morning when she woke up she had a frontal headache-felt like a pressure. She also reports that she felt confused as well-not able to find things that she usually would have no trouble locating and so she decided to come to the ED. She denies chest pain, leg swelling, PND and no orthopnea. She reports initially she felt a little short of breath but after she calmed down she did not have any further shortness of breath. She was seen in the ED and blood pressure noted to be markedly elevated-initially 247/67 and seemed to improve after a total of 60 of IV labetalol, but on recheck was up in the 200s again. Patient states that she had seen Dr. Elease Hashimoto recently and Bystolic was prescribed but she had not started taking it yet. Head CT, troponin done in ED were negative also chest x-ray with no acute infiltrates, and EKG with mild bradycardia at 58 and no acute ischemic changes, multiple PACs. She denies dizziness and no focal weakness. She is admitted for further evaluation and management.    Review of Systems: The patient denies anorexia, fever, weight loss,, vision loss, decreased hearing, hoarseness, chest pain, syncope, dyspnea on exertion, peripheral edema, balance deficits, hemoptysis, abdominal pain, melena, hematochezia, severe indigestion/heartburn, hematuria, incontinence, suspicious skin lesions, transient blindness, depression, unusual weight change, abnormal bleeding, enlarged lymph nodes.    Past Medical History  Diagnosis Date  . Hypertension   . Thyroid disease   . Anemia   . Thrombocytopenia   . Osteoarthritis      left knee  . Hypothyroidism   . Hypercholesterolemia   . History of breast cancer   . Idiopathic thrombocytopenic purpura     A questionable history of idiopathic thrombocytopenic purpura  . Acute blood loss anemia     secondary to surgery, requiring blood transfusion  . Hypokalemia     treated  . Leukocytosis   . Pyrexia   . Anxiety attack   . breast ca dx'd 2009    pt states pre ca; lumpectomy-lt w/ xrt   Past Surgical History  Procedure Laterality Date  . Cholecystectomy    . Colon surgery    . Knee reconstruction, medial patellar femoral ligament    . Total knee     Social History:  reports that she has quit smoking. She has never used smokeless tobacco. She reports that she does not drink alcohol or use illicit drugs.  where does patient live--home   Allergies  Allergen Reactions  . Naprosyn [Naproxen] Itching  . Zoloft [Sertraline Hcl]     Confusion, blurred vision    Family History  Problem Relation Age of Onset  . Cancer Mother   . Heart disease Mother   . Heart disease Sister     Prior to Admission medications   Medication Sig Start Date End Date Taking? Authorizing Provider  ALPRAZolam Prudy Feeler) 0.5 MG tablet Take 0.25-0.5 mg by mouth 2 (two) times daily as needed for anxiety.   Yes Historical Provider, MD  aspirin EC 81 MG tablet Take 81 mg by mouth at bedtime.    Yes Historical Provider, MD  calcium-vitamin D (OSCAL WITH D) 500-200 MG-UNIT per tablet  Take 1 tablet by mouth daily.   Yes Historical Provider, MD  cholecalciferol (VITAMIN D) 1000 UNITS tablet Take 1,000 Units by mouth daily.   Yes Historical Provider, MD  fish oil-omega-3 fatty acids 1000 MG capsule Take 1 g by mouth daily.    Yes Historical Provider, MD  levothyroxine (SYNTHROID, LEVOTHROID) 100 MCG tablet Take 1 tablet (100 mcg total) by mouth daily before breakfast. 04/10/13  Yes Maurice March, MD  lisinopril (PRINIVIL,ZESTRIL) 40 MG tablet Take 1 tablet (40 mg total) by mouth daily.  04/10/13  Yes Maurice March, MD  loratadine (CLARITIN) 10 MG tablet take 1 tablet by mouth once daily 04/16/13  Yes Heather M Marte, PA-C  metoprolol succinate (TOPROL-XL) 25 MG 24 hr tablet Take 25 mg by mouth daily. 04/29/13  Yes Historical Provider, MD  Multiple Vitamin (MULITIVITAMIN WITH MINERALS) TABS Take 1 tablet by mouth daily.   Yes Historical Provider, MD  NASONEX 50 MCG/ACT nasal spray instill 2 sprays into each nostril once daily 05/07/13  Yes Eleanore E Egan, PA-C  nebivolol (BYSTOLIC) 2.5 MG tablet Take 1 tablet (2.5 mg total) by mouth daily. 05/13/13  Yes Vesta Mixer, MD  sertraline (ZOLOFT) 25 MG tablet Take 25 mg by mouth daily. 04/10/13  Yes Historical Provider, MD   Physical Exam: Filed Vitals:   05/16/13 1900  BP: 225/65  Pulse: 48  Temp:   Resp: 16    Constitutional: Vital signs reviewed.  Patient is a well-developed and well-nourished  in no acute distress and cooperative with exam. Alert and oriented x3.  Head: Normocephalic and atraumatic Nose: No erythema or drainage noted.  Turbinates normal Mouth: no erythema or exudates, MMM Eyes: PERRL, EOMI, conjunctivae normal, No scleral icterus.  Neck: Supple, Trachea midline normal ROM, No JVD, mass, thyromegaly, or carotid bruit present.  Cardiovascular: RRR, S1 normal, S2 normal, no MRG, pulses symmetric and intact bilaterally Pulmonary/Chest: normal respiratory effort, CTAB, no wheezes, rales, or rhonchi Abdominal: Soft. Non-tender, non-distended, bowel sounds are normal, no masses, organomegaly, or guarding present.  GU: no CVA tenderness  extremities: No cyanosis and no edema  Neurological: A&O x3, Strength is normal and symmetric bilaterally, cranial nerve II-XII are grossly intact, no focal motor deficit, sensory intact to light touch bilaterally.  Skin: Warm, dry and intact. No rash.  Psychiatric: Normal mood and affect. speech and behavior is normal.   Labs on Admission:  Basic Metabolic Panel:  Recent  Labs Lab 05/16/13 1130  NA 131*  K 3.9  CL 93*  CO2 30  GLUCOSE 106*  BUN 24*  CREATININE 0.83  CALCIUM 10.0   Liver Function Tests: No results found for this basename: AST, ALT, ALKPHOS, BILITOT, PROT, ALBUMIN,  in the last 168 hours No results found for this basename: LIPASE, AMYLASE,  in the last 168 hours No results found for this basename: AMMONIA,  in the last 168 hours CBC: No results found for this basename: WBC, NEUTROABS, HGB, HCT, MCV, PLT,  in the last 168 hours Cardiac Enzymes:  Recent Labs Lab 05/16/13 1130  TROPONINI <0.30    BNP (last 3 results) No results found for this basename: PROBNP,  in the last 8760 hours CBG:  Recent Labs Lab 05/16/13 1816  GLUCAP 106*    Radiological Exams on Admission: Dg Chest 2 View  05/16/2013   *RADIOLOGY REPORT*  Clinical Data: Hypertension, headaches  CHEST - 2 VIEW  Comparison: 04/08/2012  Findings: The cardiac shadow is stable.  The lungs are clear  bilaterally.  No focal infiltrate or effusion is seen.  No acute bony abnormality is noted.  IMPRESSION: No acute abnormality noted.   Original Report Authenticated By: Alcide Clever, M.D.   Ct Head Wo Contrast  05/16/2013   CLINICAL DATA:  Headache and confusion  EXAM: CT HEAD WITHOUT CONTRAST  TECHNIQUE: Contiguous axial images were obtained from the base of the skull through the vertex without intravenous contrast.  COMPARISON:  CT 04/08/2012  FINDINGS: Mild atrophy. Chronic microvascular ischemic change in the [place] matter and internal capsule bilaterally. Hypodensity left basal ganglia unchanged and most likely a benign cyst.  Negative for acute infarct, hemorrhage, or mass lesion. No acute skull abnormality.  IMPRESSION: Atrophy and chronic microvascular ischemia. No acute abnormality.   Electronically Signed   By: Marlan Palau M.D.   On: 05/16/2013 12:15      Assessment/Plan Active Problems:   HTN (hypertension), malignant/hypertensive encephalopathy -As discussed  above, will admit to step down to close monitoring -Place on Cardene drip keep systolic blood pressure about 784, will resume her oral meds in a.m. and wean off drip as BPs at tolerate -We'll cycle cardiac enzymes and follow. Chest x-ray has no infiltrates and she has no evidence of fluid overload on exam   HYPOTHYROIDISM -Continue Synthroid   Depression with anxiety -Continue outpatient   Headache -Secondary to #1, treat as above -CT scan of head negative for acute findings -Pain management and follow.   Hyponatremia -Likely secondary to volume depletion, hydrate follow and recheck     Code Status: full Family Communication: family at bedside Disposition Plan: admit to step down  Time spent: >30  Kela Millin [facility] Pager [phone]  If 7PM-7AM, please contact night-coverage [email or web address] Password [facility] 05/16/2013, 7:26 PM     \

## 2013-05-16 NOTE — Telephone Encounter (Signed)
Patient's son has some questions about several of patient's medications. States she was prescribed something by another doctor and needs to know if it should be taken instead of Lisinopril of in addition to her Lisinopril. Please call Kathlene November [phone].

## 2013-05-16 NOTE — Telephone Encounter (Signed)
Dr. Melburn Popper saw pt 05/13/13 and added Bystolic (at the lowest dose) which can be taken at the same time as Lisinopril. Dr. Melburn Popper noted that pt always anxious and when he rechecks her BP, the reading is much better. He will see her back in 6 months. Pt has an appt with me in mid- October. Note: pt went to [facility] this morning with elevated BP and HA; assessment was negative. She was admitted to hospital for observation this afternoon. I will try to call the son tomorrow.

## 2013-05-16 NOTE — Telephone Encounter (Signed)
She has seen Dr Elease Hashimoto, he added Bystolic to her Lisinopril advised him of this. He wants you to be aware, advised I will forward message to you.Lisa Sawyer

## 2013-05-16 NOTE — ED Provider Notes (Signed)
CSN: [id number]     Arrival date & time 05/16/13  1050 History   First MD Initiated Contact with Patient 05/16/13 1106     Chief Complaint  Patient presents with  . Headache   (Consider location/radiation/quality/duration/timing/severity/associated sxs/prior Treatment) HPI Comments: 79 yo female with htn, arthritis, anxiety hx presents with head pressure and uncontrolled htn.  Pt has had multiple recent episodes of bp in low 200s.  She was recently started on new medicines.  This morning she woke up "not feeling well" and had general pressure in head, similar to previous but more severe.  She could tell her bp was elevated.  No cp or sob today.  Recently she has random episodes of sob, brief, no heart hx.  Sxs worse when bp elevated.  Patient is a 79 y.o. female presenting with headaches. The history is provided by the patient.  Headache Associated symptoms: fatigue   Associated symptoms: no abdominal pain, no back pain, no fever, no neck pain, no neck stiffness and no vomiting     Past Medical History  Diagnosis Date  . Hypertension   . Thyroid disease   . Anemia   . Thrombocytopenia   . Osteoarthritis     left knee  . Hypothyroidism   . Hypercholesterolemia   . History of breast cancer   . Idiopathic thrombocytopenic purpura     A questionable history of idiopathic thrombocytopenic purpura  . Acute blood loss anemia     secondary to surgery, requiring blood transfusion  . Hypokalemia     treated  . Leukocytosis   . Pyrexia   . Anxiety attack    Past Surgical History  Procedure Laterality Date  . Cholecystectomy    . Colon surgery    . Knee reconstruction, medial patellar femoral ligament    . Total knee     Family History  Problem Relation Age of Onset  . Cancer Mother   . Heart disease Mother   . Heart disease Sister    History  Substance Use Topics  . Smoking status: Former Games developer  . Smokeless tobacco: Never Used  . Alcohol Use: No   OB History   Grav Para  Term Preterm Abortions TAB SAB Ect Mult Living                 Review of Systems  Constitutional: Positive for fatigue. Negative for fever and chills.  HENT: Negative for neck pain and neck stiffness.   Eyes: Negative for visual disturbance.  Respiratory: Positive for shortness of breath.   Cardiovascular: Negative for chest pain.  Gastrointestinal: Negative for vomiting and abdominal pain.  Genitourinary: Negative for dysuria and flank pain.  Musculoskeletal: Negative for back pain.  Skin: Negative for rash.  Neurological: Positive for headaches. Negative for light-headedness.    Allergies  Naprosyn and Zoloft  Home Medications   Current Outpatient Rx  Name  Route  Sig  Dispense  Refill  . ALPRAZolam (XANAX) 0.5 MG tablet      take 1/2-1 tablet by mouth twice a day if needed for anxiety   30 tablet   1   . aspirin EC 81 MG tablet   Oral   Take 81 mg by mouth at bedtime.          . Calcium Carb-Cholecalciferol (CALCIUM + D3 PO)   Oral   Take 1 tablet by mouth daily.         . fish oil-omega-3 fatty acids 1000 MG capsule  Oral   Take 1 g by mouth daily.          Marland Kitchen levothyroxine (SYNTHROID, LEVOTHROID) 100 MCG tablet   Oral   Take 1 tablet (100 mcg total) by mouth daily before breakfast.   30 tablet   5   . lisinopril (PRINIVIL,ZESTRIL) 40 MG tablet   Oral   Take 1 tablet (40 mg total) by mouth daily.   30 tablet   5   . loratadine (CLARITIN) 10 MG tablet      take 1 tablet by mouth once daily   30 tablet   5   . Multiple Vitamin (MULITIVITAMIN WITH MINERALS) TABS   Oral   Take 1 tablet by mouth daily.         Marland Kitchen NASONEX 50 MCG/ACT nasal spray      instill 2 sprays into each nostril once daily   17 g   12   . nebivolol (BYSTOLIC) 2.5 MG tablet   Oral   Take 1 tablet (2.5 mg total) by mouth daily.   30 tablet   6    BP 215/54  Temp(Src) 98.2 F (36.8 C) (Oral)  Resp 15 Physical Exam  Nursing note and vitals  reviewed. Constitutional: She is oriented to person, place, and time. She appears well-developed and well-nourished.  HENT:  Head: Normocephalic and atraumatic.  Eyes: Conjunctivae are normal. Right eye exhibits no discharge. Left eye exhibits no discharge.  Neck: Normal range of motion. Neck supple. No tracheal deviation present.  Cardiovascular: Normal rate and regular rhythm.   Pulmonary/Chest: Effort normal and breath sounds normal.  Abdominal: Soft. She exhibits no distension. There is no tenderness. There is no guarding.  Musculoskeletal: She exhibits no edema and no tenderness.  Neurological: She is alert and oriented to person, place, and time. GCS eye subscore is 4. GCS verbal subscore is 5. GCS motor subscore is 6.  5+ strength in UE and LE with f/e at major joints. Sensation to palpation intact in UE and LE. CNs 2-12 grossly intact.  EOMFI.  PERRL.   Finger nose and coordination intact bilateral.   Visual fields intact to finger testing.   Skin: Skin is warm. No rash noted.  Psychiatric: She has a normal mood and affect.    ED Course  Procedures (including critical care time) Labs Review Labs Reviewed  BASIC METABOLIC PANEL - Abnormal; Notable for the following:    Sodium 131 (*)    Chloride 93 (*)    Glucose, Bld 106 (*)    BUN 24 (*)    GFR calc non Af Amer 65 (*)    GFR calc Af Amer 76 (*)    All other components within normal limits  TROPONIN I   Imaging Review Dg Chest 2 View  05/16/2013   *RADIOLOGY REPORT*  Clinical Data: Hypertension, headaches  CHEST - 2 VIEW  Comparison: 04/08/2012  Findings: The cardiac shadow is stable.  The lungs are clear bilaterally.  No focal infiltrate or effusion is seen.  No acute bony abnormality is noted.  IMPRESSION: No acute abnormality noted.   Original Report Authenticated By: Alcide Clever, M.D.    MDM  No diagnosis found. Uncontrolled htn.  Date: 05/16/2013  Rate: 52  Rhythm: normal sinus rhythm  QRS Axis:  indeterminate  Intervals: QT prolonged  ST/T Wave abnormalities: nonspecific ST changes  Conduction Disutrbances First degree av block  Narrative Interpretation:   Old EKG Reviewed: unchanged   Reviewed pcp outpt note.  Anxiety  component, pt has been stressed taking care of family. BP mild improvement in ED.  Discussed with pt and Dr Seth Bake, okay with tele observation after second labetalol dose. Updated pt.  No signs of end organ damage.   Uncontrolled HTN, Headache   Enid Skeens, MD 05/16/13 1527

## 2013-05-16 NOTE — ED Notes (Signed)
Patient states she woke up with a headache and feeling weak . Patient states she had been having headaches since January. Patient feels it is related to her BP being high. Patient is here today because she feels that her headache today was worst than in the past.

## 2013-05-16 NOTE — ED Notes (Signed)
Bed: ZO10 Expected date:  Expected time:  Means of arrival:  Comments: ems-headache x20months

## 2013-05-16 NOTE — ED Notes (Signed)
4th states uncomfortable accepting pt d/t hypertension, states will call MD and call us back; pt is stable at this time, EDP notified

## 2013-05-17 ENCOUNTER — Telehealth: Payer: Self-pay | Admitting: Family Medicine

## 2013-05-17 DIAGNOSIS — F341 Dysthymic disorder: Secondary | ICD-10-CM

## 2013-05-17 DIAGNOSIS — I10 Essential (primary) hypertension: Secondary | ICD-10-CM

## 2013-05-17 DIAGNOSIS — R51 Headache: Secondary | ICD-10-CM

## 2013-05-17 DIAGNOSIS — E871 Hypo-osmolality and hyponatremia: Secondary | ICD-10-CM

## 2013-05-17 DIAGNOSIS — E039 Hypothyroidism, unspecified: Secondary | ICD-10-CM

## 2013-05-17 LAB — BASIC METABOLIC PANEL
CO2: 30 mEq/L (ref 19–32)
Chloride: 94 mEq/L — ABNORMAL LOW (ref 96–112)
Sodium: 133 mEq/L — ABNORMAL LOW (ref 135–145)

## 2013-05-17 LAB — CBC
Platelets: 278 10*3/uL (ref 150–400)
RBC: 4.12 MIL/uL (ref 3.87–5.11)
RDW: 12.9 % (ref 11.5–15.5)
WBC: 6 10*3/uL (ref 4.0–10.5)

## 2013-05-17 LAB — TROPONIN I: Troponin I: <0.3 ng/mL (ref <0.30)

## 2013-05-17 MED ORDER — FAMOTIDINE 20 MG PO TABS
20.0000 mg | ORAL_TABLET | Freq: Two times a day (BID) | ORAL | Status: DC
  Administered 2013-05-17 – 2013-05-20 (×7): 20 mg via ORAL
  Filled 2013-05-17 (×8): qty 1

## 2013-05-17 MED ORDER — NEBIVOLOL HCL 2.5 MG PO TABS
2.5000 mg | ORAL_TABLET | Freq: Once | ORAL | Status: AC
  Administered 2013-05-17: 2.5 mg via ORAL
  Filled 2013-05-17: qty 1

## 2013-05-17 MED ORDER — NEBIVOLOL HCL 5 MG PO TABS
5.0000 mg | ORAL_TABLET | Freq: Every day | ORAL | Status: DC
  Administered 2013-05-18 – 2013-05-20 (×3): 5 mg via ORAL
  Filled 2013-05-17 (×3): qty 1

## 2013-05-17 MED ORDER — HYDRALAZINE HCL 20 MG/ML IJ SOLN
10.0000 mg | INTRAMUSCULAR | Status: DC | PRN
  Administered 2013-05-17 – 2013-05-19 (×5): 20 mg via INTRAVENOUS
  Filled 2013-05-17 (×6): qty 1

## 2013-05-17 NOTE — Telephone Encounter (Signed)
I spoke w/ pt's son, Lisa Sawyer, who lives in [place] and drives a truck. He is involved in his mother's care now, speaking w/ her 7-8 times a day, prompting her to take her medications and eat small meals/ snacks regularly. He notes her increased anxiety and cognitive changes. The family is planning to have pt visit son in [place] for 6 months; the start date for this transition is October 2014. We discussed current hospitalization (malignant HTN). I advised son that pt may need an evaluation for cognitive decline. Part of plan going forward will be to see how [facility] can assist with monitoring pt in the home. Lisa Sawyer spoke w/ Wynonia Lawman who did the home visit for [facility] on 05/14/13. It is not clear going forward when next visit is planned but I will be contacting her for this info. Lisa Sawyer will be visiting pt next weekend. Lisa Sawyer has provided his e-mail so that he can be his mother's advocate.  Lisa Sawyer [place]'s email: [email or web address]

## 2013-05-17 NOTE — Telephone Encounter (Signed)
Much appreciated. Just off the phone after lengthy conversation w/ son. We discussed family's plan for pt and other evaluations that may need to occur (cognitive assessment). See phone note w/ today's date. He provided his e-mail to facilitate communication.

## 2013-05-17 NOTE — Telephone Encounter (Signed)
Thank you. I called also, to offer reassurance, her son seems very attentive to her. He indicates she is now in ICU at the hospital, he states he is here to help her from [place]. She has been very anxious and sick. Her son is advised you will try to call also.

## 2013-05-17 NOTE — Progress Notes (Signed)
[facility] PROGRESS NOTE  Lisa Sawyer ZOX:[id number] DOB: 13-Sep-1931 DOA: 05/16/2013 PCP: Dow Adolph, MD  Assessment/Plan: Active Problems:  HTN (hypertension), malignant/hypertensive encephalopathy  -Better blood pressure control this a.m, wean off Cardene drip once oral meds started this am -Cardiac enzymes negative -Obtain orthostatics given complains about dizziness this am and follow -Patient education on importance of compliance with medications or requesting medication change with outpatient MDs if she has concerns or questions. -Continue monitoring in step down for today till weaned off Cardene drip HYPOTHYROIDISM  -Continue Synthroid  Depression with anxiety  -Continue outpatient meds  Headache  -Secondary to #1, resolved with treatment as above  -CT scan of head negative for acute findings  -Pain management and follow.  Hyponatremia  -Likely secondary to volume depletion, hydrate follow and recheck GERD/dyspepsia -Place on Pepcid(she states she was on a PPI previously but stopped it because of concern about her bones) Hyponatremia -Improved with hydration  Code Status: full Family Communication: none at bedside  Disposition Plan: to home when medically ready   Consultants:  none  Procedures:  none  Antibiotics:  none  HPI/Subjective: C/o epigastric discomfort and admits , and feels light headed with sitting up this am. No headaches reported today  Objective: Filed Vitals:   05/17/13 0800  BP: 144/53  Pulse: 67  Temp:   Resp: 17    Intake/Output Summary (Last 24 hours) at 05/17/13 0837 Last data filed at 05/17/13 0800  Gross per 24 hour  Intake 1131.67 ml  Output   3250 ml  Net -2118.33 ml   Filed Weights   05/16/13 1855 05/17/13 0400  Weight: 61.5 kg (135 lb 9.3 oz) 60.9 kg (134 lb 4.2 oz)    Exam:  General: alert & oriented x 3 In NAD Cardiovascular: RRR, nl S1 s2 Respiratory: CTAB Abdomen: soft +BS NT/ND, no masses  palpable Extremities: No cyanosis and no edema   Data Reviewed: Basic Metabolic Panel:  Recent Labs Lab 05/16/13 1130 05/17/13 0130  NA 131* 133*  K 3.9 3.9  CL 93* 94*  CO2 30 30  GLUCOSE 106* 134*  BUN 24* 19  CREATININE 0.83 0.77  CALCIUM 10.0 10.2   Liver Function Tests: No results found for this basename: AST, ALT, ALKPHOS, BILITOT, PROT, ALBUMIN,  in the last 168 hours No results found for this basename: LIPASE, AMYLASE,  in the last 168 hours No results found for this basename: AMMONIA,  in the last 168 hours CBC:  Recent Labs Lab 05/16/13 2007 05/17/13 0130  WBC 5.5 6.0  HGB 11.8* 12.8  HCT 36.0 38.7  MCV 94.5 93.9  PLT 261 278   Cardiac Enzymes:  Recent Labs Lab 05/16/13 1130 05/16/13 2007 05/17/13 0130 05/17/13 0700  TROPONINI <0.30 <0.30 <0.30 <0.30   BNP (last 3 results) No results found for this basename: PROBNP,  in the last 8760 hours CBG:  Recent Labs Lab 05/16/13 1816  GLUCAP 106*    Recent Results (from the past 240 hour(s))  MRSA PCR SCREENING     Status: None   Collection Time    05/16/13  7:07 PM      Result Value Range Status   MRSA by PCR NEGATIVE  NEGATIVE Final   Comment:            The GeneXpert MRSA Assay (FDA     approved for NASAL specimens     only), is one component of a     comprehensive MRSA colonization  surveillance program. It is not     intended to diagnose MRSA     infection nor to guide or     monitor treatment for     MRSA infections.     Studies: Dg Chest 2 View  05/16/2013   *RADIOLOGY REPORT*  Clinical Data: Hypertension, headaches  CHEST - 2 VIEW  Comparison: 04/08/2012  Findings: The cardiac shadow is stable.  The lungs are clear bilaterally.  No focal infiltrate or effusion is seen.  No acute bony abnormality is noted.  IMPRESSION: No acute abnormality noted.   Original Report Authenticated By: Alcide Clever, M.D.   Ct Head Wo Contrast  05/16/2013   CLINICAL DATA:  Headache and confusion   EXAM: CT HEAD WITHOUT CONTRAST  TECHNIQUE: Contiguous axial images were obtained from the base of the skull through the vertex without intravenous contrast.  COMPARISON:  CT 04/08/2012  FINDINGS: Mild atrophy. Chronic microvascular ischemic change in the [place] matter and internal capsule bilaterally. Hypodensity left basal ganglia unchanged and most likely a benign cyst.  Negative for acute infarct, hemorrhage, or mass lesion. No acute skull abnormality.  IMPRESSION: Atrophy and chronic microvascular ischemia. No acute abnormality.   Electronically Signed   By: Marlan Palau M.D.   On: 05/16/2013 12:15    Scheduled Meds: . aspirin EC  81 mg Oral QHS  . calcium-vitamin D  1 tablet Oral Daily  . cholecalciferol  1,000 Units Oral Daily  . enoxaparin (LOVENOX) injection  40 mg Subcutaneous Q24H  . fluticasone  1 spray Each Nare Daily  . levothyroxine  100 mcg Oral QAC breakfast  . lisinopril  40 mg Oral Daily  . loratadine  10 mg Oral Daily  . multivitamin with minerals  1 tablet Oral Daily  . nebivolol  2.5 mg Oral Daily  . sertraline  12.5 mg Oral QHS  . sodium chloride  3 mL Intravenous Q12H   Continuous Infusions: . sodium chloride 50 mL/hr at 05/16/13 2022  . niCARDipine 2.5 mg/hr (05/17/13 0233)    Active Problems:   HYPOTHYROIDISM   Depression with anxiety   HTN (hypertension), malignant   Headache   Hyponatremia    Time spent: 35    [facility]  [facility] Pager [phone]. If 7PM-7AM, please contact night-coverage at [email or web address], password [facility] 05/17/2013, 8:37 AM  LOS: 1 day

## 2013-05-17 NOTE — Progress Notes (Signed)
Orthostatic blood pressures: Lying:173/42 Sitting: 151/52 Standing 132/53

## 2013-05-18 DIAGNOSIS — I10 Essential (primary) hypertension: Secondary | ICD-10-CM

## 2013-05-18 LAB — BASIC METABOLIC PANEL
CO2: 26 mEq/L (ref 19–32)
Calcium: 10.3 mg/dL (ref 8.4–10.5)
Chloride: 99 mEq/L (ref 96–112)
GFR calc Af Amer: 79 mL/min — ABNORMAL LOW (ref >90)
Sodium: 135 mEq/L (ref 135–145)

## 2013-05-18 NOTE — Progress Notes (Signed)
[facility] PROGRESS NOTE  Lisa STEGEMANN VWU:[id number] DOB: [birth year] DOA: 05/16/2013 PCP: Dow Adolph, MD  Assessment/Plan: Active Problems:  HTN (hypertension), malignant/hypertensive encephalopathy  -Pt was placed on Cardene drip on admission -blood pressure control improved and she was weaned off the Cardene drip 9/19, after po meds were resumed, but developed uncontrolled blood pressures last p.m. requiring when necessary hydralazine  -Continue on increased systolic, lisinopril when necessary hydralazine-monitor pressures in step down today and if remains stable and transferred to telemetry -pt was orthostatic 9/19 on IV fluids increased, will recheck orthostatics this a.m. and if still orthostatic will bolus and follow -Patient educated 9/19 on importance of compliance with medications or requesting medication change with outpatient MDs if she has concerns or questions. HYPOTHYROIDISM  -Continue Synthroid  Depression with anxiety  -Continue outpatient meds  Headache  -Secondary to #1, resolved with treatment as above  -CT scan of head negative for acute findings  -Pain management and follow.  Hyponatremia  -Likely secondary to volume depletion, resolved with hydration GERD/dyspepsia -continue Pepcid(she states she was on a PPI previously but stopped it because of concern about her bones)   Code Status: full Family Communication: none at bedside  Disposition Plan: to home when medically ready   Consultants:  none  Procedures:  none  Antibiotics:  none  HPI/Subjective: Per nursing some confusion last p.m.-alert and oriented x3 this a.m. and appropriate. Still with some dizziness with sitting up  Objective: Filed Vitals:   05/18/13 1011  BP: 176/46  Pulse:   Temp:   Resp:     Intake/Output Summary (Last 24 hours) at 05/18/13 1037 Last data filed at 05/18/13 0600  Gross per 24 hour  Intake 1232.08 ml  Output   2300 ml  Net -1067.92 ml    Filed Weights   05/16/13 1855 05/17/13 0400 05/18/13 0400  Weight: 61.5 kg (135 lb 9.3 oz) 60.9 kg (134 lb 4.2 oz) 60.4 kg (133 lb 2.5 oz)    Exam:  General: alert & oriented x 3 In NAD Cardiovascular: RRR, nl S1 s2 Respiratory: CTAB Abdomen: soft +BS NT/ND, no masses palpable Extremities: No cyanosis and no edema   Data Reviewed: Basic Metabolic Panel:  Recent Labs Lab 05/16/13 1130 05/17/13 0130 05/18/13 0400  NA 131* 133* 135  K 3.9 3.9 4.2  CL 93* 94* 99  CO2 30 30 26   GLUCOSE 106* 134* 152*  BUN 24* 19 15  CREATININE 0.83 0.77 0.80  CALCIUM 10.0 10.2 10.3   Liver Function Tests: No results found for this basename: AST, ALT, ALKPHOS, BILITOT, PROT, ALBUMIN,  in the last 168 hours No results found for this basename: LIPASE, AMYLASE,  in the last 168 hours No results found for this basename: AMMONIA,  in the last 168 hours CBC:  Recent Labs Lab 05/16/13 2007 05/17/13 0130  WBC 5.5 6.0  HGB 11.8* 12.8  HCT 36.0 38.7  MCV 94.5 93.9  PLT 261 278   Cardiac Enzymes:  Recent Labs Lab 05/16/13 1130 05/16/13 2007 05/17/13 0130 05/17/13 0700  TROPONINI <0.30 <0.30 <0.30 <0.30   BNP (last 3 results) No results found for this basename: PROBNP,  in the last 8760 hours CBG:  Recent Labs Lab 05/16/13 1816  GLUCAP 106*    Recent Results (from the past 240 hour(s))  MRSA PCR SCREENING     Status: None   Collection Time    05/16/13  7:07 PM      Result Value Range Status   MRSA  by PCR NEGATIVE  NEGATIVE Final   Comment:            The GeneXpert MRSA Assay (FDA     approved for NASAL specimens     only), is one component of a     comprehensive MRSA colonization     surveillance program. It is not     intended to diagnose MRSA     infection nor to guide or     monitor treatment for     MRSA infections.     Studies: Dg Chest 2 View  05/16/2013   *RADIOLOGY REPORT*  Clinical Data: Hypertension, headaches  CHEST - 2 VIEW  Comparison: 04/08/2012   Findings: The cardiac shadow is stable.  The lungs are clear bilaterally.  No focal infiltrate or effusion is seen.  No acute bony abnormality is noted.  IMPRESSION: No acute abnormality noted.   Original Report Authenticated By: Alcide Clever, M.D.   Ct Head Wo Contrast  05/16/2013   CLINICAL DATA:  Headache and confusion  EXAM: CT HEAD WITHOUT CONTRAST  TECHNIQUE: Contiguous axial images were obtained from the base of the skull through the vertex without intravenous contrast.  COMPARISON:  CT 04/08/2012  FINDINGS: Mild atrophy. Chronic microvascular ischemic change in the Hesler matter and internal capsule bilaterally. Hypodensity left basal ganglia unchanged and most likely a benign cyst.  Negative for acute infarct, hemorrhage, or mass lesion. No acute skull abnormality.  IMPRESSION: Atrophy and chronic microvascular ischemia. No acute abnormality.   Electronically Signed   By: Marlan Palau M.D.   On: 05/16/2013 12:15    Scheduled Meds: . aspirin EC  81 mg Oral QHS  . calcium-vitamin D  1 tablet Oral Daily  . cholecalciferol  1,000 Units Oral Daily  . enoxaparin (LOVENOX) injection  40 mg Subcutaneous Q24H  . famotidine  20 mg Oral BID  . fluticasone  1 spray Each Nare Daily  . levothyroxine  100 mcg Oral QAC breakfast  . lisinopril  40 mg Oral Daily  . loratadine  10 mg Oral Daily  . multivitamin with minerals  1 tablet Oral Daily  . nebivolol  5 mg Oral Daily  . sertraline  12.5 mg Oral QHS  . sodium chloride  3 mL Intravenous Q12H   Continuous Infusions: . sodium chloride 75 mL/hr at 05/18/13 0802  . niCARDipine Stopped (05/17/13 1826)    Active Problems:   HYPOTHYROIDISM   Depression with anxiety   HTN (hypertension), malignant   Headache   Hyponatremia    Time spent: 25    [facility] C  [facility] Pager [phone]. If 7PM-7AM, please contact night-coverage at [email or web address], password [facility] 05/18/2013, 10:37 AM  LOS: 2 days

## 2013-05-18 NOTE — Progress Notes (Signed)
Pt has intermittent confusion that is different from her baseline.  She is oriented X4 but having confusion on what has happened in the last day. Vitals are stable and notified MD about increasing confusion. Will continue to monitor pt.

## 2013-05-19 DIAGNOSIS — N390 Urinary tract infection, site not specified: Secondary | ICD-10-CM

## 2013-05-19 LAB — BASIC METABOLIC PANEL
CO2: 28 mEq/L (ref 19–32)
Calcium: 9.7 mg/dL (ref 8.4–10.5)
GFR calc non Af Amer: 46 mL/min — ABNORMAL LOW (ref >90)
Glucose, Bld: 96 mg/dL (ref 70–99)
Potassium: 4.2 mEq/L (ref 3.5–5.1)
Sodium: 130 mEq/L — ABNORMAL LOW (ref 135–145)

## 2013-05-19 LAB — URINALYSIS, ROUTINE W REFLEX MICROSCOPIC
Hgb urine dipstick: NEGATIVE
Protein, ur: NEGATIVE mg/dL
Specific Gravity, Urine: 1.018 (ref 1.005–1.030)
Urobilinogen, UA: 0.2 mg/dL (ref 0.0–1.0)

## 2013-05-19 LAB — COMPREHENSIVE METABOLIC PANEL
ALT: 22 U/L (ref 0–35)
Alkaline Phosphatase: 54 U/L (ref 39–117)
BUN: 19 mg/dL (ref 6–23)
CO2: 27 mEq/L (ref 19–32)
Chloride: 97 mEq/L (ref 96–112)
GFR calc Af Amer: 47 mL/min — ABNORMAL LOW (ref >90)
GFR calc non Af Amer: 40 mL/min — ABNORMAL LOW (ref >90)
Glucose, Bld: 106 mg/dL — ABNORMAL HIGH (ref 70–99)
Potassium: 4.4 mEq/L (ref 3.5–5.1)
Sodium: 128 mEq/L — ABNORMAL LOW (ref 135–145)
Total Bilirubin: 0.2 mg/dL — ABNORMAL LOW (ref 0.3–1.2)

## 2013-05-19 LAB — URINE MICROSCOPIC-ADD ON

## 2013-05-19 MED ORDER — DEXTROSE 5 % IV SOLN
1.0000 g | Freq: Every day | INTRAVENOUS | Status: DC
  Administered 2013-05-19 – 2013-05-20 (×2): 1 g via INTRAVENOUS
  Filled 2013-05-19 (×2): qty 10

## 2013-05-19 MED ORDER — CIPROFLOXACIN IN D5W 400 MG/200ML IV SOLN
400.0000 mg | Freq: Two times a day (BID) | INTRAVENOUS | Status: DC
  Administered 2013-05-19: 400 mg via INTRAVENOUS
  Filled 2013-05-19 (×2): qty 200

## 2013-05-19 NOTE — Progress Notes (Addendum)
[facility] PROGRESS NOTE  Lisa Sawyer ZOX:[id number] DOB: 1931-12-13 DOA: 05/16/2013 PCP: Dow Adolph, MD  Assessment/Plan: Active Problems:  HTN (hypertension), malignant/hypertensive encephalopathy  -Pt was placed on Cardene drip on admission -blood pressure control improved and she was weaned off the Cardene drip 9/19, after po meds were resumed, but developed uncontrolled blood pressures p.m.9/20 requiring when necessary hydralazine  -Continue on increased Bystolic, lisinopril when necessary hydralazine-monitor pressures in step down today and if remains stable and transferred to telemetry -pt was orthostatic 9/19 on IV fluids increased, will recheck orthostatics ON 9/20 wnl and IVF decreased to KVO -BP control improved, we'll transfer to telemetry, PT OT consult -Patient educated 9/19 on importance of compliance with medications or requesting medication change with outpatient MDs if she has concerns or questions. HYPOTHYROIDISM  -Continue Synthroid  Depression with anxiety  -Continue outpatient meds  Headache  -Secondary to #1, resolved with treatment as above  -CT scan of head negative for acute findings  -Pain management and follow.  Hyponatremia  -Likely secondary to volume depletion -follow and recheck GERD/dyspepsia -continue Pepcid(she states she was on a PPI previously but stopped it because of concern about her bones) UTI -Urinalysis consistent with UTI, agree with empiric antibiotics-change to Rocephin>>follow urine culture -likely etiology for intermittent confusion/acute toxic encephalopathy  Code Status: full Family Communication: none at bedside  Disposition Plan: to home when medically ready   Consultants:  none  Procedures:  none  Antibiotics:  none  HPI/Subjective: Per nursing some confusion again last p.m.. States decreased dizziness, alert and appropriate.  Objective: Filed Vitals:   05/19/13 0700  BP: 173/52  Pulse: 56   Temp:   Resp: 17    Intake/Output Summary (Last 24 hours) at 05/19/13 0851 Last data filed at 05/19/13 0800  Gross per 24 hour  Intake 2164.08 ml  Output   1700 ml  Net 464.08 ml   Filed Weights   05/16/13 1855 05/17/13 0400 05/18/13 0400  Weight: 61.5 kg (135 lb 9.3 oz) 60.9 kg (134 lb 4.2 oz) 60.4 kg (133 lb 2.5 oz)    Exam:  General: alert & oriented x 3 In NAD Cardiovascular: RRR, nl S1 s2 Respiratory: CTAB Abdomen: soft +BS NT/ND, no masses palpable Extremities: No cyanosis and no edema   Data Reviewed: Basic Metabolic Panel:  Recent Labs Lab 05/16/13 1130 05/17/13 0130 05/18/13 0400 05/18/13 2336 05/19/13 0350  NA 131* 133* 135 128* 130*  K 3.9 3.9 4.2 4.4 4.2  CL 93* 94* 99 97 97  CO2 30 30 26 27 28   GLUCOSE 106* 134* 152* 106* 96  BUN 24* 19 15 19 18   CREATININE 0.83 0.77 0.80 1.24* 1.11*  CALCIUM 10.0 10.2 10.3 9.5 9.7   Liver Function Tests:  Recent Labs Lab 05/18/13 2336  AST 20  ALT 22  ALKPHOS 54  BILITOT 0.2*  PROT 6.0  ALBUMIN 2.9*   No results found for this basename: LIPASE, AMYLASE,  in the last 168 hours No results found for this basename: AMMONIA,  in the last 168 hours CBC:  Recent Labs Lab 05/16/13 2007 05/17/13 0130  WBC 5.5 6.0  HGB 11.8* 12.8  HCT 36.0 38.7  MCV 94.5 93.9  PLT 261 278   Cardiac Enzymes:  Recent Labs Lab 05/16/13 1130 05/16/13 2007 05/17/13 0130 05/17/13 0700  TROPONINI <0.30 <0.30 <0.30 <0.30   BNP (last 3 results) No results found for this basename: PROBNP,  in the last 8760 hours CBG:  Recent Labs Lab 05/16/13  1816  GLUCAP 106*    Recent Results (from the past 240 hour(s))  MRSA PCR SCREENING     Status: None   Collection Time    05/16/13  7:07 PM      Result Value Range Status   MRSA by PCR NEGATIVE  NEGATIVE Final   Comment:            The GeneXpert MRSA Assay (FDA     approved for NASAL specimens     only), is one component of a     comprehensive MRSA colonization      surveillance program. It is not     intended to diagnose MRSA     infection nor to guide or     monitor treatment for     MRSA infections.     Studies: No results found.  Scheduled Meds: . aspirin EC  81 mg Oral QHS  . calcium-vitamin D  1 tablet Oral Daily  . cholecalciferol  1,000 Units Oral Daily  . ciprofloxacin  400 mg Intravenous BID  . enoxaparin (LOVENOX) injection  40 mg Subcutaneous Q24H  . famotidine  20 mg Oral BID  . fluticasone  1 spray Each Nare Daily  . levothyroxine  100 mcg Oral QAC breakfast  . lisinopril  40 mg Oral Daily  . loratadine  10 mg Oral Daily  . multivitamin with minerals  1 tablet Oral Daily  . nebivolol  5 mg Oral Daily  . sertraline  12.5 mg Oral QHS  . sodium chloride  3 mL Intravenous Q12H   Continuous Infusions: . sodium chloride 20 mL/hr at 05/19/13 0059  . niCARDipine Stopped (05/17/13 1826)    Active Problems:   HYPOTHYROIDISM   Depression with anxiety   HTN (hypertension), malignant   Headache   Hyponatremia    Time spent: 35    [facility] C  [facility] Pager [phone]. If 7PM-7AM, please contact night-coverage at [email or web address], password [facility] 05/19/2013, 8:51 AM  LOS: 3 days

## 2013-05-19 NOTE — Progress Notes (Signed)
Report called to Kana, RN on 4E.

## 2013-05-19 NOTE — Progress Notes (Signed)
ANTIBIOTIC CONSULT NOTE - INITIAL  Pharmacy Consult for Cipro Indication: UTI  Allergies  Allergen Reactions  . Naprosyn [Naproxen] Itching  . Zoloft [Sertraline Hcl]     Confusion, blurred vision    Patient Measurements: Height: 5\' 2"  (157.5 cm) Weight: 133 lb 2.5 oz (60.4 kg) IBW/kg (Calculated) : 50.1 Adjusted Body Weight:   Vital Signs: Temp: 98.2 F (36.8 C) (09/21 0400) Temp src: Oral (09/21 0400) BP: 140/41 mmHg (09/21 0500) Pulse Rate: 61 (09/21 0500) Intake/Output from previous day: 09/20 0701 - 09/21 0700 In: 2181.6 [P.O.:720; I.V.:1261.6; IV Piggyback:200] Out: 1250 [Urine:1250] Intake/Output from this shift: Total I/O In: 709.1 [I.V.:509.1; IV Piggyback:200] Out: 900 [Urine:900]  Labs:  Recent Labs  05/16/13 2007 05/17/13 0130 05/18/13 0400 05/18/13 2336 05/19/13 0350  WBC 5.5 6.0  --   --   --   HGB 11.8* 12.8  --   --   --   PLT 261 278  --   --   --   CREATININE  --  0.77 0.80 1.24* 1.11*   Estimated Creatinine Clearance: 35.2 ml/min (by C-G formula based on Cr of 1.11). No results found for this basename: VANCOTROUGH, Leodis Binet, VANCORANDOM, GENTTROUGH, GENTPEAK, GENTRANDOM, TOBRATROUGH, TOBRAPEAK, TOBRARND, AMIKACINPEAK, AMIKACINTROU, AMIKACIN,  in the last 72 hours   Microbiology: Recent Results (from the past 720 hour(s))  MRSA PCR SCREENING     Status: None   Collection Time    05/16/13  7:07 PM      Result Value Range Status   MRSA by PCR NEGATIVE  NEGATIVE Final   Comment:            The GeneXpert MRSA Assay (FDA     approved for NASAL specimens     only), is one component of a     comprehensive MRSA colonization     surveillance program. It is not     intended to diagnose MRSA     infection nor to guide or     monitor treatment for     MRSA infections.    Medical History: Past Medical History  Diagnosis Date  . Hypertension   . Thyroid disease   . Anemia   . Thrombocytopenia   . Osteoarthritis     left knee  .  Hypothyroidism   . Hypercholesterolemia   . History of breast cancer   . Idiopathic thrombocytopenic purpura     A questionable history of idiopathic thrombocytopenic purpura  . Acute blood loss anemia     secondary to surgery, requiring blood transfusion  . Hypokalemia     treated  . Leukocytosis   . Pyrexia   . Anxiety attack   . breast ca dx'd 2009    pt states pre ca; lumpectomy-lt w/ xrt    Medications:  Anti-infectives   Start     Dose/Rate Route Frequency Ordered Stop   05/19/13 0430  ciprofloxacin (CIPRO) IVPB 400 mg     400 mg 200 mL/hr over 60 Minutes Intravenous 2 times daily 05/19/13 0405       Assessment: Patient with UTI.   Goal of Therapy:  Cipro dosed based on patient weight and renal function   Plan:  Follow up culture results Cipro 400mg  iv q12hr  Aleene Davidson Crowford 05/19/2013,5:30 AM

## 2013-05-19 NOTE — Progress Notes (Signed)
eLink Physician-Brief Progress Note Patient Name: Lisa Sawyer DOB: Feb 02, 1932 MRN: [id number]  Date of Service  05/19/2013   HPI/Events of Note  Patient admitted with hypertensive urgency placed on cardene gtt now on po antihypertensive meds taking a diet.  Current IVFs at 75 cc/hr.   eICU Interventions  Plan: Suspect aspect of volume to hypertension - now taking pos. KVO IVFs   Intervention Category Minor Interventions: Routine modifications to care plan (e.g. PRN medications for pain, fever)  DETERDING,ELIZABETH 05/19/2013, 12:24 AM

## 2013-05-19 NOTE — Evaluation (Signed)
Physical Therapy Evaluation Patient Details Name: Lisa Sawyer MRN: [id number] DOB: 14-Feb-1932 Today's Date: 05/19/2013 Time: 7829-5621 PT Time Calculation (min): 18 min  PT Assessment / Plan / Recommendation History of Present Illness  79 y.o. female admitted with HA, confusion, UTI, HTN, hyponatremia.  Clinical Impression  *Pt would benefit from acute PT to maximize safety and independence with mobility. See PT problem list below. **    PT Assessment  Patient needs continued PT services    Follow Up Recommendations  Home health PT    Does the patient have the potential to tolerate intense rehabilitation      Barriers to Discharge        Equipment Recommendations  None recommended by PT    Recommendations for Other Services     Frequency Min 3X/week    Precautions / Restrictions Precautions Precautions: None Restrictions Weight Bearing Restrictions: No   Pertinent Vitals/Pain *0/10 pain**      Mobility  Bed Mobility Bed Mobility: Supine to Sit Supine to Sit: 7: Independent Transfers Transfers: Sit to Stand;Stand to Sit Sit to Stand: 5: Supervision;From bed Stand to Sit: 5: Supervision;To chair/3-in-1;With upper extremity assist Details for Transfer Assistance: VCs hand placement Ambulation/Gait Ambulation/Gait Assistance: 5: Supervision Ambulation Distance (Feet): 250 Feet Assistive device: Rolling walker Gait Pattern: Within Functional Limits General Gait Details: steady, no LOB    Exercises     PT Diagnosis: Generalized weakness  PT Problem List: Decreased activity tolerance;Decreased mobility PT Treatment Interventions: Gait training;Stair training;Patient/family education     PT Goals(Current goals can be found in the care plan section) Acute Rehab PT Goals Patient Stated Goal: to return home PT Goal Formulation: With patient Time For Goal Achievement: 06/02/13 Potential to Achieve Goals: Good  Visit Information  Last PT Received On:  05/19/13 Assistance Needed: +1 History of Present Illness: 79 y.o. female admitted with HA, confusion, UTI, HTN, hyponatremia.       Prior Functioning  Home Living Family/patient expects to be discharged to:: Private residence Living Arrangements: Alone Type of Home: House Home Access: Stairs to enter [employer] of Steps: 5 Entrance Stairs-Rails: Can reach both;Left;Right Home Layout: One level Home Equipment: Walker - 2 wheels;Bedside commode Additional Comments: pt denies falls at home but stated she's had "a few stumbles" Prior Function Level of Independence: Independent Communication Communication: No difficulties    Cognition  Cognition Arousal/Alertness: Awake/alert Behavior During Therapy: WFL for tasks assessed/performed Overall Cognitive Status: Within Functional Limits for tasks assessed    Extremity/Trunk Assessment Upper Extremity Assessment Upper Extremity Assessment: Overall WFL for tasks assessed Lower Extremity Assessment Lower Extremity Assessment: Overall WFL for tasks assessed Cervical / Trunk Assessment Cervical / Trunk Assessment: Normal   Balance Balance Balance Assessed: Yes Static Sitting Balance Static Sitting - Balance Support: No upper extremity supported;Feet supported Static Sitting - Level of Assistance: 7: Independent Static Sitting - Comment/# of Minutes: 2  End of Session PT - End of Session Equipment Utilized During Treatment: Gait belt Activity Tolerance: Patient tolerated treatment well Patient left: in chair;with call bell/phone within reach;with chair alarm set Nurse Communication: Mobility status  GP     Ralene Bathe Kistler 05/19/2013, 2:39 PM [phone]

## 2013-05-19 NOTE — Progress Notes (Signed)
Received patient as transfer from ICU.  Agree with previous RN's assessment of patient, with the exception that her IV was found to be leaking.  Patient has walked with PT and is alert, oriented, and resting comfortably in the bed awaiting IV restart from IV team.  Telemetry confirmed with Darryl from CMT.  Will continue to monitor.

## 2013-05-20 ENCOUNTER — Telehealth: Payer: Self-pay

## 2013-05-20 LAB — BASIC METABOLIC PANEL
BUN: 18 mg/dL (ref 6–23)
CO2: 27 mEq/L (ref 19–32)
Calcium: 9.9 mg/dL (ref 8.4–10.5)
Creatinine, Ser: 1.04 mg/dL (ref 0.50–1.10)
GFR calc non Af Amer: 50 mL/min — ABNORMAL LOW (ref >90)
Glucose, Bld: 103 mg/dL — ABNORMAL HIGH (ref 70–99)
Sodium: 133 mEq/L — ABNORMAL LOW (ref 135–145)

## 2013-05-20 MED ORDER — FAMOTIDINE 20 MG PO TABS: 20.0000 mg | ORAL_TABLET | Freq: Two times a day (BID) | ORAL | Status: DC

## 2013-05-20 MED ORDER — NEBIVOLOL HCL 2.5 MG PO TABS: 5.0000 mg | ORAL_TABLET | Freq: Every day | ORAL | Status: DC

## 2013-05-20 MED ORDER — CEFUROXIME AXETIL 500 MG PO TABS: 500.0000 mg | ORAL_TABLET | Freq: Two times a day (BID) | ORAL | Status: DC

## 2013-05-20 NOTE — Care Management Note (Addendum)
    Page 1 of 2   05/20/2013     4:32:37 PM   CARE MANAGEMENT NOTE 05/20/2013  Patient:  Lisa Sawyer, Lisa Sawyer   Account Number:  [id number]  Date Initiated:  05/20/2013  Documentation initiated by:  Lanier Clam  Subjective/Objective Assessment:   79 Y/O F ADMITTED W/MALIGNANT HTN.     Action/Plan:   FROM HOME ALONE.HAS PCP,PHARMACY.ACTIVE W/THN.   Anticipated DC Date:  05/20/2013   Anticipated DC Plan:  HOME W HOME HEALTH SERVICES      DC Planning Services  CM consult      Choice offered to / List presented to:  C-1 Patient        HH arranged  HH-1 RN  HH-2 PT  HH-3 OT  HH-4 NURSE'S AIDE      HH agency  [facility]   Status of service:  Completed, signed off Medicare Important Message given?   (If response is "NO", the following Medicare IM given date fields will be blank) Date Medicare IM given:   Date Additional Medicare IM given:    Discharge Disposition:  HOME W HOME HEALTH SERVICES  Per UR Regulation:  Reviewed for med. necessity/level of care/duration of stay  If discussed at Long Length of Stay Meetings, dates discussed:    Comments:  05/20/13 Kymoni Monday RN,BSN NCM [phone] NURSE SAID THAT PATIENT'S SISTER IN LAW HAD ALOT OF QUESTIONS ABOUT HHC,TC [facility] REP KRISTEN WHO WILL CALL INTO PATIENT'S RM @ 5P TO TALK TO FAMILY TO REASSURE ABOUT HHC,& ANSWERE QUESTIONS.NURSE UPDATED. PT/OT-HH.[facility] CHOSEN FORHHRN/PT/OT/NURSE'S AIDE.KRISTEN [facility] REP NOTIFIED OF ORDERS,& ?D/C TODAY.ACTIVE W/THN NOTIFIED REP ATIKA.EXPLAINED TO PATIENT THAT HHC IS SHORT TERM,& THN IS LONG TERM.PATIENT VOICED UNDERSTANDING.

## 2013-05-20 NOTE — Progress Notes (Signed)
Physical Therapy Treatment Patient Details Name: Lisa Sawyer MRN: [id number] DOB: [birth year] Today's Date: 05/20/2013 Time: 4098-1191 PT Time Calculation (min): 18 min  PT Assessment / Plan / Recommendation  History of Present Illness 79 y.o. female admitted with HA, confusion, UTI, HTN, hyponatremia.   PT Comments   Pt just woke from a nap requesting to go to the bathroom.  Assisted OOB to BR however pt required no physical assist.  Pt negotiated self around room/BR and hallway w/o AD with no LOB.  Pt demon good safety cognition and good self corrective response.    Follow Up Recommendations  Home health PT     Does the patient have the potential to tolerate intense rehabilitation     Barriers to Discharge        Equipment Recommendations       Recommendations for Other Services    Frequency Min 3X/week   Progress towards PT Goals Progress towards PT goals: Progressing toward goals  Plan      Precautions / Restrictions Precautions Precautions: None Restrictions Weight Bearing Restrictions: No    Pertinent Vitals/Pain No c/o pain    Mobility  Bed Mobility Bed Mobility: Supine to Sit Supine to Sit: 7: Independent Transfers Transfers: Sit to Stand;Stand to Sit Sit to Stand: 6: Modified independent (Device/Increase time);From bed;From toilet Stand to Sit: 6: Modified independent (Device/Increase time);To bed;To chair/3-in-1 Details for Transfer Assistance: good use of hands and safety cognition Ambulation/Gait Ambulation/Gait Assistance: 5: Supervision;6: Modified independent (Device/Increase time) Ambulation Distance (Feet): 255 Feet Assistive device: None Ambulation/Gait Assistance Details: amb w/o AD this session as she did prior to admission.  Good alternating gait with no LOB.  Performed dynamic staning balance activities such as side stepping and backward gait demon good balance and good self corrective responce.  Gait Pattern: Within Functional Limits      PT Goals (current goals can now be found in the care plan section)    Visit Information  Last PT Received On: 05/20/13 Assistance Needed: +1 History of Present Illness: 79 y.o. female admitted with HA, confusion, UTI, HTN, hyponatremia.    Subjective Data      Cognition       Balance     End of Session PT - End of Session Equipment Utilized During Treatment: Gait belt Activity Tolerance: Patient tolerated treatment well Patient left: in chair;with call bell/phone within reach;with chair alarm set   Felecia Shelling  PTA [facility]  Acute  Rehab Pager      [phone]

## 2013-05-20 NOTE — Evaluation (Signed)
Occupational Therapy Evaluation Patient Details Name: Lisa Sawyer MRN: [id number] DOB: 06/01/1932 Today's Date: 05/20/2013 Time: 7829-5621 OT Time Calculation (min): 29 min  OT Assessment / Plan / Recommendation History of present illness 79 y.o. female admitted with HA, confusion, UTI, HTN, hyponatremia.   Clinical Impression   Pt presents to OT with decreased I with ADL activity s/ hospitalization. Pt will benefit from skilled OT to increase I with ADL activity and return to PLOF    OT Assessment  Patient needs continued OT Services    Follow Up Recommendations  Home health OT;Supervision - Intermittent    Barriers to Discharge Decreased caregiver support    Equipment Recommendations  None recommended by OT       Frequency  Min 2X/week    Precautions / Restrictions Precautions Precautions: None Restrictions Weight Bearing Restrictions: No       ADL  Grooming: Wash/dry face;Set up Upper Body Bathing: Set up Where Assessed - Upper Body Bathing: Unsupported sitting Lower Body Bathing: Minimal assistance Where Assessed - Lower Body Bathing: Unsupported sit to stand Upper Body Dressing: Set up Where Assessed - Upper Body Dressing: Unsupported sitting Lower Body Dressing: Minimal assistance Where Assessed - Lower Body Dressing: Unsupported sit to stand Toilet Transfer: Supervision/safety Toilet Transfer Method: Sit to Barista: Regular height toilet Toileting - Clothing Manipulation and Hygiene: Min guard Where Assessed - Toileting Clothing Manipulation and Hygiene: Standing ADL Comments: Pt fees a little fuzzy today- thinks it is the zolaft she took last evening.  BP161 / 68    OT Diagnosis: Generalized weakness  OT Problem List: Decreased strength;Decreased activity tolerance OT Treatment Interventions: Self-care/ADL training;Patient/family education;DME and/or AE instruction;Energy conservation   OT Goals(Current goals can be found in  the care plan section) Acute Rehab OT Goals OT Goal Formulation: With patient Time For Goal Achievement: 06/03/13 Potential to Achieve Goals: Good ADL Goals Pt Will Perform Grooming: Independently;standing Pt Will Perform Upper Body Dressing: Independently;standing Pt Will Perform Lower Body Dressing: Independently;sit to/from stand Pt Will Transfer to Toilet: Independently;regular height toilet Pt Will Perform Toileting - Clothing Manipulation and hygiene: Independently;sit to/from stand  Visit Information  Last OT Received On: 05/20/13 Assistance Needed: +1 History of Present Illness: 79 y.o. female admitted with HA, confusion, UTI, HTN, hyponatremia.       Prior Functioning     Home Living Family/patient expects to be discharged to:: Private residence Living Arrangements: Alone Type of Home: House Home Access: Stairs to enter [employer] of Steps: 5 Entrance Stairs-Rails: Can reach both;Left;Right Home Layout: One level Home Equipment: Walker - 2 wheels;Bedside commode Additional Comments: pt denies falls at home but stated she's had "a few stumbles" Prior Function Level of Independence: Independent Communication Communication: No difficulties         Vision/Perception Vision - History Baseline Vision: Wears glasses only for reading   Cognition  Cognition Arousal/Alertness: Awake/alert Behavior During Therapy: WFL for tasks assessed/performed Overall Cognitive Status: Within Functional Limits for tasks assessed    Extremity/Trunk Assessment Cervical / Trunk Assessment Cervical / Trunk Assessment: Normal     Mobility Bed Mobility Bed Mobility: Supine to Sit Supine to Sit: 7: Independent Transfers Sit to Stand: 5: Supervision;From bed Stand to Sit: 5: Supervision;To chair/3-in-1;With upper extremity assist           End of Session OT - End of Session Activity Tolerance: Patient tolerated treatment well Patient left: in chair;with call  bell/phone within reach  GO  Alba Cory 05/20/2013, 9:54 AM

## 2013-05-20 NOTE — Discharge Summary (Signed)
Physician Discharge Summary  KAMYIA THOMASON GNF:[id number] DOB: Oct 25, 1931 DOA: 05/16/2013  PCP: Dow Adolph, MD  Admit date: 05/16/2013 Discharge date: 05/20/2013  Time spent: >30 minutes  Recommendations for Outpatient Follow-up:  Follow-up Information   Follow up with [facility], MD. (in 1week, call for appt upon discharge)    Specialty:  Family Medicine   Contact information:   [address] [place] [address] [phone]        Discharge Diagnoses:  Active Problems:   HYPOTHYROIDISM   Depression with anxiety   HTN (hypertension), malignant   Headache   Hyponatremia   Discharge Condition: improved/stable  Diet recommendation: Heart healthy  Filed Weights   05/17/13 0400 05/18/13 0400 05/19/13 1427  Weight: 60.9 kg (134 lb 4.2 oz) 60.4 kg (133 lb 2.5 oz) 62.4 kg (137 lb 9.1 oz)    History of present illness:  Lisa Sawyer is a 79 y.o. female history of hypertension, hypothyroidism, chronic anemia who presents with above complaints. She states that this morning when she woke up she had a frontal headache-felt like a pressure. She also reports that she felt confused as well-not able to find things that she usually would have no trouble locating and so she decided to come to the ED. She denies chest pain, leg swelling, PND and no orthopnea. She reports initially she felt a little short of breath but after she calmed down she did not have any further shortness of breath. She was seen in the ED and blood pressure noted to be markedly elevated-initially 247/67 and seemed to improve after a total of 60 of IV labetalol, but on recheck was up in the 200s again. Patient states that she had seen Dr. Elease Hashimoto recently and Bystolic was prescribed but she had not started taking it yet. Head CT, troponin done in ED were negative also chest x-ray with no acute infiltrates, and EKG with mild bradycardia at 58 and no acute ischemic changes, multiple PACs. She denies  dizziness and no focal weakness. She is admitted for further evaluation and management.      Hospital Course:  HTN (hypertension), malignant/hypertensive encephalopathy  -As discussed above, upon admission Pt was placed on Cardene drip  -blood pressure control improved and she was weaned off the Cardene drip 9/19, after po meds were resumed. On follow up she developed uncontrolled blood pressures p.m. Of 9/20 requiring when necessary hydralazine  -she complained of dizziness on 9/19 and was found to be orthostatic >> IV fluids increased, on follow recheck orthostatics on 9/20 wnl and IVF decreased to [facility]  -Her Bystolic dose was increased , and she was maintained on lisinopril and  when necessary hydralazine- her blood pressures were further monitor in step down and remained stable and she was transferred to telemetry  -BP control improved -Patient educated 9/19 on importance of compliance with medications or requesting medication change with outpatient MDs if she has concerns or questions. -PT OT was consulted and recommended HH services HYPOTHYROIDISM  -Continue Synthroid  Depression with anxiety  -Continue outpatient meds  Headache  -Secondary to #1, resolved with treatment as above and Pain managemen -CT scan of head negative for acute findings  Hyponatremia  -Likely secondary to volume depletion  -improved with hydration. GERD/dyspepsia  -continue Pepcid(she states she was on a PPI previously but stopped it because of concern about her bones)  UTI, E. Coli -Urinalysis consistent with UTI, agree with empiric antibiotics-change to Rocephin>> urine culture grew e coli sensitive to cephalosporins and she was  dc'ed on ceftin -likely etiology for intermittent confusion/acute toxic encephalopathy noted while in the hospital      Procedures:  none  Consultations:  none  Discharge Exam: Filed Vitals:   05/20/13 1320  BP: 161/58  Pulse: 85  Temp: 97.5 F (36.4 C)  Resp: 22    exam:  General: alert & oriented x 3 In NAD  Cardiovascular: RRR, nl S1 s2  Respiratory: CTAB  Abdomen: soft +BS NT/ND, no masses palpable  Extremities: No cyanosis and no edema   Discharge Instructions  Discharge Orders   Future Appointments Provider Department Dept Phone   06/13/2013 1:00 PM Maurice March, MD [facility] [phone]   07/03/2013 11:30 AM Rachael Fee [facility] [phone]   07/03/2013 12:00 PM Josph Macho, MD [facility] [phone]   07/17/2013 10:45 AM Maurice March, MD [facility] [phone]   Future Orders Complete By Expires   Diet - low sodium heart healthy  As directed    Increase activity slowly  As directed        Medication List    STOP taking these medications       metoprolol succinate 25 MG 24 hr tablet  Commonly known as:  TOPROL-XL      TAKE these medications       ALPRAZolam 0.5 MG tablet  Commonly known as:  XANAX  Take 0.25-0.5 mg by mouth 2 (two) times daily as needed for anxiety.     aspirin EC 81 MG tablet  Take 81 mg by mouth at bedtime.     calcium-vitamin D 500-200 MG-UNIT per tablet  Commonly known as:  OSCAL WITH D  Take 1 tablet by mouth daily.     cefUROXime 500 MG tablet  Commonly known as:  CEFTIN  Take 1 tablet (500 mg total) by mouth 2 (two) times daily.     cholecalciferol 1000 UNITS tablet  Commonly known as:  VITAMIN D  Take 1,000 Units by mouth daily.     famotidine 20 MG tablet  Commonly known as:  PEPCID  Take 1 tablet (20 mg total) by mouth 2 (two) times daily.     fish oil-omega-3 fatty acids 1000 MG capsule  Take 1 g by mouth daily.     levothyroxine 100 MCG tablet  Commonly known as:  SYNTHROID, LEVOTHROID  Take 1 tablet (100 mcg total) by mouth daily before breakfast.     lisinopril 40 MG tablet  Commonly known as:  PRINIVIL,ZESTRIL  Take 1 tablet (40 mg total) by mouth daily.      loratadine 10 MG tablet  Commonly known as:  CLARITIN  take 1 tablet by mouth once daily     multivitamin with minerals Tabs tablet  Take 1 tablet by mouth daily.     NASONEX 50 MCG/ACT nasal spray  Generic drug:  mometasone  instill 2 sprays into each nostril once daily     nebivolol 2.5 MG tablet  Commonly known as:  BYSTOLIC  Take 2 tablets (5 mg total) by mouth daily.     sertraline 25 MG tablet  Commonly known as:  ZOLOFT  Take 25 mg by mouth daily.       Allergies  Allergen Reactions  . Naprosyn [Naproxen] Itching  . Zoloft [Sertraline Hcl]     Confusion, blurred vision       Follow-up Information   Follow up with [facility], MD. (  in 1week, call for appt upon discharge)    Specialty:  Family Medicine   Contact information:   [address] [place] [address] [phone]        The results of significant diagnostics from this hospitalization (including imaging, microbiology, ancillary and laboratory) are listed below for reference.    Significant Diagnostic Studies: Dg Chest 2 View  05/16/2013   *RADIOLOGY REPORT*  Clinical Data: Hypertension, headaches  CHEST - 2 VIEW  Comparison: 04/08/2012  Findings: The cardiac shadow is stable.  The lungs are clear bilaterally.  No focal infiltrate or effusion is seen.  No acute bony abnormality is noted.  IMPRESSION: No acute abnormality noted.   Original Report Authenticated By: Alcide Clever, M.D.   Ct Head Wo Contrast  05/16/2013   CLINICAL DATA:  Headache and confusion  EXAM: CT HEAD WITHOUT CONTRAST  TECHNIQUE: Contiguous axial images were obtained from the base of the skull through the vertex without intravenous contrast.  COMPARISON:  CT 04/08/2012  FINDINGS: Mild atrophy. Chronic microvascular ischemic change in the Arrazola matter and internal capsule bilaterally. Hypodensity left basal ganglia unchanged and most likely a benign cyst.  Negative for acute infarct, hemorrhage, or mass lesion. No acute  skull abnormality.  IMPRESSION: Atrophy and chronic microvascular ischemia. No acute abnormality.   Electronically Signed   By: Marlan Palau M.D.   On: 05/16/2013 12:15    Microbiology: Recent Results (from the past 240 hour(s))  MRSA PCR SCREENING     Status: None   Collection Time    05/16/13  7:07 PM      Result Value Range Status   MRSA by PCR NEGATIVE  NEGATIVE Final   Comment:            The GeneXpert MRSA Assay (FDA     approved for NASAL specimens     only), is one component of a     comprehensive MRSA colonization     surveillance program. It is not     intended to diagnose MRSA     infection nor to guide or     monitor treatment for     MRSA infections.  URINE CULTURE     Status: None   Collection Time    05/19/13  1:10 AM      Result Value Range Status   Specimen Description URINE, CLEAN CATCH   Final   Special Requests NONE   Final   Culture  Setup Time     Final   Value: 05/19/2013 21:26     Performed at [facility] Count     Final   Value: >=100,000 COLONIES/ML     Performed at [facility]   Culture     Final   Value: ESCHERICHIA COLI     Performed at [facility]   Report Status PENDING   Incomplete     Labs: Basic Metabolic Panel:  Recent Labs Lab 05/17/13 0130 05/18/13 0400 05/18/13 2336 05/19/13 0350 05/20/13 0415  NA 133* 135 128* 130* 133*  K 3.9 4.2 4.4 4.2 3.9  CL 94* 99 97 97 97  CO2 30 26 27 28 27   GLUCOSE 134* 152* 106* 96 103*  BUN 19 15 19 18 18   CREATININE 0.77 0.80 1.24* 1.11* 1.04  CALCIUM 10.2 10.3 9.5 9.7 9.9   Liver Function Tests:  Recent Labs Lab 05/18/13 2336  AST 20  ALT 22  ALKPHOS 54  BILITOT 0.2*  PROT 6.0  ALBUMIN 2.9*   No results found for this basename: LIPASE, AMYLASE,  in the last 168 hours No results found for this basename: AMMONIA,  in the last 168 hours CBC:  Recent Labs Lab 05/16/13 2007 05/17/13 0130  WBC 5.5 6.0  HGB 11.8* 12.8  HCT 36.0 38.7  MCV  94.5 93.9  PLT 261 278   Cardiac Enzymes:  Recent Labs Lab 05/16/13 1130 05/16/13 2007 05/17/13 0130 05/17/13 0700  TROPONINI <0.30 <0.30 <0.30 <0.30   BNP: BNP (last 3 results) No results found for this basename: PROBNP,  in the last 8760 hours CBG:  Recent Labs Lab 05/16/13 1816  GLUCAP 106*       Signed:  Laketha Leopard C  [facility] 05/20/2013, 3:09 PM

## 2013-05-20 NOTE — Telephone Encounter (Signed)
Dr. Audria Nine,  Son called and asks if you will call Dr. Seth Bake"  [place] to discuss plan of action for patients release from [facility].    Dr. Seth Bake pager is [phone]

## 2013-05-20 NOTE — Progress Notes (Signed)
Pt states that she feel funny this morning. Pt thinks it is due to her zoloft given last night

## 2013-05-21 LAB — URINE CULTURE

## 2013-05-21 NOTE — Telephone Encounter (Signed)
I read the discharge planning note; pt will receive Home Health Services through [facility] Pt was evaluated in the home earlier this month so they are familiar w/ this individual. The plan is to re-eval in the home and then decide what services need to be provided. The family will be kept informed.

## 2013-05-23 NOTE — Telephone Encounter (Signed)
Thank you I have called her son, Lisa Sawyer there is a nurse there now and Monday home care came in also. He states currently she is stable and he will let us know if anything else is needed. She is receiving good care at home, and he is pleased with how well she has done in last couple days. To you FYI She will follow up with you on Oct 1.

## 2013-05-23 NOTE — Telephone Encounter (Signed)
I will contact the Kathlene November (either by phone call or via e-mail) after I see pt on Oct 1st.

## 2013-05-24 NOTE — Telephone Encounter (Signed)
Thank you :)

## 2013-05-28 ENCOUNTER — Telehealth: Payer: Self-pay | Admitting: Radiology

## 2013-05-28 ENCOUNTER — Telehealth: Payer: Self-pay

## 2013-05-28 MED ORDER — NEBIVOLOL HCL 2.5 MG PO TABS: 2.5000 mg | ORAL_TABLET | Freq: Every day | ORAL | Status: DC

## 2013-05-28 NOTE — Telephone Encounter (Signed)
The bradycardia may be due to the addition of bystolic.  If she is asymptomatic, I would continue for now.  We will address at next office visit.

## 2013-05-28 NOTE — Telephone Encounter (Signed)
I have asked for a recheck on the pulse rate, it is 48, this is concerning. Discussed with Dr Audria Nine, and she would like Dr Melburn Popper, the cardiologist to advise.  She states she feels fine. She has no dizziness. Called his office, have sent this message to his nurse Antony Odea. Please advise.

## 2013-05-28 NOTE — Telephone Encounter (Signed)
Pt home health physical therapist called today. Wanted [place] to know that pt was recently hospitalized for htn and today her BP was 170/68 with a pulse of 48, she is otherwise asymptomatic.  Physical therapist is [facility] [phone], would like a call back.  Please note that Dr. Audria Nine is not here today or tomorrow due to illness.

## 2013-05-28 NOTE — Telephone Encounter (Signed)
Patient was contacted by Cardiology today in reference to her BP and her heart rate. Home health concerned, patient did not get the prescription that was sent in for her UTI on 05/20/13, for Ceftin. Advised it was a hospital prescription and I can not resend to pharmacy. Please advise. Pharmacy indicating they did not get the prescription,  this message did not route on 9/30, unsure what happened. To you now, I am sorry.

## 2013-05-28 NOTE — Telephone Encounter (Signed)
PT/ Akiko was called and informed. Pt was called and she states she feels fine, denies dizziness. Bystolic was reviewed and pt is taking 2.5 mg daily. Pt reviewed her readings with me and are as follows; 119/67 p70 136/66 p57 146/50 p58 114/63 p55 Pt was told to continue current dose/ told to continue to document readings and to call with any symptoms/ questions or concerns.  Pt verbalized understanding and agreed to plan.

## 2013-05-28 NOTE — Telephone Encounter (Signed)
Patient was contacted by Cardiology today in reference to her BP and her heart rate. Home health concerned, patient did not get the prescription that was sent in for her UTI on 05/20/13.

## 2013-05-29 ENCOUNTER — Ambulatory Visit: Payer: PRIVATE HEALTH INSURANCE | Admitting: Family Medicine

## 2013-05-31 MED ORDER — CEFUROXIME AXETIL 500 MG PO TABS: 500.0000 mg | ORAL_TABLET | Freq: Two times a day (BID) | ORAL | Status: DC

## 2013-05-31 NOTE — Telephone Encounter (Signed)
I phoned the antibiotic (Cefuroxime 500 mg  #14) to [facility] and spoke w/ the pharmacist. I will call pt to inform her that she needs to get this medication and take it for 7 days in addition to other measure to help clear a UTI.  Spoke w/ pt; she states she did not receive RX for antibiotic at discharge form hospital. I advised she p/u RX from pharmacy tomorrow. She cannot get it tonight; she is not driving. She feels fine and has no UTI symptoms. Appetite is good. Has appt 06/04/13.

## 2013-06-04 ENCOUNTER — Encounter: Payer: Self-pay | Admitting: Family Medicine

## 2013-06-04 ENCOUNTER — Ambulatory Visit (INDEPENDENT_AMBULATORY_CARE_PROVIDER_SITE_OTHER): Payer: PRIVATE HEALTH INSURANCE | Admitting: Family Medicine

## 2013-06-04 VITALS — BP 218/66 | HR 46 | Temp 99.1°F | Resp 16 | Ht 61.5 in | Wt 141.8 lb

## 2013-06-04 DIAGNOSIS — I10 Essential (primary) hypertension: Secondary | ICD-10-CM

## 2013-06-04 DIAGNOSIS — I498 Other specified cardiac arrhythmias: Secondary | ICD-10-CM

## 2013-06-04 DIAGNOSIS — F341 Dysthymic disorder: Secondary | ICD-10-CM

## 2013-06-04 DIAGNOSIS — R001 Bradycardia, unspecified: Secondary | ICD-10-CM

## 2013-06-04 DIAGNOSIS — R1013 Epigastric pain: Secondary | ICD-10-CM

## 2013-06-04 DIAGNOSIS — K3189 Other diseases of stomach and duodenum: Secondary | ICD-10-CM

## 2013-06-04 DIAGNOSIS — F418 Other specified anxiety disorders: Secondary | ICD-10-CM

## 2013-06-04 MED ORDER — SERTRALINE HCL 25 MG PO TABS: ORAL_TABLET | ORAL | Status: DC

## 2013-06-04 MED ORDER — FAMOTIDINE 20 MG PO TABS: 20.0000 mg | ORAL_TABLET | Freq: Two times a day (BID) | ORAL | Status: DC

## 2013-06-04 NOTE — Patient Instructions (Signed)
I have diagramed for you when to take your medications; this shows you how to put the medications in your pill planner.  I will see you again on Jun 18, 2013 instead of Oct 16th.

## 2013-06-05 ENCOUNTER — Telehealth: Payer: Self-pay | Admitting: *Deleted

## 2013-06-05 NOTE — Telephone Encounter (Signed)
Faxed signed documentation encounter to [facility] attention to Edwena Bunde, per Dr Audria Nine. Confirmation page  received at 2:47 pm

## 2013-06-05 NOTE — Progress Notes (Signed)
S:  This 79 y. o. AA female is here for post-hospitalization follow-up. She is here w/ her sister-in-law who is a retired Engineer, civil (consulting). She has malignant HTN which is also monitored and managed by Dr. Melburn Popper. Medication change in the hospital included increase Bystolic with continued Lisinopril.  BP at discharge= 161/58. Since discharge, BP has been intermittently elevated with bradycardia. Dr. Melburn Popper is aware of this and decreased Bystolic dose back to once a day. Pt reports better BP readings at home (150-160/60-80). She denies CP or tightness, palpitations, HA, dizziness, numbness, weakness or syncope. Pt reports that she weighs daily and thinks she needs to be concerned about CHF because of ankle edema. She is sedentary throughout the day and reports ankle edema subsides overnight.  Pt was diagnosed w/ UTI and antibiotic prescribed by discharging physician did not get transmitted to pt's pharmacy. This med was phoned to pharmacy by this provider on 05/31/13; pt is currently taking this medication.  Pt was also prescribed Pepcid with the same mishap. She has not been on this medication and reports epigastric discomfort and "hunger pangs" which she addresses by eating frequently . She is concerned about overeating and weight gain. Last meal of day is usually 8 pm; pt awakens at 3 am w/ hunger and takes Levothyroxine at that time because she thinks it needs to be taken that far apart from other morning meds. She then awakens at 7-8 am, feeling anxious about when to take other meds and getting her first meal of the day. She has daily visits from home health aides and twice-a-week nursing visits.  Pt acknowledges that she has a lot of anxiety at the beginning of the day; this subsides as day progresses. She is taking Sertraline 25 mg 1/2 tablet at bedtime and is hoping to discontinue this medication once she completes current supply. Her friend endorsed chronic anxiety in the pt. Alprazolam is used prn, maybe 2-3 times  a week at bedtime.  Home Health nurse helps pt feel pill planner on a weekly basis. Pt  Communicates w/ son, Kathlene November, daily.   PMHx, Soc Hx and Problem List reviewed. Recent Hospital Discharge Diagnoses reviewed w/ pt.  MEDICATIONS reconciled.  ROS: As per HPI.  O: Filed Vitals:   06/04/13 1327  BP: 218/66                                     BP recheck: 190/60  Pulse: 46  Temp: 99.1 F (37.3 C)  Resp: 16   GEN: In NAD; WN,WD. HENT: [place]/AT; EOMI w/ clear conj and muddy sclerae. Oroph moist w/ fair dentition. COR: RRR. Normal S1 and S2 w/o extrasystoles. No m/g/r. LUNGS: CTA; normal resp rate and effort. MS: Trace ankle edema noted. NEURO: A&O x 3; CNs intact. Nonfocal. PSYCH: Anxious but otherwise mood is appropriate; inattentive at times, requiring repetition of information. Speech pattern and thought content is normal. No significant cognitive impairment at this visit. Judgement is good.  A/P: HYPERTENSION, ESSENTIAL NOS- Continue current medications and monitor BP daily when in a relaxed state.  Depression with anxiety- Continue Sertraline 25 mg 1/2 tablet every morning. May use Alprazolam prn.  Dyspepsia- RX: Famotidine 20 mg 1 tab bid. Eat regular meals and snacks.  Bradycardia, sinus- Monitor. Contact clinic for increased lightheadedness or dizziness, weakness or near-syncope.  Meds ordered this encounter  Medications  . famotidine (PEPCID) 20 MG tablet  Sig: Take 1 tablet (20 mg total) by mouth 2 (two) times daily.    Dispense:  60 tablet    Refill:  2  . sertraline (ZOLOFT) 25 MG tablet    Sig: Take 1/2 tablet by mouth every morning or as directed.    Dispense:  30 tablet    Refill:  3    I diagramed on paper a plan for medication administration; it serves as a guide for pill placement in the pill planner.

## 2013-06-07 ENCOUNTER — Telehealth: Payer: Self-pay | Admitting: Family Medicine

## 2013-06-07 NOTE — Telephone Encounter (Signed)
Pt reported at last visit that she always gets anxious and a little agitated in the mornings, especially in anticipation of nurse or aide visit. Please ask nurse to allow pt to calm down and settle into visit and recheck BP ~ 15 -20 minutes after initial reading.

## 2013-06-07 NOTE — Telephone Encounter (Signed)
Aide notified. She understood

## 2013-06-07 NOTE — Telephone Encounter (Signed)
Akikl from [facility] called concerning pt's BP. It was 208/80 this morning. Pt is not having any symptoms. Please advise.

## 2013-06-13 ENCOUNTER — Ambulatory Visit: Payer: PRIVATE HEALTH INSURANCE | Admitting: Family Medicine

## 2013-06-18 ENCOUNTER — Encounter: Payer: Self-pay | Admitting: Family Medicine

## 2013-06-18 ENCOUNTER — Ambulatory Visit (INDEPENDENT_AMBULATORY_CARE_PROVIDER_SITE_OTHER): Payer: PRIVATE HEALTH INSURANCE | Admitting: Family Medicine

## 2013-06-18 VITALS — BP 188/78 | HR 55 | Temp 98.1°F | Resp 16 | Ht 62.0 in | Wt 139.0 lb

## 2013-06-18 DIAGNOSIS — Z23 Encounter for immunization: Secondary | ICD-10-CM

## 2013-06-18 DIAGNOSIS — F418 Other specified anxiety disorders: Secondary | ICD-10-CM

## 2013-06-18 DIAGNOSIS — F341 Dysthymic disorder: Secondary | ICD-10-CM

## 2013-06-18 DIAGNOSIS — I10 Essential (primary) hypertension: Secondary | ICD-10-CM

## 2013-06-18 NOTE — Patient Instructions (Signed)
Continue all current medications. Check your blood pressure at home when you are calm and relaxed. If you are planning to travel, be sure to keep your prescription medications close at hand. Do not check them in your baggage if you plan to fly. You may want to take Xanax (alprazolam) 1/2 tablet prior to boarding the plane so that you can relax during your flight.

## 2013-06-18 NOTE — Progress Notes (Signed)
S:  This 79 y.o. AA female has malignant HTN, with Budzinski-coat HTN. She reports home BP= 150-160/ 80-90. She is doing better w/ medication administration but has some anxiety about taking too many medications together. Mornings are still anxiety-provoking. Pt attended church on Sunday (2 days ago) but was fatigued yesterday, so she found herself sleeping a lot. She is planning to go visit her son in [place] for Thanksgiving; family will either go by car or fly. She wants advise about managing her medications for travel. Home Health is still providing services. One of the nurses advised her about nutrition and avoiding hunger and headaches throughout the day. Pepcid has reduced some GI symptoms. She is still concerned about weight gain; she has gained 9 pounds since mid-August.  PMHx, Soc Hx and Problem List reviewed. Medications reconciled.  ROS: As per HPI. Negative for diaphoresis, vision changes, CP or tightness, palpitations, SOB or DOE, cough, HA, dizziness, numbness or syncope. Sleep pattern is improved. Pt has no thoughts of self harm.  O: Filed Vitals:   06/18/13 1502  BP: 188/78  Pulse:   Temp:   Resp:    See first set of vitals: BP 209/66, HR 55.  GEN: In NAD; WN,WD. HENT: [place]/AT; EOMI w/ clear conj/sclerae. Otherwise unchanged. NECK: Supple w/o LAN or TMG. COR: Regular rate (bradycardic) and rhythm. No m/g/r. LUNGS: CTA. No rhonchi or rales. SKIN: W&D; intact w/o erythema or rashes. NEURO: A&O x 3; CNs intact. Nonfocal. PSYCH: Pleasant and attentive, conversant. Speech pattern and thought content normal. Judgement sound.  A/P: HYPERTENSION, ESSENTIAL NOS- Stable. No medication changes; reassurance.  Depression with anxiety- Stable; continue Sertraline and Alprazolam prn. Travel advise given.  Need for prophylactic vaccination and inoculation against influenza - Plan: Flu Vaccine QUAD 36+ mos IM

## 2013-06-26 ENCOUNTER — Ambulatory Visit: Payer: PRIVATE HEALTH INSURANCE | Admitting: Hematology & Oncology

## 2013-06-26 ENCOUNTER — Other Ambulatory Visit: Payer: PRIVATE HEALTH INSURANCE | Admitting: Lab

## 2013-07-03 ENCOUNTER — Other Ambulatory Visit: Payer: PRIVATE HEALTH INSURANCE | Admitting: Lab

## 2013-07-03 ENCOUNTER — Ambulatory Visit: Payer: PRIVATE HEALTH INSURANCE | Admitting: Hematology & Oncology

## 2013-07-03 ENCOUNTER — Telehealth: Payer: Self-pay

## 2013-07-03 NOTE — Telephone Encounter (Signed)
Patient states that she is having a reaction to her medication. Believes it may be drug interaction with her Zoloft and Xanax.   [phone]

## 2013-07-03 NOTE — Telephone Encounter (Signed)
Called her, she states she feels bad after she takes her medications. She takes both of them if she needs them. She indicates she takes them sometimes in the mornings. I have told her to take the Zoloft every morning and only use the Xanax as needed. She indicates she will do this. She wants to know if we know anyone who can fill up her pill box for her, because she gets very confused about what she is taking. She wants [place] to do this here. I advised her we can not do this here. Please advise. She indicates someone is supposed to do this for her, but it is unclear if this is being done, or if she gets confused about how to take them once they are in the box. This conversation concerns me, patient seems confused about meds and pill box. Do you know what agency takes care of her? Does she need a safety eval?

## 2013-07-04 NOTE — Telephone Encounter (Signed)
Patients son is calling us to talk to nurse or md about his mothers medications if possible please call him at [phone]

## 2013-07-04 NOTE — Telephone Encounter (Addendum)
Called him. Spoke to Steele. He states she is confused, told him I was concerned about this after our conversation yesterday. He states the way her meds are written are confusing. He wants a schedule of her meds. I have printed a copy of this will you write down times she is to take these medications. He wants me to mail to him.  [address] [place] [address] [place] [address] He is advised also a safety eval or a medication review by one of the nurses may be helpful, if you agree. Please advise. I will call him back.

## 2013-07-09 ENCOUNTER — Telehealth: Payer: Self-pay

## 2013-07-09 NOTE — Telephone Encounter (Addendum)
I called the pt earlier this evening- no answer. I have given pt a detailed illustration (like a pill box) that shows her when she was to take her medications. I am not sure if she used it or still has it. I will try to contact pt again tomorrow; The simplest way to address pt's medications is to discontinue vitamins and unnecessary supplements so that she is taking only prescription medications that are necessary. I will get the med list back to you tomorrow. Home Health was visiting pt regularly after hospitalization in September. Wynonia Lawman, RN of [facility] saw pt initially in Sept and was following pt (that was my understanding). Contact her @ [phone] to get some support in the care of Lisa Sawyer. I am not sure what home health nurse was checking on pt; pt's sister-in-law accompanied her to last visit. She may have a good idea about whether or not HHN is still visiting pt.

## 2013-07-09 NOTE — Telephone Encounter (Signed)
Spoke with patient she is very confused.  She took her afternoon meds this am and wanted to make sure it was okay to take her am meds. The meds she took this am was all vitamins.  Advised patient it should be fine to take am meds this afternoon.  Patient states understanding.

## 2013-07-10 NOTE — Telephone Encounter (Signed)
Thanks for letting me know. I will advise you when I speak to the nurse tomorrow.

## 2013-07-10 NOTE — Telephone Encounter (Signed)
Thank you. I have called the nurse, she is being taken care of by [facility] also with [facility], Elnita Maxwell with [facility] will check on her at home tomorrow and let us know what her recommendations are for patient. Advised her we can d/c the vitamins and unnecessary medications/ supplements if she thinks this will be helpful by decreasing her anxiety. Nurse will call back tomorrow with the recommendations.

## 2013-07-10 NOTE — Telephone Encounter (Signed)
I called Lisa Sawyer, pt's son, and spoke w/ him about HHN visit and care team management visit to help address cape plan. Pt was supposed to travel at Thanksgiving to [place] but changed her mind. She wanted Lisa Sawyer to fly with her or drive her. Plan now if for pt to remain at home for holiday. Lisa Sawyer can no longer help her with filling her pill box because pt seems to get confused; changing to a pill box that has 4 compartments/day has confused pt. We discussed discontinuing unnecessary medications (allergy meds and multivitamin). I advised Lisa Sawyer that pt may have early dementia and reviewed results of head CT performed in Sept 2014. This showed atrophy and microvascular ischemia related to chronic HTN. I encouraged son to be thinking ahead about long term plans for care of pt; he states the family has had a discussion about this and it is ongoing.

## 2013-07-17 ENCOUNTER — Encounter: Payer: PRIVATE HEALTH INSURANCE | Admitting: Family Medicine

## 2013-07-18 ENCOUNTER — Telehealth: Payer: Self-pay | Admitting: Hematology & Oncology

## 2013-07-18 ENCOUNTER — Telehealth: Payer: Self-pay | Admitting: *Deleted

## 2013-07-18 NOTE — Telephone Encounter (Signed)
Pt called to schedule appointment from 11-5. She is going out of town after next week and needs to see the MD. I transferred to RN to triage

## 2013-07-19 ENCOUNTER — Ambulatory Visit: Payer: PRIVATE HEALTH INSURANCE | Admitting: Family Medicine

## 2013-07-24 ENCOUNTER — Telehealth: Payer: Self-pay | Admitting: Family Medicine

## 2013-07-24 ENCOUNTER — Other Ambulatory Visit: Payer: Self-pay | Admitting: Family Medicine

## 2013-07-24 MED ORDER — LEVOTHYROXINE SODIUM 100 MCG PO TABS: 100.0000 ug | ORAL_TABLET | Freq: Every day | ORAL | Status: DC

## 2013-07-24 MED ORDER — MOMETASONE FUROATE 50 MCG/ACT NA SUSP: NASAL | Status: AC

## 2013-07-24 MED ORDER — ALPRAZOLAM 0.5 MG PO TABS: 0.2500 mg | ORAL_TABLET | Freq: Two times a day (BID) | ORAL | Status: DC | PRN

## 2013-07-24 MED ORDER — NEBIVOLOL HCL 2.5 MG PO TABS: 2.5000 mg | ORAL_TABLET | Freq: Every day | ORAL | Status: DC

## 2013-07-24 MED ORDER — SERTRALINE HCL 25 MG PO TABS: ORAL_TABLET | ORAL | Status: DC

## 2013-07-24 MED ORDER — LISINOPRIL 40 MG PO TABS: 40.0000 mg | ORAL_TABLET | Freq: Every day | ORAL | Status: DC

## 2013-07-24 MED ORDER — FAMOTIDINE 20 MG PO TABS: 20.0000 mg | ORAL_TABLET | Freq: Two times a day (BID) | ORAL | Status: DC

## 2013-07-24 NOTE — Telephone Encounter (Signed)
I spoke w/ Riley Lam, pt's son, about upcoming move to [place]. Pt has visited there in the past and has spoken of moving there permanently. Riley Lam decided to move pt to his home after receiving multiple phone calls from concerned family members here in [place]. He will get her into a medical practice soon after she moves there; I encouraged he find a physician who has a lot of experience with geriatric individuals. I have printed 90-day supply of all chronic medications. He picked up the RXs this afternoon.

## 2013-07-24 NOTE — Telephone Encounter (Signed)
Elnita Maxwell the nurse for patient called to advise Dr Audria Nine the patient will be living with son Riley Lam) in [place]. She will leave Friday and needs a refill on all medications for 90 days until she is established with a PCP there. I advised Dr Audria Nine and she will refill medications. I called Elnita Maxwell and told her to let the son know to pick up prescriptions at 104 appointment before 5 pm today or 102 walk in clinic before 8:30 pm.

## 2013-08-01 ENCOUNTER — Other Ambulatory Visit: Payer: PRIVATE HEALTH INSURANCE | Admitting: Lab

## 2013-08-01 ENCOUNTER — Ambulatory Visit: Payer: Self-pay | Admitting: Hematology & Oncology

## 2013-08-07 ENCOUNTER — Encounter: Payer: PRIVATE HEALTH INSURANCE | Admitting: Family Medicine

## 2013-08-16 ENCOUNTER — Telehealth: Payer: Self-pay

## 2013-08-16 NOTE — Telephone Encounter (Signed)
[employer] will not cover nasonex in 2015. Do you want to chg to covered alt: fluticazone, azelastine patanase, levocetirizine?

## 2013-08-16 NOTE — Telephone Encounter (Signed)
This patient has moved to [place] with one of her sons. I think the best plan is to let her new PCP prescribe the nasal spray if needed in 2015.

## 2013-08-20 NOTE — Telephone Encounter (Signed)
LMOM for pt advising her of coverage change and that she contact new provider in [place] to change Rx.

## 2013-08-26 ENCOUNTER — Telehealth: Payer: Self-pay | Admitting: Radiology

## 2013-08-26 ENCOUNTER — Other Ambulatory Visit: Payer: Self-pay | Admitting: Radiology

## 2013-08-26 MED ORDER — NEBIVOLOL HCL 2.5 MG PO TABS: 2.5000 mg | ORAL_TABLET | Freq: Every day | ORAL | Status: DC

## 2013-08-26 NOTE — Telephone Encounter (Signed)
Sent Bystolic for her, pharmacy called.

## 2013-08-28 NOTE — Telephone Encounter (Signed)
Spoke to daughter who will get the message to pt. Also faxed back to [facility] to notify pt or new provider in [facility]

## 2013-09-02 ENCOUNTER — Telehealth: Payer: Self-pay | Admitting: Cardiovascular Disease

## 2013-09-02 NOTE — Telephone Encounter (Signed)
Pt has moved to [place] to be with her son.  Discussed bystolic/ per last AVR pt to check bp and call with questions. Son verbalized understanding. They will establish with a pcp soon.

## 2013-09-02 NOTE — Telephone Encounter (Signed)
New Problem:  Pt's son, Laurey Arrow, states his mom has moved to [place] and he would like a call back to discuss his mom's plan of care until she gets set up with another cardiologist.

## 2013-10-04 ENCOUNTER — Encounter: Payer: Self-pay | Admitting: Internal Medicine

## 2013-11-13 ENCOUNTER — Telehealth: Payer: Self-pay | Admitting: Hematology & Oncology

## 2013-11-13 NOTE — Telephone Encounter (Signed)
Faxed Medical Records via fax today  to:   [facility] [address] [place] [address]  Phone [phone] Fax [phone]  Medical  Records requested from 07/2003 to present   [place]

## 2014-06-12 ENCOUNTER — Encounter: Payer: Self-pay | Admitting: Internal Medicine

## 2016-06-03 ENCOUNTER — Encounter (HOSPITAL_COMMUNITY): Payer: Self-pay | Admitting: Vascular Surgery

## 2016-06-03 ENCOUNTER — Emergency Department (HOSPITAL_COMMUNITY)
Admission: EM | Admit: 2016-06-03 | Discharge: 2016-06-04 | Disposition: A | Discharge status: Home / Self Care | Payer: Medicare Other | Attending: Emergency Medicine | Admitting: Emergency Medicine

## 2016-06-03 DIAGNOSIS — Z7982 Long term (current) use of aspirin: Secondary | ICD-10-CM | POA: Insufficient documentation

## 2016-06-03 DIAGNOSIS — N289 Disorder of kidney and ureter, unspecified: Secondary | ICD-10-CM | POA: Diagnosis not present

## 2016-06-03 DIAGNOSIS — Z87891 Personal history of nicotine dependence: Secondary | ICD-10-CM | POA: Insufficient documentation

## 2016-06-03 DIAGNOSIS — E039 Hypothyroidism, unspecified: Secondary | ICD-10-CM | POA: Insufficient documentation

## 2016-06-03 DIAGNOSIS — R42 Dizziness and giddiness: Secondary | ICD-10-CM

## 2016-06-03 DIAGNOSIS — I10 Essential (primary) hypertension: Secondary | ICD-10-CM | POA: Insufficient documentation

## 2016-06-03 DIAGNOSIS — Z853 Personal history of malignant neoplasm of breast: Secondary | ICD-10-CM | POA: Diagnosis not present

## 2016-06-03 LAB — BASIC METABOLIC PANEL
ANION GAP: 13 (ref 5–15)
BUN: 43 mg/dL — ABNORMAL HIGH (ref 6–20)
CALCIUM: 10.1 mg/dL (ref 8.9–10.3)
CO2: 29 mmol/L (ref 22–32)
Chloride: 99 mmol/L — ABNORMAL LOW (ref 101–111)
Creatinine, Ser: 1.51 mg/dL — ABNORMAL HIGH (ref 0.44–1.00)
GFR, EST AFRICAN AMERICAN: 36 mL/min — AB (ref >60)
GFR, EST NON AFRICAN AMERICAN: 31 mL/min — AB (ref >60)
Glucose, Bld: 107 mg/dL — ABNORMAL HIGH (ref 65–99)
Potassium: 4.2 mmol/L (ref 3.5–5.1)
SODIUM: 141 mmol/L (ref 135–145)

## 2016-06-03 LAB — CBC
HCT: 34.6 % — ABNORMAL LOW (ref 36.0–46.0)
Hemoglobin: 10.8 g/dL — ABNORMAL LOW (ref 12.0–15.0)
MCH: 31.2 pg (ref 26.0–34.0)
MCHC: 31.2 g/dL (ref 30.0–36.0)
MCV: 100 fL (ref 78.0–100.0)
PLATELETS: 228 10*3/uL (ref 150–400)
RBC: 3.46 MIL/uL — AB (ref 3.87–5.11)
RDW: 13.3 % (ref 11.5–15.5)
WBC: 5.4 10*3/uL (ref 4.0–10.5)

## 2016-06-03 MED ORDER — HYDRALAZINE HCL 25 MG PO TABS
25.0000 mg | ORAL_TABLET | Freq: Once | ORAL | Status: AC
  Administered 2016-06-03: 25 mg via ORAL
  Filled 2016-06-03: qty 1

## 2016-06-03 NOTE — ED Triage Notes (Signed)
Pt reports to the ED for eval of low BP and dizziness. Pt reports she has been intermittent dizziness with standing x several months. She reports she had severe anxiety but she has that under control now. She reports she has been having problems with hypotension x over a week now. Family states BP has been running [id number] systolic. She is on Hydralazine and she has cut down to 1 time per week instead of 3.

## 2016-06-03 NOTE — ED Provider Notes (Signed)
[facility] DEPT Provider Note   CSN: [id number] Arrival date & time: 06/03/16  1638     History   Chief Complaint Chief Complaint  Patient presents with  . Dizziness    HPI Lisa Sawyer is a 82 y.o. female.  HPI Patient is concern that her blood pressure has been getting too low. She has been taking her blood pressure medications but has decreased her hydralazine dose to once a day. She states she feels lightheaded with standing intermittently. She also thinks that sometimes her anxiety contributes to symptoms of feeling weak and lightheaded. Patient has not taken any of her blood pressure medications today. Patient denies she has been having any chest pain or shortness of breath. No syncopal or near syncopal episodes. Past Medical History:  Diagnosis Date  . Acute blood loss anemia    secondary to surgery, requiring blood transfusion  . Anemia   . Anxiety attack   . breast ca dx'd 2009   pt states pre ca; lumpectomy-lt w/ xrt  . History of breast cancer   . Hypercholesterolemia   . Hypertension   . Hypokalemia    treated  . Hypothyroidism   . Idiopathic thrombocytopenic purpura (HCC)    A questionable history of idiopathic thrombocytopenic purpura  . Leukocytosis   . Osteoarthritis    left knee  . Pyrexia   . Thrombocytopenia ([place])   . Thyroid disease     Patient Active Problem List   Diagnosis Date Noted  . HTN (hypertension), malignant 05/16/2013  . Headache 05/16/2013  . Hyponatremia 05/16/2013  . Edema 02/21/2012  . Depression with anxiety 12/08/2011  . Palpitations 09/06/2011  . Anemia   . Thrombocytopenia ([place])   . CARCINOMA, BREAST 02/07/2007  . HYPOTHYROIDISM 02/07/2007  . HYPERTENSION, ESSENTIAL NOS 02/07/2007  . [place] DISEASE 02/07/2007    Past Surgical History:  Procedure Laterality Date  . CHOLECYSTECTOMY    . COLON SURGERY    . KNEE RECONSTRUCTION, MEDIAL PATELLAR FEMORAL LIGAMENT    . total knee      OB History    No data available       Home Medications    Prior to Admission medications   Medication Sig Start Date End Date Taking? Authorizing Provider  ALPRAZolam Duanne Moron) 0.5 MG tablet Take 0.5-1 tablets (0.25-0.5 mg total) by mouth 2 (two) times daily as needed for anxiety. 07/24/13   Barton Fanny, MD  aspirin EC 81 MG tablet Take 81 mg by mouth at bedtime.     Historical Provider, MD  calcium-vitamin D (OSCAL WITH D) 500-200 MG-UNIT per tablet Take 1 tablet by mouth daily.    Historical Provider, MD  famotidine (PEPCID) 20 MG tablet Take 1 tablet (20 mg total) by mouth 2 (two) times daily. 07/24/13   Barton Fanny, MD  fish oil-omega-3 fatty acids 1000 MG capsule Take 1 g by mouth daily.     Historical Provider, MD  levothyroxine (SYNTHROID, LEVOTHROID) 100 MCG tablet Take 1 tablet (100 mcg total) by mouth daily before breakfast. 07/24/13   Barton Fanny, MD  lisinopril (PRINIVIL,ZESTRIL) 40 MG tablet Take 1 tablet (40 mg total) by mouth daily. 07/24/13   Barton Fanny, MD  mometasone (NASONEX) 50 MCG/ACT nasal spray instill 2 sprays into each nostril once daily 07/24/13   Barton Fanny, MD  Multiple Vitamin (MULITIVITAMIN WITH MINERALS) TABS Take 1 tablet by mouth daily.    Historical Provider, MD  nebivolol (BYSTOLIC) 2.5 MG tablet Take 1  tablet (2.5 mg total) by mouth daily. 08/26/13   Barton Fanny, MD  sertraline (ZOLOFT) 25 MG tablet Take 1/2 tablet by mouth every morning or as directed. 07/24/13   Barton Fanny, MD    Family History Family History  Problem Relation Age of Onset  . Cancer Mother   . Heart disease Mother   . Heart disease Sister     Social History Social History  Substance Use Topics  . Smoking status: Former Research scientist (life sciences)  . Smokeless tobacco: Never Used  . Alcohol use No     Allergies   Naprosyn [naproxen]   Review of Systems Review of Systems 10 Systems reviewed and are negative for acute change except as noted in the  HPI.  Physical Exam Updated Vital Signs BP 166/57   Pulse (!) 56   Temp 98.6 F (37 C) (Oral)   Resp 20   SpO2 100%   Physical Exam  Constitutional: She is oriented to person, place, and time. She appears well-developed and well-nourished. No distress.  HENT:  Head: Normocephalic and atraumatic.  Nose: Nose normal.  Mouth/Throat: Oropharynx is clear and moist.  Eyes: EOM are normal. Pupils are equal, round, and reactive to light.  Neck: Neck supple.  Cardiovascular: Normal rate, regular rhythm, normal heart sounds and intact distal pulses.   Pulmonary/Chest: Effort normal and breath sounds normal.  Abdominal: Soft. She exhibits no distension. There is no tenderness.  Musculoskeletal: Normal range of motion. She exhibits no tenderness or deformity.  Both the patient's feet have loose, crenelated skin consistent with prior history of edema. Does not currently have edema.  Neurological: She is alert and oriented to person, place, and time. No cranial nerve deficit. She exhibits normal muscle tone. Coordination normal.  Skin: Skin is warm and dry.  Psychiatric: She has a normal mood and affect.     ED Treatments / Results  Labs (all labs ordered are listed, but only abnormal results are displayed) Labs Reviewed  BASIC METABOLIC PANEL - Abnormal; Notable for the following:       Result Value   Chloride 99 (*)    Glucose, Bld 107 (*)    BUN 43 (*)    Creatinine, Ser 1.51 (*)    GFR calc non Af Amer 31 (*)    GFR calc Af Amer 36 (*)    All other components within normal limits  CBC - Abnormal; Notable for the following:    RBC 3.46 (*)    Hemoglobin 10.8 (*)    HCT 34.6 (*)    All other components within normal limits  URINALYSIS, ROUTINE W REFLEX MICROSCOPIC (NOT AT [facility])  CBG MONITORING, ED    EKG  EKG Interpretation  Date/Time:  Friday June 03 2016 17:02:10 EDT Ventricular Rate:  67 PR Interval:  154 QRS Duration: 88 QT Interval:  416 QTC Calculation: 439 R  Axis:   7 Text Interpretation:  Sinus rhythm with Premature atrial complexes Septal infarct , age undetermined Abnormal ECG no significant change from previous. Confirmed by Johnney Killian, MD, Jeannie Done [phone]) on 06/03/2016 10:55:16 PM       Radiology No results found.  Procedures Procedures (including critical care time)  Medications Ordered in ED Medications  hydrALAZINE (APRESOLINE) tablet 25 mg (25 mg Oral Given 06/03/16 2301)     Initial Impression / Assessment and Plan / ED Course  I have reviewed the triage vital signs and the nursing notes.  Pertinent labs & imaging results that were available during  my care of the patient were reviewed by me and considered in my medical decision making (see chart for details).  Clinical Course     Final Clinical Impressions(s) / ED Diagnoses   Final diagnoses:  Dizziness  Renal insufficiency   Patient clinically well. Her main concern was that her blood pressure is getting too low. Her daughter reports that by too low, she means 110's to 120's. The patient perceives that she gets lightheaded then. Examining the patient's medications, she has Lasix which she reportedly is taking daily. I suspect her mild renal insufficiency and appearance of lower extremities of prior significant edema is due to mild overdiuresis. At this time, I will have the patient go to every other day usage of her Lasix. She is to continue her other regular medications. She reports she does have hydralazine which she is only taking once a day or less. He did not take any of her blood pressure medication today and is moderately hypertensive but asymptomatic. The patient is counseled to resume routine medications except for the Lasix on every other day basis. New Prescriptions New Prescriptions   No medications on file     Charlesetta Shanks, MD 06/04/16 [phone]

## 2016-06-03 NOTE — Discharge Instructions (Signed)
Decrease your Lasix (furosemide) dose to every other day. Continue your other medications as prescribed. Keep a log of your blood pressures.

## 2016-07-10 ENCOUNTER — Encounter (HOSPITAL_COMMUNITY): Payer: Self-pay | Admitting: *Deleted

## 2016-07-10 ENCOUNTER — Ambulatory Visit (HOSPITAL_COMMUNITY)
Admission: EM | Admit: 2016-07-10 | Discharge: 2016-07-10 | Disposition: A | Discharge status: Home / Self Care | Payer: Medicare Other | Attending: Family Medicine | Admitting: Family Medicine

## 2016-07-10 DIAGNOSIS — R51 Headache: Secondary | ICD-10-CM | POA: Diagnosis not present

## 2016-07-10 DIAGNOSIS — R519 Headache, unspecified: Secondary | ICD-10-CM

## 2016-07-10 MED ORDER — BUTALBITAL-APAP-CAFFEINE 50-325-40 MG PO TABS: 1.0000 | ORAL_TABLET | Freq: Four times a day (QID) | ORAL | 0 refills | Status: AC | PRN

## 2016-07-10 NOTE — ED Provider Notes (Signed)
[facility]    CSN: [id number] Arrival date & time: 07/10/16  1310     History   Chief Complaint Chief Complaint  Patient presents with  . Headache    HPI Lisa Sawyer is a 82 y.o. female.   This is an 82 year old woman who presents with a headache that began this morning. Until recently, the patient was living with her son in [place] but is now staying with her daughter here in [place].  Patient has an unstable gait and uses a cane.  Patient describes the pain as steady and mild on the right side of her head. She has no tenderness or unusual feeling homicidal the head. She has no eye pain or change in her vision. She is no ear pain or change in her hearing. She's not been having dizzy spells. She's had no nausea or vomiting, cough, or fever.      Past Medical History:  Diagnosis Date  . Acute blood loss anemia    secondary to surgery, requiring blood transfusion  . Anemia   . Anxiety attack   . breast ca dx'd 2009   pt states pre ca; lumpectomy-lt w/ xrt  . History of breast cancer   . Hypercholesterolemia   . Hypertension   . Hypokalemia    treated  . Hypothyroidism   . Idiopathic thrombocytopenic purpura (HCC)    A questionable history of idiopathic thrombocytopenic purpura  . Leukocytosis   . Osteoarthritis    left knee  . Pyrexia   . Thrombocytopenia ([place])   . Thyroid disease     Patient Active Problem List   Diagnosis Date Noted  . HTN (hypertension), malignant 05/16/2013  . Headache 05/16/2013  . Hyponatremia 05/16/2013  . Edema 02/21/2012  . Depression with anxiety 12/08/2011  . Palpitations 09/06/2011  . Anemia   . Thrombocytopenia ([place])   . CARCINOMA, BREAST 02/07/2007  . HYPOTHYROIDISM 02/07/2007  . HYPERTENSION, ESSENTIAL NOS 02/07/2007  . [place] DISEASE 02/07/2007    Past Surgical History:  Procedure Laterality Date  . BREAST LUMPECTOMY    . BREAST SURGERY    . CHOLECYSTECTOMY    . JOINT REPLACEMENT      . KNEE RECONSTRUCTION, MEDIAL PATELLAR FEMORAL LIGAMENT    . total knee      OB History    No data available       Home Medications    Prior to Admission medications   Medication Sig Start Date End Date Taking? Authorizing Provider  aspirin EC 81 MG tablet Take 81 mg by mouth at bedtime.    Yes Historical Provider, MD  atorvastatin (LIPITOR) 10 MG tablet Take 10 mg by mouth daily.   Yes Historical Provider, MD  calcium-vitamin D (OSCAL WITH D) 500-200 MG-UNIT per tablet Take 1 tablet by mouth daily.   Yes Historical Provider, MD  furosemide (LASIX) 40 MG tablet Take 40 mg by mouth daily.   Yes Historical Provider, MD  hydrALAZINE (APRESOLINE) 25 MG tablet Take 25 mg by mouth 3 (three) times daily.   Yes Historical Provider, MD  levothyroxine (SYNTHROID, LEVOTHROID) 100 MCG tablet Take 1 tablet (100 mcg total) by mouth daily before breakfast. 07/24/13  Yes Barton Fanny, MD  mometasone (NASONEX) 50 MCG/ACT nasal spray instill 2 sprays into each nostril once daily 07/24/13  Yes Barton Fanny, MD  pantoprazole (PROTONIX) 40 MG tablet Take 40 mg by mouth daily.   Yes Historical Provider, MD  sertraline (ZOLOFT) 25 MG tablet Take  1/2 tablet by mouth every morning or as directed. 07/24/13  Yes Barton Fanny, MD  valsartan (DIOVAN) 320 MG tablet Take 320 mg by mouth daily.   Yes Historical Provider, MD  butalbital-acetaminophen-caffeine (FIORICET, ESGIC) 50-325-40 MG tablet Take 1-2 tablets by mouth every 6 (six) hours as needed for headache. 07/10/16 07/10/17  Robyn Haber, MD    Family History Family History  Problem Relation Age of Onset  . Cancer Mother   . Heart disease Mother   . Heart disease Sister     Social History Social History  Substance Use Topics  . Smoking status: Former Research scientist (life sciences)  . Smokeless tobacco: Never Used  . Alcohol use No     Allergies   Naprosyn [naproxen]   Review of Systems Review of Systems  Constitutional: Negative.   HENT:  Negative.   Eyes: Negative.   Respiratory: Negative.   Cardiovascular: Negative.   Gastrointestinal: Negative.   Musculoskeletal: Negative.   Neurological: Positive for headaches. Negative for dizziness, seizures, syncope, facial asymmetry, speech difficulty, weakness and numbness.  Psychiatric/Behavioral: Negative.      Physical Exam Triage Vital Signs ED Triage Vitals  Enc Vitals Group     BP 07/10/16 1424 181/58     Pulse Rate 07/10/16 1424 63     Resp 07/10/16 1424 20     Temp 07/10/16 1424 98.2 F (36.8 C)     Temp Source 07/10/16 1424 Oral     SpO2 07/10/16 1424 100 %     Weight --      Height --      Head Circumference --      Peak Flow --      Pain Score 07/10/16 1432 2     Pain Loc --      Pain Edu? --      Excl. in [place]? --    No data found.   Updated Vital Signs BP 181/58 (BP Location: Right Arm)   Pulse 63   Temp 98.2 F (36.8 C) (Oral)   Resp 20   SpO2 100%   Physical Exam  Constitutional: She appears well-developed and well-nourished.  HENT:  Head: Normocephalic and atraumatic.  Right Ear: External ear normal.  Left Ear: External ear normal.  Nose: Nose normal.  Mouth/Throat: Oropharynx is clear and moist.  Eyes: Conjunctivae and EOM are normal. Pupils are equal, round, and reactive to light.  Pupils are 2 mm and equal, funduscopic reveals no change in the optic discs.  Neck: Normal range of motion. Neck supple. No thyromegaly present.  Cardiovascular: Normal rate, regular rhythm and normal heart sounds.   Pulmonary/Chest: Effort normal and breath sounds normal.  Musculoskeletal: Normal range of motion.  Lymphadenopathy:    She has no cervical adenopathy.  Neurological: She is alert. She displays normal reflexes. No cranial nerve deficit. Coordination normal.  Patient moves very slowly.  Patient is slow to respond to some of my questions and asked her daughter for help.  Skin: Skin is warm and dry.  Patient has an excoriated mild papular  erythematous rash on her left forearm  Psychiatric: Her behavior is normal.  Nursing note and vitals reviewed.    UC Treatments / Results  Labs (all labs ordered are listed, but only abnormal results are displayed) Labs Reviewed - No data to display  EKG  EKG Interpretation None       Radiology No results found.  Procedures Procedures (including critical care time)  Medications Ordered in UC Medications - No  data to display   Initial Impression / Assessment and Plan / UC Course  I have reviewed the triage vital signs and the nursing notes.  Pertinent labs & imaging results that were available during my care of the patient were reviewed by me and considered in my medical decision making (see chart for details).  Clinical Course     Final Clinical Impressions(s) / UC Diagnoses   Final diagnoses:  Headache disorder    New Prescriptions New Prescriptions   BUTALBITAL-ACETAMINOPHEN-CAFFEINE (FIORICET, ESGIC) 50-325-40 MG TABLET    Take 1-2 tablets by mouth every 6 (six) hours as needed for headache.     Robyn Haber, MD 07/10/16 [phone]

## 2016-07-10 NOTE — Discharge Instructions (Signed)
We did not detect any acute or severe problem during this examination. If your headache worsens or he develop new symptoms, please return either here or the emergency room for further evaluation`

## 2016-07-10 NOTE — ED Triage Notes (Signed)
Reports waking this AM with pulsating right temporal HA.  Denies hx.  Denies any other sxs.  Pain has eased to a 2/10.  Denies nausea, vomiting, vision changes, weakness, speech changes.  C/O feeling easily fatigued.

## 2016-08-23 ENCOUNTER — Encounter (HOSPITAL_COMMUNITY): Payer: Self-pay

## 2016-08-23 ENCOUNTER — Emergency Department (HOSPITAL_COMMUNITY)
Admission: EM | Admit: 2016-08-23 | Discharge: 2016-08-24 | Disposition: A | Discharge status: Home / Self Care | Payer: Medicare Other | Attending: Emergency Medicine | Admitting: Emergency Medicine

## 2016-08-23 DIAGNOSIS — E039 Hypothyroidism, unspecified: Secondary | ICD-10-CM | POA: Insufficient documentation

## 2016-08-23 DIAGNOSIS — Z87891 Personal history of nicotine dependence: Secondary | ICD-10-CM | POA: Diagnosis not present

## 2016-08-23 DIAGNOSIS — I10 Essential (primary) hypertension: Secondary | ICD-10-CM | POA: Diagnosis not present

## 2016-08-23 DIAGNOSIS — Z853 Personal history of malignant neoplasm of breast: Secondary | ICD-10-CM | POA: Diagnosis not present

## 2016-08-23 DIAGNOSIS — R531 Weakness: Secondary | ICD-10-CM | POA: Diagnosis present

## 2016-08-23 DIAGNOSIS — Z96659 Presence of unspecified artificial knee joint: Secondary | ICD-10-CM | POA: Diagnosis not present

## 2016-08-23 DIAGNOSIS — Z7982 Long term (current) use of aspirin: Secondary | ICD-10-CM | POA: Insufficient documentation

## 2016-08-23 NOTE — ED Triage Notes (Addendum)
Pt presents for evaluation of ongoing weakness. Pt. Unable to state how long it has been going on but states "awhile." Pt. Lives with daughter at home. Pt reports some short term memory issues. Pt. Seen in early November for headaches/dizziness. Pt. Ambulatory in triage with cane and steady gait. Pt denies pain/SOB. This RN spoke with pt daughter at request of pt, daughter states pt has some concern over potential side effects of medications from reading labels on medication bottles and she doesn't know how to answer her questions.

## 2016-08-23 NOTE — ED Provider Notes (Signed)
[facility] DEPT Provider Note   CSN: [id number] Arrival date & time: 08/23/16  1732   By signing my name below, I, Eunice Blase, attest that this documentation has been prepared under the direction and in the presence of Orpah Greek, MD. Electronically signed, Eunice Blase, ED Scribe. 08/24/16. 4:22 AM.   History   Chief Complaint Chief Complaint  Patient presents with  . Weakness   The history is provided by the patient and medical records. No language interpreter was used.    HPI Comments: Lisa Sawyer is a 82 y.o. female who presents to the Emergency Department for a follow up. Pt reports weakness in her joints. She notes LTN and short term memory lapse. She expresses concern for side effects with her acid reflux medication, and she believes it may be causing her weakness. Pt denies recent sickness or Hx of heart burn.  Past Medical History:  Diagnosis Date  . Acute blood loss anemia    secondary to surgery, requiring blood transfusion  . Anemia   . Anxiety attack   . breast ca dx'd 2009   pt states pre ca; lumpectomy-lt w/ xrt  . History of breast cancer   . Hypercholesterolemia   . Hypertension   . Hypokalemia    treated  . Hypothyroidism   . Idiopathic thrombocytopenic purpura (HCC)    A questionable history of idiopathic thrombocytopenic purpura  . Leukocytosis   . Osteoarthritis    left knee  . Pyrexia   . Thrombocytopenia ([place])   . Thyroid disease     Patient Active Problem List   Diagnosis Date Noted  . HTN (hypertension), malignant 05/16/2013  . Headache 05/16/2013  . Hyponatremia 05/16/2013  . Edema 02/21/2012  . Depression with anxiety 12/08/2011  . Palpitations 09/06/2011  . Anemia   . Thrombocytopenia ([place])   . CARCINOMA, BREAST 02/07/2007  . HYPOTHYROIDISM 02/07/2007  . HYPERTENSION, ESSENTIAL NOS 02/07/2007  . [place] DISEASE 02/07/2007    Past Surgical History:  Procedure Laterality Date  . BREAST  LUMPECTOMY    . BREAST SURGERY    . CHOLECYSTECTOMY    . JOINT REPLACEMENT    . KNEE RECONSTRUCTION, MEDIAL PATELLAR FEMORAL LIGAMENT    . total knee      OB History    No data available       Home Medications    Prior to Admission medications   Medication Sig Start Date End Date Taking? Authorizing Provider  aspirin EC 81 MG tablet Take 81 mg by mouth at bedtime.    Yes Historical Provider, MD  calcium-vitamin D (OSCAL WITH D) 500-200 MG-UNIT per tablet Take 1 tablet by mouth daily.   Yes Historical Provider, MD  furosemide (LASIX) 40 MG tablet Take 40 mg by mouth daily.   Yes Historical Provider, MD  hydrALAZINE (APRESOLINE) 25 MG tablet Take 25 mg by mouth at bedtime.    Yes Historical Provider, MD  levothyroxine (SYNTHROID, LEVOTHROID) 100 MCG tablet Take 1 tablet (100 mcg total) by mouth daily before breakfast. 07/24/13  Yes Barton Fanny, MD  mometasone (NASONEX) 50 MCG/ACT nasal spray instill 2 sprays into each nostril once daily Patient taking differently: Place 2 sprays into the nose daily as needed (for allergies).  07/24/13  Yes Barton Fanny, MD  pantoprazole (PROTONIX) 40 MG tablet Take 40 mg by mouth daily.   Yes Historical Provider, MD  valsartan (DIOVAN) 320 MG tablet Take 320 mg by mouth daily.   Yes Historical Provider,  MD  atorvastatin (LIPITOR) 10 MG tablet Take 10 mg by mouth daily.    Historical Provider, MD  butalbital-acetaminophen-caffeine (FIORICET, ESGIC) 50-325-40 MG tablet Take 1-2 tablets by mouth every 6 (six) hours as needed for headache. Patient not taking: Reported on 08/23/2016 07/10/16 07/10/17  Robyn Haber, MD  sertraline (ZOLOFT) 25 MG tablet Take 1/2 tablet by mouth every morning or as directed. Patient not taking: Reported on 08/23/2016 07/24/13   Barton Fanny, MD    Family History Family History  Problem Relation Age of Onset  . Cancer Mother   . Heart disease Mother   . Heart disease Sister     Social  History Social History  Substance Use Topics  . Smoking status: Former Research scientist (life sciences)  . Smokeless tobacco: Never Used  . Alcohol use No     Allergies   Naprosyn [naproxen]   Review of Systems Review of Systems  All other systems reviewed and are negative.    Physical Exam Updated Vital Signs BP (!) 141/52   Pulse 70   Temp 98 F (36.7 C) (Oral)   Resp 15   SpO2 100%   Physical Exam  Constitutional: She is oriented to person, place, and time. She appears well-developed and well-nourished. No distress.  HENT:  Head: Normocephalic and atraumatic.  Right Ear: Hearing normal.  Left Ear: Hearing normal.  Nose: Nose normal.  Mouth/Throat: Oropharynx is clear and moist and mucous membranes are normal.  Eyes: Conjunctivae and EOM are normal. Pupils are equal, round, and reactive to light.  Neck: Normal range of motion. Neck supple.  Cardiovascular: Regular rhythm, S1 normal and S2 normal.  Exam reveals no gallop and no friction rub.   No murmur heard. Pulmonary/Chest: Effort normal and breath sounds normal. No respiratory distress. She exhibits no tenderness.  Abdominal: Soft. Normal appearance and bowel sounds are normal. There is no hepatosplenomegaly. There is no tenderness. There is no rebound, no guarding, no tenderness at McBurney's point and negative Murphy's sign. No hernia.  Musculoskeletal: Normal range of motion.  Neurological: She is alert and oriented to person, place, and time. She has normal strength. No cranial nerve deficit or sensory deficit. Coordination normal. GCS eye subscore is 4. GCS verbal subscore is 5. GCS motor subscore is 6.  Skin: Skin is warm, dry and intact. No rash noted. No cyanosis.  Psychiatric: She has a normal mood and affect. Her speech is normal and behavior is normal. Thought content normal.  Nursing note and vitals reviewed.    ED Treatments / Results  DIAGNOSTIC STUDIES: Oxygen Saturation is 99% on RA, normal by my interpretation.     COORDINATION OF CARE: 4:22 AM Discussed treatment plan with pt at bedside and pt agreed to plan.  Labs (all labs ordered are listed, but only abnormal results are displayed) Labs Reviewed  CBC WITH DIFFERENTIAL/PLATELET - Abnormal; Notable for the following:       Result Value   RBC 3.82 (*)    Hemoglobin 11.9 (*)    All other components within normal limits  COMPREHENSIVE METABOLIC PANEL - Abnormal; Notable for the following:    Chloride 98 (*)    BUN 22 (*)    Creatinine, Ser 1.41 (*)    Calcium 10.6 (*)    GFR calc non Af Amer 34 (*)    GFR calc Af Amer 39 (*)    All other components within normal limits  URINALYSIS, ROUTINE W REFLEX MICROSCOPIC - Abnormal; Notable for the following:  Color, Urine STRAW (*)    All other components within normal limits  TSH - Abnormal; Notable for the following:    TSH 10.384 (*)    All other components within normal limits  I-STAT TROPOININ, ED    EKG  EKG Interpretation  Date/Time:  Wednesday August 24 2016 00:00:30 EST Ventricular Rate:  55 PR Interval:    QRS Duration: 95 QT Interval:  445 QTC Calculation: 422 R Axis:   -40 Text Interpretation:  Normal sinus rhythm LVH with secondary repolarization abnormality Anterior Q waves, possibly due to LVH Artifact in lead(s) I II aVR aVL aVF V1 and baseline wander in lead(s) III aVF No significant change since last tracing Confirmed by Rahmah Mccamy  MD, Mallery Harshman [id number]) on 08/24/2016 12:39:30 AM       Radiology No results found.  Procedures Procedures (including critical care time)  Medications Ordered in ED Medications  sodium chloride 0.9 % bolus 500 mL (0 mLs Intravenous Stopped 08/24/16 0234)  hydrALAZINE (APRESOLINE) injection 10 mg (10 mg Intravenous Given 08/24/16 0230)     Initial Impression / Assessment and Plan / ED Course  I have reviewed the triage vital signs and the nursing notes.  Pertinent labs & imaging results that were available during my care of the  patient were reviewed by me and considered in my medical decision making (see chart for details).  Clinical Course   Patient presented for evaluation of generalized weakness. Examination was nonfocal. Patient workup is unremarkable. No sign of infection. No anemia or metabolic abnormality. Patient's blood pressure was markedly elevated at arrival, has improved with hydralazine which she takes orally at home normally. The only abnormality found is elevated TSH which is likely resulting in her mild generalized weakness. Will need to follow-up with primary doctor for adjustments of dosing of Synthroid.   At this time there does not appear to be any evidence of an acute emergency medical condition and the patient appears stable for discharge with appropriate outpatient follow up.Diagnosis was discussed with patient who verbalizes understanding and is agreeable to discharge.   Final Clinical Impressions(s) / ED Diagnoses   Final diagnoses:  Weakness  Hypothyroidism, unspecified type    New Prescriptions New Prescriptions   No medications on file  I personally performed the services described in this documentation, which was scribed in my presence. The recorded information has been reviewed and is accurate.    Orpah Greek, MD 08/24/16 [phone]

## 2016-08-24 DIAGNOSIS — R531 Weakness: Secondary | ICD-10-CM | POA: Diagnosis not present

## 2016-08-24 LAB — URINALYSIS, ROUTINE W REFLEX MICROSCOPIC
BILIRUBIN URINE: NEGATIVE
GLUCOSE, UA: NEGATIVE mg/dL
HGB URINE DIPSTICK: NEGATIVE
KETONES UR: NEGATIVE mg/dL
LEUKOCYTES UA: NEGATIVE
Nitrite: NEGATIVE
PH: 7 (ref 5.0–8.0)
PROTEIN: NEGATIVE mg/dL
Specific Gravity, Urine: 1.006 (ref 1.005–1.030)

## 2016-08-24 LAB — COMPREHENSIVE METABOLIC PANEL
ALBUMIN: 4.3 g/dL (ref 3.5–5.0)
ALK PHOS: 52 U/L (ref 38–126)
ALT: 17 U/L (ref 14–54)
ANION GAP: 12 (ref 5–15)
AST: 23 U/L (ref 15–41)
BILIRUBIN TOTAL: 0.7 mg/dL (ref 0.3–1.2)
BUN: 22 mg/dL — ABNORMAL HIGH (ref 6–20)
CALCIUM: 10.6 mg/dL — AB (ref 8.9–10.3)
CO2: 29 mmol/L (ref 22–32)
CREATININE: 1.41 mg/dL — AB (ref 0.44–1.00)
Chloride: 98 mmol/L — ABNORMAL LOW (ref 101–111)
GFR calc Af Amer: 39 mL/min — ABNORMAL LOW (ref >60)
GFR calc non Af Amer: 34 mL/min — ABNORMAL LOW (ref >60)
GLUCOSE: 98 mg/dL (ref 65–99)
Potassium: 3.6 mmol/L (ref 3.5–5.1)
Sodium: 139 mmol/L (ref 135–145)
TOTAL PROTEIN: 7.7 g/dL (ref 6.5–8.1)

## 2016-08-24 LAB — CBC WITH DIFFERENTIAL/PLATELET
BASOS PCT: 1 %
Basophils Absolute: 0.1 10*3/uL (ref 0.0–0.1)
Eosinophils Absolute: 0.3 10*3/uL (ref 0.0–0.7)
Eosinophils Relative: 6 %
HEMATOCRIT: 37.5 % (ref 36.0–46.0)
HEMOGLOBIN: 11.9 g/dL — AB (ref 12.0–15.0)
LYMPHS ABS: 1.8 10*3/uL (ref 0.7–4.0)
Lymphocytes Relative: 36 %
MCH: 31.2 pg (ref 26.0–34.0)
MCHC: 31.7 g/dL (ref 30.0–36.0)
MCV: 98.2 fL (ref 78.0–100.0)
MONOS PCT: 7 %
Monocytes Absolute: 0.3 10*3/uL (ref 0.1–1.0)
NEUTROS ABS: 2.5 10*3/uL (ref 1.7–7.7)
NEUTROS PCT: 50 %
Platelets: 250 10*3/uL (ref 150–400)
RBC: 3.82 MIL/uL — AB (ref 3.87–5.11)
RDW: 13.5 % (ref 11.5–15.5)
WBC: 4.8 10*3/uL (ref 4.0–10.5)

## 2016-08-24 LAB — I-STAT TROPONIN, ED: Troponin i, poc: 0 ng/mL (ref 0.00–0.08)

## 2016-08-24 LAB — TSH: TSH: 10.384 u[IU]/mL — ABNORMAL HIGH (ref 0.350–4.500)

## 2016-08-24 MED ORDER — HYDRALAZINE HCL 20 MG/ML IJ SOLN
10.0000 mg | Freq: Once | INTRAMUSCULAR | Status: AC
  Administered 2016-08-24: 10 mg via INTRAVENOUS
  Filled 2016-08-24: qty 1

## 2016-08-24 MED ORDER — SODIUM CHLORIDE 0.9 % IV BOLUS (SEPSIS)
500.0000 mL | Freq: Once | INTRAVENOUS | Status: AC
  Administered 2016-08-24: 500 mL via INTRAVENOUS

## 2016-08-24 MED ORDER — LEVOTHYROXINE SODIUM 112 MCG PO TABS: 112.0000 ug | ORAL_TABLET | Freq: Every day | ORAL | 0 refills | Status: DC

## 2016-08-24 NOTE — ED Notes (Signed)
Called pt daughter, states she would come pick pt up.

## 2016-08-24 NOTE — ED Notes (Signed)
Pt had just ambulated back from bathroom with steady gait, this RN took IV out & instructed pt to get dressed while a wheelchair was being brought back to room. EMS personnel was walking by room and heard pt fall. Gillermo Murdoch RN went into room and found pt facedown on floor. Pt was helped back into bed with Merrily Brittle, Hal Hope RN, EMS personnel. This RN then entered room and found other staff members helping pt into bed with Pollina MD at bedside. Repeat vitals taken. Daughter also now at bedside and everything was explained to daughter. Pt reports nausea. Pollina MD wants to observe pt for few minutes then will DC

## 2016-08-24 NOTE — ED Notes (Signed)
Pt still reports no pain, VSS. EDP feels pt is stable for DC. Daughter at bedside, pt being wheeled out in chair

## 2016-08-24 NOTE — ED Notes (Signed)
Pt aware of need for urine sample. Unable to provide at this time.

## 2016-08-24 NOTE — ED Notes (Signed)
Pt ambulated to bathroom. Pt missed hat in the toilet, unable to collect urine sample at this time.

## 2016-08-24 NOTE — ED Notes (Signed)
Pt ambulatory to the restroom with no difficulty.

## 2017-08-09 NOTE — Progress Notes (Deleted)
No chief complaint on file.   HPI  4 review of systems  Past Medical History:  Diagnosis Date  . Acute blood loss anemia    secondary to surgery, requiring blood transfusion  . Anemia   . Anxiety attack   . breast ca dx'd 2009   pt states pre ca; lumpectomy-lt w/ xrt  . History of breast cancer   . Hypercholesterolemia   . Hypertension   . Hypokalemia    treated  . Hypothyroidism   . Idiopathic thrombocytopenic purpura (HCC)    A questionable history of idiopathic thrombocytopenic purpura  . Leukocytosis   . Osteoarthritis    left knee  . Pyrexia   . Thrombocytopenia ([place])   . Thyroid disease     Current Outpatient Medications  Medication Sig Dispense Refill  . aspirin EC 81 MG tablet Take 81 mg by mouth at bedtime.     Marland Kitchen atorvastatin (LIPITOR) 10 MG tablet Take 10 mg by mouth daily.    . calcium-vitamin D (OSCAL WITH D) 500-200 MG-UNIT per tablet Take 1 tablet by mouth daily.    . furosemide (LASIX) 40 MG tablet Take 40 mg by mouth daily.    . hydrALAZINE (APRESOLINE) 25 MG tablet Take 25 mg by mouth at bedtime.     Marland Kitchen levothyroxine (SYNTHROID, LEVOTHROID) 112 MCG tablet Take 1 tablet (112 mcg total) by mouth daily before breakfast. 30 tablet 0  . mometasone (NASONEX) 50 MCG/ACT nasal spray instill 2 sprays into each nostril once daily (Patient taking differently: Place 2 sprays into the nose daily as needed (for allergies). ) 17 g 2  . pantoprazole (PROTONIX) 40 MG tablet Take 40 mg by mouth daily.    . sertraline (ZOLOFT) 25 MG tablet Take 1/2 tablet by mouth every morning or as directed. (Patient not taking: Reported on 08/23/2016) 90 tablet 0  . valsartan (DIOVAN) 320 MG tablet Take 320 mg by mouth daily.     No current facility-administered medications for this visit.     Allergies:  Allergies  Allergen Reactions  . Naprosyn [Naproxen] Itching    Past Surgical History:  Procedure Laterality Date  . BREAST LUMPECTOMY    . BREAST SURGERY    .  CHOLECYSTECTOMY    . JOINT REPLACEMENT    . KNEE RECONSTRUCTION, MEDIAL PATELLAR FEMORAL LIGAMENT    . total knee      Social History   Socioeconomic History  . Marital status: Divorced    Spouse name: Not on file  . Number of children: Not on file  . Years of education: Not on file  . Highest education level: Not on file  Social Needs  . Financial resource strain: Not on file  . Food insecurity - worry: Not on file  . Food insecurity - inability: Not on file  . Transportation needs - medical: Not on file  . Transportation needs - non-medical: Not on file  Occupational History  . Not on file  Tobacco Use  . Smoking status: Former Research scientist (life sciences)  . Smokeless tobacco: Never Used  Substance and Sexual Activity  . Alcohol use: No  . Drug use: No  . Sexual activity: Not on file  Other Topics Concern  . Not on file  Social History Narrative  . Not on file    Family History  Problem Relation Age of Onset  . Cancer Mother   . Heart disease Mother   . Heart disease Sister      ROS Review of Systems See  HPI Constitution: No fevers or chills No malaise No diaphoresis Skin: No rash or itching Eyes: no blurry vision, no double vision GU: no dysuria or hematuria Neuro: no dizziness or headaches * all others reviewed and negative   Objective: There were no vitals filed for this visit.  Physical Exam  Assessment and Plan There are no diagnoses linked to this encounter.   Lisa Sawyer P [employer]

## 2017-08-10 ENCOUNTER — Ambulatory Visit: Payer: Medicare Other | Admitting: Family Medicine

## 2017-10-30 ENCOUNTER — Encounter (HOSPITAL_COMMUNITY): Payer: Self-pay

## 2017-10-30 ENCOUNTER — Emergency Department (HOSPITAL_COMMUNITY): Payer: Medicare Other

## 2017-10-30 ENCOUNTER — Other Ambulatory Visit: Payer: Self-pay

## 2017-10-30 DIAGNOSIS — I130 Hypertensive heart and chronic kidney disease with heart failure and stage 1 through stage 4 chronic kidney disease, or unspecified chronic kidney disease: Secondary | ICD-10-CM | POA: Insufficient documentation

## 2017-10-30 DIAGNOSIS — D693 Immune thrombocytopenic purpura: Secondary | ICD-10-CM | POA: Diagnosis not present

## 2017-10-30 DIAGNOSIS — E7800 Pure hypercholesterolemia, unspecified: Secondary | ICD-10-CM | POA: Diagnosis not present

## 2017-10-30 DIAGNOSIS — F0390 Unspecified dementia without behavioral disturbance: Secondary | ICD-10-CM | POA: Insufficient documentation

## 2017-10-30 DIAGNOSIS — I5032 Chronic diastolic (congestive) heart failure: Secondary | ICD-10-CM | POA: Insufficient documentation

## 2017-10-30 DIAGNOSIS — E876 Hypokalemia: Secondary | ICD-10-CM | POA: Diagnosis not present

## 2017-10-30 DIAGNOSIS — N183 Chronic kidney disease, stage 3 (moderate): Secondary | ICD-10-CM | POA: Diagnosis not present

## 2017-10-30 DIAGNOSIS — Z7982 Long term (current) use of aspirin: Secondary | ICD-10-CM | POA: Diagnosis not present

## 2017-10-30 DIAGNOSIS — Z886 Allergy status to analgesic agent status: Secondary | ICD-10-CM | POA: Insufficient documentation

## 2017-10-30 DIAGNOSIS — E785 Hyperlipidemia, unspecified: Secondary | ICD-10-CM | POA: Diagnosis not present

## 2017-10-30 DIAGNOSIS — D649 Anemia, unspecified: Secondary | ICD-10-CM | POA: Diagnosis not present

## 2017-10-30 DIAGNOSIS — Z79899 Other long term (current) drug therapy: Secondary | ICD-10-CM | POA: Diagnosis not present

## 2017-10-30 DIAGNOSIS — E039 Hypothyroidism, unspecified: Secondary | ICD-10-CM | POA: Diagnosis not present

## 2017-10-30 DIAGNOSIS — R0789 Other chest pain: Principal | ICD-10-CM | POA: Insufficient documentation

## 2017-10-30 DIAGNOSIS — R262 Difficulty in walking, not elsewhere classified: Secondary | ICD-10-CM | POA: Insufficient documentation

## 2017-10-30 DIAGNOSIS — Z87891 Personal history of nicotine dependence: Secondary | ICD-10-CM | POA: Insufficient documentation

## 2017-10-30 DIAGNOSIS — R0602 Shortness of breath: Secondary | ICD-10-CM | POA: Diagnosis present

## 2017-10-30 DIAGNOSIS — R079 Chest pain, unspecified: Secondary | ICD-10-CM | POA: Diagnosis present

## 2017-10-30 DIAGNOSIS — I700 Atherosclerosis of aorta: Secondary | ICD-10-CM | POA: Diagnosis not present

## 2017-10-30 DIAGNOSIS — Z853 Personal history of malignant neoplasm of breast: Secondary | ICD-10-CM | POA: Insufficient documentation

## 2017-10-30 LAB — BASIC METABOLIC PANEL
ANION GAP: 8 (ref 5–15)
BUN: 18 mg/dL (ref 6–20)
CO2: 26 mmol/L (ref 22–32)
Calcium: 9.8 mg/dL (ref 8.9–10.3)
Chloride: 106 mmol/L (ref 101–111)
Creatinine, Ser: 1.17 mg/dL — ABNORMAL HIGH (ref 0.44–1.00)
GFR calc Af Amer: 48 mL/min — ABNORMAL LOW (ref >60)
GFR calc non Af Amer: 42 mL/min — ABNORMAL LOW (ref >60)
GLUCOSE: 92 mg/dL (ref 65–99)
Potassium: 4.2 mmol/L (ref 3.5–5.1)
Sodium: 140 mmol/L (ref 135–145)

## 2017-10-30 LAB — CBC
HCT: 35.3 % — ABNORMAL LOW (ref 36.0–46.0)
HEMOGLOBIN: 11.2 g/dL — AB (ref 12.0–15.0)
MCH: 31 pg (ref 26.0–34.0)
MCHC: 31.7 g/dL (ref 30.0–36.0)
MCV: 97.8 fL (ref 78.0–100.0)
Platelets: 244 10*3/uL (ref 150–400)
RBC: 3.61 MIL/uL — ABNORMAL LOW (ref 3.87–5.11)
RDW: 13.4 % (ref 11.5–15.5)
WBC: 4.3 10*3/uL (ref 4.0–10.5)

## 2017-10-30 LAB — TROPONIN I

## 2017-10-30 NOTE — ED Triage Notes (Signed)
Patient c/o constant mid chest pressure x 2 days. Chest pain is worse with a deep breath. Patient also c/o SOB x 2 days.

## 2017-10-31 ENCOUNTER — Observation Stay (HOSPITAL_BASED_OUTPATIENT_CLINIC_OR_DEPARTMENT_OTHER): Payer: Medicare Other

## 2017-10-31 ENCOUNTER — Observation Stay (HOSPITAL_COMMUNITY): Payer: Medicare Other

## 2017-10-31 ENCOUNTER — Encounter (HOSPITAL_COMMUNITY): Payer: Self-pay | Admitting: Internal Medicine

## 2017-10-31 ENCOUNTER — Observation Stay (HOSPITAL_COMMUNITY)
Admission: EM | Admit: 2017-10-31 | Discharge: 2017-11-02 | Disposition: A | Discharge status: Home Health | Payer: Medicare Other | Attending: Internal Medicine | Admitting: Internal Medicine

## 2017-10-31 DIAGNOSIS — R079 Chest pain, unspecified: Secondary | ICD-10-CM

## 2017-10-31 DIAGNOSIS — R0602 Shortness of breath: Secondary | ICD-10-CM

## 2017-10-31 DIAGNOSIS — I160 Hypertensive urgency: Secondary | ICD-10-CM | POA: Diagnosis not present

## 2017-10-31 DIAGNOSIS — E039 Hypothyroidism, unspecified: Secondary | ICD-10-CM | POA: Diagnosis present

## 2017-10-31 DIAGNOSIS — C50919 Malignant neoplasm of unspecified site of unspecified female breast: Secondary | ICD-10-CM | POA: Diagnosis present

## 2017-10-31 DIAGNOSIS — D649 Anemia, unspecified: Secondary | ICD-10-CM | POA: Diagnosis present

## 2017-10-31 LAB — D-DIMER, QUANTITATIVE (NOT AT ARMC): D DIMER QUANT: 0.54 ug{FEU}/mL — AB (ref 0.00–0.50)

## 2017-10-31 LAB — CREATININE, SERUM
Creatinine, Ser: 1.06 mg/dL — ABNORMAL HIGH (ref 0.44–1.00)
GFR calc Af Amer: 54 mL/min — ABNORMAL LOW (ref >60)
GFR calc non Af Amer: 47 mL/min — ABNORMAL LOW (ref >60)

## 2017-10-31 LAB — URINALYSIS, ROUTINE W REFLEX MICROSCOPIC
Bacteria, UA: NONE SEEN
Bilirubin Urine: NEGATIVE
Glucose, UA: NEGATIVE mg/dL
Hgb urine dipstick: NEGATIVE
Ketones, ur: NEGATIVE mg/dL
Nitrite: NEGATIVE
PH: 5 (ref 5.0–8.0)
Protein, ur: NEGATIVE mg/dL
SPECIFIC GRAVITY, URINE: 1.016 (ref 1.005–1.030)

## 2017-10-31 LAB — CBC
HEMATOCRIT: 30.7 % — AB (ref 36.0–46.0)
HEMOGLOBIN: 9.9 g/dL — AB (ref 12.0–15.0)
MCH: 31.1 pg (ref 26.0–34.0)
MCHC: 32.2 g/dL (ref 30.0–36.0)
MCV: 96.5 fL (ref 78.0–100.0)
Platelets: 226 10*3/uL (ref 150–400)
RBC: 3.18 MIL/uL — ABNORMAL LOW (ref 3.87–5.11)
RDW: 13.4 % (ref 11.5–15.5)
WBC: 6.5 10*3/uL (ref 4.0–10.5)

## 2017-10-31 LAB — BRAIN NATRIURETIC PEPTIDE: B NATRIURETIC PEPTIDE 5: 380.1 pg/mL — AB (ref 0.0–100.0)

## 2017-10-31 LAB — TROPONIN I
TROPONIN I: 0.12 ng/mL — AB (ref <0.03)
Troponin I: <0.03 ng/mL (ref <0.03)
Troponin I: 0.06 ng/mL (ref <0.03)

## 2017-10-31 LAB — ECHOCARDIOGRAM COMPLETE

## 2017-10-31 MED ORDER — AMLODIPINE BESYLATE 5 MG PO TABS
5.0000 mg | ORAL_TABLET | Freq: Every day | ORAL | Status: DC
  Administered 2017-10-31 – 2017-11-02 (×3): 5 mg via ORAL
  Filled 2017-10-31 (×3): qty 1

## 2017-10-31 MED ORDER — HYDRALAZINE HCL 25 MG PO TABS
25.0000 mg | ORAL_TABLET | Freq: Two times a day (BID) | ORAL | Status: DC
  Filled 2017-10-31: qty 1

## 2017-10-31 MED ORDER — TECHNETIUM TO 99M ALBUMIN AGGREGATED
4.0300 | Freq: Once | INTRAVENOUS | Status: AC | PRN
Start: 2017-10-31 — End: 2017-10-31
  Administered 2017-10-31: 4.03 via INTRAVENOUS

## 2017-10-31 MED ORDER — ASPIRIN EC 81 MG PO TBEC
81.0000 mg | DELAYED_RELEASE_TABLET | Freq: Every day | ORAL | Status: DC
  Administered 2017-10-31: 81 mg via ORAL
  Filled 2017-10-31: qty 1

## 2017-10-31 MED ORDER — NITROGLYCERIN 0.4 MG SL SUBL: 0.4000 mg | SUBLINGUAL_TABLET | SUBLINGUAL | Status: DC | PRN

## 2017-10-31 MED ORDER — ENOXAPARIN SODIUM 40 MG/0.4ML ~~LOC~~ SOLN: 40.0000 mg | SUBCUTANEOUS | Status: DC

## 2017-10-31 MED ORDER — SERTRALINE HCL 25 MG PO TABS
12.5000 mg | ORAL_TABLET | Freq: Every day | ORAL | Status: DC
  Administered 2017-10-31 – 2017-11-02 (×3): 12.5 mg via ORAL
  Filled 2017-10-31: qty 1
  Filled 2017-10-31: qty 0.5
  Filled 2017-10-31 (×2): qty 1

## 2017-10-31 MED ORDER — ATORVASTATIN CALCIUM 10 MG PO TABS
10.0000 mg | ORAL_TABLET | Freq: Every day | ORAL | Status: DC
  Administered 2017-10-31: 10 mg via ORAL
  Filled 2017-10-31 (×2): qty 1

## 2017-10-31 MED ORDER — LEVOTHYROXINE SODIUM 25 MCG PO TABS
125.0000 ug | ORAL_TABLET | Freq: Every day | ORAL | Status: DC
  Administered 2017-10-31 – 2017-11-02 (×3): 125 ug via ORAL
  Filled 2017-10-31 (×4): qty 1

## 2017-10-31 MED ORDER — LOSARTAN POTASSIUM 50 MG PO TABS
50.0000 mg | ORAL_TABLET | Freq: Every day | ORAL | Status: DC
  Administered 2017-10-31 – 2017-11-01 (×2): 50 mg via ORAL
  Filled 2017-10-31 (×3): qty 1

## 2017-10-31 MED ORDER — ENOXAPARIN SODIUM 40 MG/0.4ML ~~LOC~~ SOLN
40.0000 mg | SUBCUTANEOUS | Status: DC
  Administered 2017-10-31 – 2017-11-01 (×2): 40 mg via SUBCUTANEOUS
  Filled 2017-10-31 (×3): qty 0.4

## 2017-10-31 MED ORDER — TECHNETIUM TC 99M DIETHYLENETRIAME-PENTAACETIC ACID
31.6000 | Freq: Once | INTRAVENOUS | Status: AC | PRN
  Administered 2017-10-31: 31.6 via RESPIRATORY_TRACT

## 2017-10-31 MED ORDER — HYDRALAZINE HCL 20 MG/ML IJ SOLN
10.0000 mg | INTRAMUSCULAR | Status: AC
  Administered 2017-10-31: 10 mg via INTRAVENOUS
  Filled 2017-10-31: qty 1

## 2017-10-31 MED ORDER — FUROSEMIDE 10 MG/ML IJ SOLN: 40.0000 mg | Freq: Every day | INTRAMUSCULAR | Status: DC

## 2017-10-31 MED ORDER — FUROSEMIDE 10 MG/ML IJ SOLN
40.0000 mg | Freq: Once | INTRAMUSCULAR | Status: AC
  Administered 2017-10-31: 40 mg via INTRAVENOUS
  Filled 2017-10-31: qty 4

## 2017-10-31 MED ORDER — HYDRALAZINE HCL 50 MG PO TABS
50.0000 mg | ORAL_TABLET | Freq: Two times a day (BID) | ORAL | Status: DC
  Administered 2017-10-31 – 2017-11-02 (×4): 50 mg via ORAL
  Filled 2017-10-31 (×4): qty 1

## 2017-10-31 MED ORDER — ACETAMINOPHEN 325 MG PO TABS: 650.0000 mg | ORAL_TABLET | ORAL | Status: DC | PRN

## 2017-10-31 MED ORDER — ONDANSETRON HCL 4 MG/2ML IJ SOLN: 4.0000 mg | Freq: Four times a day (QID) | INTRAMUSCULAR | Status: DC | PRN

## 2017-10-31 NOTE — H&P (Signed)
History and Physical    Lisa Sawyer IOE:[id number] DOB: Jun 10, 1932 DOA: 10/31/2017  PCP: Patient, No Pcp Per  Patient coming from: Home.  Chief Complaint: Chest pressure and shortness of breath.  HPI: Lisa Sawyer is a 84 y.o. female with history of hypertension, hypothyroidism, hyperlipidemia and grade 2 diastolic dysfunction presents to the ER with complaint of chest pressure.  Patient states over the last 2-3 days patient has been experiencing shortness of breath on walking and since last afternoon started experiencing exertional chest pressure.  Patient chest pressure improved on sitting.  Denies any productive cough fever chills.  Denies any lower extremity edema.  Patient states she had ran out of some of the antihypertensives and has not been taking for the last week or so.  ED Course: In the ER patient blood pressure was found to be elevated.  Was ordered IV hydralazine and 1 dose of IV Lasix for the shortness of breath.  Chest x-ray does not show anything acute.  BNP was around 380.  EKG shows normal sinus rhythm and troponin is negative.  Patient on sitting does not have any chest pressure.  Patient had just gone to the bathroom when she felt the chest pressure again.  Review of Systems: As per HPI, rest all negative.   Past Medical History:  Diagnosis Date  . Acute blood loss anemia    secondary to surgery, requiring blood transfusion  . Anemia   . Anxiety attack   . breast ca dx'd 2009   pt states pre ca; lumpectomy-lt w/ xrt  . History of breast cancer   . Hypercholesterolemia   . Hypertension   . Hypokalemia    treated  . Hypothyroidism   . Idiopathic thrombocytopenic purpura (HCC)    A questionable history of idiopathic thrombocytopenic purpura  . Leukocytosis   . Osteoarthritis    left knee  . Pyrexia   . Thrombocytopenia ([place])   . Thyroid disease     Past Surgical History:  Procedure Laterality Date  . BREAST LUMPECTOMY    . BREAST SURGERY    .  CHOLECYSTECTOMY    . JOINT REPLACEMENT    . KNEE RECONSTRUCTION, MEDIAL PATELLAR FEMORAL LIGAMENT    . total knee       reports that she has quit smoking. she has never used smokeless tobacco. She reports that she does not drink alcohol or use drugs.  Allergies  Allergen Reactions  . Naprosyn [Naproxen] Itching    Family History  Problem Relation Age of Onset  . Cancer Mother   . Heart disease Mother   . Heart disease Sister     Prior to Admission medications   Medication Sig Start Date End Date Taking? Authorizing Provider  aspirin EC 81 MG tablet Take 81 mg by mouth at bedtime.    Yes [provider]  atorvastatin (LIPITOR) 10 MG tablet Take 10 mg by mouth daily.   Yes [provider]  calcium-vitamin D (OSCAL WITH D) 500-200 MG-UNIT per tablet Take 1 tablet by mouth daily.   Yes [provider]  furosemide (LASIX) 40 MG tablet Take 40 mg by mouth daily.   Yes [provider]  hydrALAZINE (APRESOLINE) 25 MG tablet Take 25 mg by mouth 2 (two) times daily.    Yes [provider]  levothyroxine (SYNTHROID, LEVOTHROID) 125 MCG tablet Take 125 mcg by mouth daily before breakfast.   Yes [provider]  losartan (COZAAR) 50 MG tablet Take 50 mg  by mouth daily.   Yes [provider]  mometasone (NASONEX) 50 MCG/ACT nasal spray instill 2 sprays into each nostril once daily Patient taking differently: Place 2 sprays into the nose daily as needed (for allergies).  07/24/13  Yes Barton Fanny, MD  sertraline (ZOLOFT) 25 MG tablet Take 1/2 tablet by mouth every morning or as directed. 07/24/13  Yes Barton Fanny, MD  levothyroxine (SYNTHROID, LEVOTHROID) 112 MCG tablet Take 1 tablet (112 mcg total) by mouth daily before breakfast. Patient not taking: Reported on 10/31/2017 08/24/16   Orpah Greek, MD  BENADRYL 25 MG tablet Take 1 tablet (25 mg total) by mouth every 6 (six) hours as needed for itching. 01/12/12  03/31/12  Sheryle Hail, NP    Physical Exam: Vitals:   10/30/17 1850 10/30/17 1910 10/30/17 2321  BP: (!) 185/116 (!) 188/75 (!) 182/56  Pulse: 62  60  Resp: 11  18  Temp: 98.1 F (36.7 C)  98 F (36.7 C)  TempSrc: Oral  Oral  SpO2: 95%  100%  Weight:  63 kg (139 lb)   Height: 5\' 3"  (1.6 m)        Constitutional: Moderately built and nourished. Vitals:   10/30/17 1850 10/30/17 1910 10/30/17 2321  BP: (!) 185/116 (!) 188/75 (!) 182/56  Pulse: 62  60  Resp: 11  18  Temp: 98.1 F (36.7 C)  98 F (36.7 C)  TempSrc: Oral  Oral  SpO2: 95%  100%  Weight:  63 kg (139 lb)   Height: 5\' 3"  (1.6 m)     Eyes: Anicteric no pallor. ENMT: No discharge from the ears eyes nose or mouth. Neck: No JVD appreciated no mass felt. Respiratory: No rhonchi or crepitations. Cardiovascular: S1-S2 heard no murmurs appreciated. Abdomen: Soft nontender bowel sounds present.  No guarding or rigidity. Musculoskeletal:  no edema.  No joint effusion. Skin: No rash. Neurologic: Alert awake oriented to time place and person.  Moves all extremities. Psychiatric: Appears normal.  Normal affect.   Labs on Admission: I have personally reviewed following labs and imaging studies  CBC: Recent Labs  Lab 10/30/17 2005  WBC 4.3  HGB 11.2*  HCT 35.3*  MCV 97.8  PLT 323   Basic Metabolic Panel: Recent Labs  Lab 10/30/17 2005  NA 140  K 4.2  CL 106  CO2 26  GLUCOSE 92  BUN 18  CREATININE 1.17*  CALCIUM 9.8   GFR: Estimated Creatinine Clearance: 32 mL/min (A) (by C-G formula based on SCr of 1.17 mg/dL (H)). Liver Function Tests: No results for input(s): AST, ALT, ALKPHOS, BILITOT, PROT, ALBUMIN in the last 168 hours. No results for input(s): LIPASE, AMYLASE in the last 168 hours. No results for input(s): AMMONIA in the last 168 hours. Coagulation Profile: No results for input(s): INR, PROTIME in the last 168 hours. Cardiac Enzymes: Recent Labs  Lab 10/30/17 2005 10/31/17 0230    TROPONINI <0.03 <0.03   BNP (last 3 results) No results for input(s): PROBNP in the last 8760 hours. HbA1C: No results for input(s): HGBA1C in the last 72 hours. CBG: No results for input(s): GLUCAP in the last 168 hours. Lipid Profile: No results for input(s): CHOL, HDL, LDLCALC, TRIG, CHOLHDL, LDLDIRECT in the last 72 hours. Thyroid Function Tests: No results for input(s): TSH, T4TOTAL, FREET4, T3FREE, THYROIDAB in the last 72 hours. Anemia Panel: No results for input(s): VITAMINB12, FOLATE, FERRITIN, TIBC, IRON, RETICCTPCT in the last 72 hours. Urine analysis:  Component Value Date/Time   COLORURINE YELLOW 10/31/2017 0236   APPEARANCEUR CLEAR 10/31/2017 0236   LABSPEC 1.016 10/31/2017 0236   PHURINE 5.0 10/31/2017 0236   GLUCOSEU NEGATIVE 10/31/2017 0236   HGBUR NEGATIVE 10/31/2017 0236   BILIRUBINUR NEGATIVE 10/31/2017 0236   KETONESUR NEGATIVE 10/31/2017 0236   PROTEINUR NEGATIVE 10/31/2017 0236   UROBILINOGEN 0.2 05/19/2013 0110   NITRITE NEGATIVE 10/31/2017 0236   LEUKOCYTESUR TRACE (A) 10/31/2017 0236   Sepsis Labs: @LABRCNTIP (procalcitonin:4,lacticidven:4) )No results found for this or any previous visit (from the past 240 hour(s)).   Radiological Exams on Admission: Dg Chest 2 View  Result Date: 10/30/2017 CLINICAL DATA:  Dyspnea with central chest pain x2 days. History of breast cancer in 2009. EXAM: CHEST  2 VIEW COMPARISON:  05/16/2013 CXR, chest CT 08/15/2011 FINDINGS: The heart size and mediastinal contours are within normal limits. Tortuous atherosclerotic thoracic aorta. No aneurysm. The lungs appear hyperinflated without acute pneumonic consolidation, pneumothorax or effusion. The visualized skeletal structures are unremarkable. IMPRESSION: Hyperinflated lungs.  Aortic atherosclerosis. Electronically Signed   By: Ashley Royalty M.D.   On: 10/30/2017 19:43    EKG: Independently reviewed.  Normal sinus rhythm.  Assessment/Plan Principal Problem:   Chest  pain Active Problems:   Hypertensive urgency    1. Chest pain/chest pressure increasing on exertion concerning for angina -we will cycle cardiac markers keep patient on aspirin.  Restart home antihypertensives.  Consult cardiology.  Check 2D echo.  Since d-dimer is mildly elevated will check VQ scan. 2. Hypertensive urgency probably contributing to #1 -patient states she had not taken some of her antihypertensives since she ran out.  Patient was restarted on her home medication losartan and hydralazine.  We will also place patient on PRN IV hydralazine.  Patient also was given 1 dose of IV Lasix for shortness of breath. 3. History of grade 2 diastolic dysfunction per 2D echo done in 2013 which showed EF of 55-60% with patient complaining of shortness of breath particularly on exertion -1 dose of Lasix 40 mg IV was given.  Based on the response we will order further doses.  Check VQ scan. 4. Hypothyroidism on Synthroid. 5. Normocytic normochromic anemia appears to be chronic. 6. Hyperlipidemia on statins. 7. Chronic kidney disease stage III -follow metabolic panel.   DVT prophylaxis: Lovenox. Code Status: Full code. Family Communication: Discussed with patient. Disposition Plan: Home. Consults called: Cardiology. Admission status: Observation.   Rise Patience MD [facility] Pager [phone].  If 7PM-7AM, please contact night-coverage [email or web address] Password [facility]  10/31/2017, 5:34 AM

## 2017-10-31 NOTE — Progress Notes (Signed)
  Echocardiogram 2D Echocardiogram has been performed.  Technically difficult study due to patient positioning in bed. Unable to alter position.   Lisa Sawyer 10/31/2017, 9:42 AM

## 2017-10-31 NOTE — Progress Notes (Signed)
CRITICAL VALUE ALERT  Critical Value:  Troponin 0.06  Date & Time Notied:  10/31/2017 1531   Provider Notified: Dr. Herbert Moors  Orders Received/Actions taken: no new orders received at this time

## 2017-10-31 NOTE — ED Provider Notes (Signed)
[facility] DEPT Provider Note   CSN: [id number] Arrival date & time: 10/30/17  1832     History   Chief Complaint Chief Complaint  Patient presents with  . Chest Pain    HPI Lisa Sawyer is a 84 y.o. female with a hx of anemia, anxiety, breast cancer (2009 with lumpectomy), HTN, high cholesterol, ITP,  presents to the Emergency Department complaining of gradual, persistent, progressively worsening SOB exacerbated with exertion onset approx 1 week ago.  Associated symptoms include central chest pain described as pressure, lasting 1 hour earlier today and not resolved with rest.  She does report some swelling in her legs.  She has been out of her lasix fo an unknown amount of time and reports she is taking her Hydralazine only as needed for her HTN.  Pt also reports severe fatigue over the last week as well.  Pt denies recent illness or sick contacts.  Pt denies fever, chills, headache, neck pain, abdominal pain, nausea, vomiting, diarrhea, weakness, dizziness, syncope, dysuria, hematuria.     The history is provided by the patient and medical records. No language interpreter was used.    Past Medical History:  Diagnosis Date  . Acute blood loss anemia    secondary to surgery, requiring blood transfusion  . Anemia   . Anxiety attack   . breast ca dx'd 2009   pt states pre ca; lumpectomy-lt w/ xrt  . History of breast cancer   . Hypercholesterolemia   . Hypertension   . Hypokalemia    treated  . Hypothyroidism   . Idiopathic thrombocytopenic purpura (HCC)    A questionable history of idiopathic thrombocytopenic purpura  . Leukocytosis   . Osteoarthritis    left knee  . Pyrexia   . Thrombocytopenia ([place])   . Thyroid disease     Patient Active Problem List   Diagnosis Date Noted  . HTN (hypertension), malignant 05/16/2013  . Headache 05/16/2013  . Hyponatremia 05/16/2013  . Edema 02/21/2012  . Depression with anxiety 12/08/2011  .  Palpitations 09/06/2011  . Anemia   . Thrombocytopenia ([place])   . CARCINOMA, BREAST 02/07/2007  . HYPOTHYROIDISM 02/07/2007  . HYPERTENSION, ESSENTIAL NOS 02/07/2007  . [place] DISEASE 02/07/2007    Past Surgical History:  Procedure Laterality Date  . BREAST LUMPECTOMY    . BREAST SURGERY    . CHOLECYSTECTOMY    . JOINT REPLACEMENT    . KNEE RECONSTRUCTION, MEDIAL PATELLAR FEMORAL LIGAMENT    . total knee      OB History    No data available       Home Medications    Prior to Admission medications   Medication Sig Start Date End Date Taking? Authorizing Provider  aspirin EC 81 MG tablet Take 81 mg by mouth at bedtime.    Yes [provider]  atorvastatin (LIPITOR) 10 MG tablet Take 10 mg by mouth daily.   Yes [provider]  calcium-vitamin D (OSCAL WITH D) 500-200 MG-UNIT per tablet Take 1 tablet by mouth daily.   Yes [provider]  furosemide (LASIX) 40 MG tablet Take 40 mg by mouth daily.   Yes [provider]  hydrALAZINE (APRESOLINE) 25 MG tablet Take 25 mg by mouth 2 (two) times daily.    Yes [provider]  levothyroxine (SYNTHROID, LEVOTHROID) 125 MCG tablet Take 125 mcg by mouth daily before breakfast.   Yes [provider]  losartan (COZAAR) 50 MG tablet Take 50  mg by mouth daily.   Yes [provider]  mometasone (NASONEX) 50 MCG/ACT nasal spray instill 2 sprays into each nostril once daily Patient taking differently: Place 2 sprays into the nose daily as needed (for allergies).  07/24/13  Yes Barton Fanny, MD  sertraline (ZOLOFT) 25 MG tablet Take 1/2 tablet by mouth every morning or as directed. 07/24/13  Yes Barton Fanny, MD  levothyroxine (SYNTHROID, LEVOTHROID) 112 MCG tablet Take 1 tablet (112 mcg total) by mouth daily before breakfast. Patient not taking: Reported on 10/31/2017 08/24/16   Orpah Greek, MD  BENADRYL 25 MG tablet Take 1 tablet (25 mg total) by  mouth every 6 (six) hours as needed for itching. 01/12/12 03/31/12  Sheryle Hail, NP    Family History Family History  Problem Relation Age of Onset  . Cancer Mother   . Heart disease Mother   . Heart disease Sister     Social History Social History   Tobacco Use  . Smoking status: Former Research scientist (life sciences)  . Smokeless tobacco: Never Used  Substance Use Topics  . Alcohol use: No  . Drug use: No     Allergies   Naprosyn [naproxen]   Review of Systems Review of Systems  Constitutional: Negative for appetite change, diaphoresis, fatigue, fever and unexpected weight change.  HENT: Negative for mouth sores.   Eyes: Negative for visual disturbance.  Respiratory: Positive for shortness of breath. Negative for cough, chest tightness and wheezing.   Cardiovascular: Positive for chest pain and leg swelling.  Gastrointestinal: Negative for abdominal pain, constipation, diarrhea, nausea and vomiting.  Endocrine: Negative for polydipsia, polyphagia and polyuria.  Genitourinary: Negative for dysuria, frequency, hematuria and urgency.  Musculoskeletal: Negative for back pain and neck stiffness.  Skin: Negative for rash.  Allergic/Immunologic: Negative for immunocompromised state.  Neurological: Negative for syncope, light-headedness and headaches.  Hematological: Does not bruise/bleed easily.  Psychiatric/Behavioral: Negative for sleep disturbance. The patient is not nervous/anxious.      Physical Exam Updated Vital Signs BP (!) 182/56 (BP Location: Left Arm)   Pulse 60   Temp 98 F (36.7 C) (Oral)   Resp 18   Ht 5\' 3"  (1.6 m)   Wt 63 kg (139 lb)   SpO2 100%   BMI 24.62 kg/m   Physical Exam  Constitutional: She appears well-developed and well-nourished. No distress.  Awake, alert, nontoxic appearance  HENT:  Head: Normocephalic and atraumatic.  Mouth/Throat: Oropharynx is clear and moist. No oropharyngeal exudate.  Eyes: Conjunctivae are normal. No scleral icterus.  Neck:  Normal range of motion. Neck supple. JVD ( mild) present.  Cardiovascular: Normal rate, regular rhythm and intact distal pulses.  Pulmonary/Chest: Effort normal. No respiratory distress. She has decreased breath sounds (slightly). She has no wheezes. She has no rhonchi. She has no rales.  Equal chest expansion  Abdominal: Soft. Bowel sounds are normal. She exhibits no mass. There is no tenderness. There is no rebound and no guarding.  Musculoskeletal: Normal range of motion.       Right lower leg: She exhibits edema (nonpitting).  Neurological: She is alert.  Speech is clear and goal oriented Moves extremities without ataxia  Skin: Skin is warm and dry. She is not diaphoretic.  Psychiatric: She has a normal mood and affect.  Nursing note and vitals reviewed.    ED Treatments / Results  Labs (all labs ordered are listed, but only abnormal results are displayed) Labs Reviewed  BASIC METABOLIC PANEL - Abnormal; Notable  for the following components:      Result Value   Creatinine, Ser 1.17 (*)    GFR calc non Af Amer 42 (*)    GFR calc Af Amer 48 (*)    All other components within normal limits  CBC - Abnormal; Notable for the following components:   RBC 3.61 (*)    Hemoglobin 11.2 (*)    HCT 35.3 (*)    All other components within normal limits  D-DIMER, QUANTITATIVE (NOT AT [facility]) - Abnormal; Notable for the following components:   D-Dimer, Quant 0.54 (*)    All other components within normal limits  BRAIN NATRIURETIC PEPTIDE - Abnormal; Notable for the following components:   B Natriuretic Peptide 380.1 (*)    All other components within normal limits  URINALYSIS, ROUTINE W REFLEX MICROSCOPIC - Abnormal; Notable for the following components:   Leukocytes, UA TRACE (*)    Squamous Epithelial / LPF 0-5 (*)    All other components within normal limits  TROPONIN I  TROPONIN I    EKG  EKG Interpretation  Date/Time:  Tuesday October 31 2017 01:19:33 EST Ventricular Rate:   64 PR Interval:    QRS Duration: 95 QT Interval:  431 QTC Calculation: 445 R Axis:   -15 Text Interpretation:  Sinus rhythm Atrial premature complexes Anterior infarct, old septal Q waves unchanged  Confirmed by Ezequiel Essex [id number]) on 10/31/2017 1:23:57 AM        Radiology Dg Chest 2 View  Result Date: 10/30/2017 CLINICAL DATA:  Dyspnea with central chest pain x2 days. History of breast cancer in 2009. EXAM: CHEST  2 VIEW COMPARISON:  05/16/2013 CXR, chest CT 08/15/2011 FINDINGS: The heart size and mediastinal contours are within normal limits. Tortuous atherosclerotic thoracic aorta. No aneurysm. The lungs appear hyperinflated without acute pneumonic consolidation, pneumothorax or effusion. The visualized skeletal structures are unremarkable. IMPRESSION: Hyperinflated lungs.  Aortic atherosclerosis. Electronically Signed   By: Ashley Royalty M.D.   On: 10/30/2017 19:43    Procedures Procedures (including critical care time)  Medications Ordered in ED Medications  hydrALAZINE (APRESOLINE) injection 10 mg (not administered)  furosemide (LASIX) injection 40 mg (not administered)     Initial Impression / Assessment and Plan / ED Course  I have reviewed the triage vital signs and the nursing notes.  Pertinent labs & imaging results that were available during my care of the patient were reviewed by me and considered in my medical decision making (see chart for details).  Clinical Course as of Nov 01 435  Tue Oct 31, 2017  0108 Neg Troponin I: <0.03 [HM]  0109 Baseline Hemoglobin: (!) 11.2 [HM]  0109 Slightly improved Creatinine: (!) 1.17 [HM]  0109 No evidence of pneumonia, pneumothorax or pulmonary edema.  I personally evaluated these images. DG Chest 2 View [HM]  [phone] Discussed with Dr. Hal Hope who will admit.  [HM]    Clinical Course User Index [HM] Basil Buffin, Gwenlyn Perking    Patient presents with chest pain and shortness of breath onset earlier today.  She is  baseline anemic with baseline serum creatinine.  Patient has been off her Lasix at home and I suspect this is contributing to her shortness of breath.  Elevation in her BNP.  D-dimer is elevated however is reassuring for age adjustment.  Doubt PE.  Patient is hypertensive here in the emergency department.  Hydralazine given.  She also given Lasix.  She will need admission for further evaluation of her chest pain and shortness  of breath as well as diuresis.  The patient was discussed with and seen by Dr. Wyvonnia Dusky who agrees with the treatment plan.   BP (!) 156/59   Pulse (!) 130   Temp 98 F (36.7 C) (Oral)   Resp (!) 23   Ht 5\' 3"  (1.6 m)   Wt 63 kg (139 lb)   SpO2 97%   BMI 24.62 kg/m    Final Clinical Impressions(s) / ED Diagnoses   Final diagnoses:  Central chest pain  Shortness of breath    ED Discharge Orders    None       Agapito Games 10/31/17 [id number]    Ezequiel Essex, MD 10/31/17 [phone]

## 2017-10-31 NOTE — ED Notes (Signed)
IV team at bedside 

## 2017-10-31 NOTE — ED Notes (Signed)
ED TO INPATIENT HANDOFF REPORT  Name/Age/Gender Lisa Sawyer 84 y.o. female  Code Status    Code Status Orders  (From admission, onward)        Start     Ordered   10/31/17 0532  Full code  Continuous     10/31/17 0533    Code Status History    Date Active Date Inactive Code Status Order ID Comments User Context   05/16/2013 19:25 05/20/2013 20:44 Full Code [id number]  Sheila Oats, MD Inpatient      Home/SNF/Other Home  Chief Complaint Chest Pain  Level of Care/Admitting Diagnosis ED Disposition    ED Disposition Condition Comment   [place]: [facility] [[id number]]  Level of Care: Telemetry [5]  Admit to tele based on following criteria: Monitor for Ischemic changes  Diagnosis: Chest pain [299371]  Admitting Physician: Rise Patience [[id number]]  Attending Physician: Rise Patience [phone]  PT Class (Do Not Modify): Observation [104]  PT Acc Code (Do Not Modify): Observation [10022]       Medical History Past Medical History:  Diagnosis Date  . Acute blood loss anemia    secondary to surgery, requiring blood transfusion  . Anemia   . Anxiety attack   . breast ca dx'd 2009   pt states pre ca; lumpectomy-lt w/ xrt  . History of breast cancer   . Hypercholesterolemia   . Hypertension   . Hypokalemia    treated  . Hypothyroidism   . Idiopathic thrombocytopenic purpura (HCC)    A questionable history of idiopathic thrombocytopenic purpura  . Leukocytosis   . Osteoarthritis    left knee  . Pyrexia   . Thrombocytopenia ([place])   . Thyroid disease     Allergies Allergies  Allergen Reactions  . Naprosyn [Naproxen] Itching    IV Location/Drains/Wounds Patient Lines/Drains/Airways Status   Active Line/Drains/Airways    Name:   Placement date:   Placement time:   Site:   Days:   Peripheral IV 10/31/17 Anterior;Right;Proximal Forearm   10/31/17    0505    Forearm   less than 1           Labs/Imaging Results for orders placed or performed during the hospital encounter of 10/31/17 (from the past 48 hour(s))  Basic metabolic panel     Status: Abnormal   Collection Time: 10/30/17  8:05 PM  Result Value Ref Range   Sodium 140 135 - 145 mmol/L   Potassium 4.2 3.5 - 5.1 mmol/L   Chloride 106 101 - 111 mmol/L   CO2 26 22 - 32 mmol/L   Glucose, Bld 92 65 - 99 mg/dL   BUN 18 6 - 20 mg/dL   Creatinine, Ser 1.17 (H) 0.44 - 1.00 mg/dL   Calcium 9.8 8.9 - 10.3 mg/dL   GFR calc non Af Amer 42 (L) >60 mL/min   GFR calc Af Amer 48 (L) >60 mL/min    Comment: (NOTE) The eGFR has been calculated using the CKD EPI equation. This calculation has not been validated in all clinical situations. eGFR's persistently <60 mL/min signify possible Chronic Kidney Disease.    Anion gap 8 5 - 15    Comment: Performed at [facility], [place] [address] [place], [place] [address]  CBC     Status: Abnormal   Collection Time: 10/30/17  8:05 PM  Result Value Ref Range   WBC 4.3 4.0 - 10.5 K/uL   RBC 3.61 (L)  3.87 - 5.11 MIL/uL   Hemoglobin 11.2 (L) 12.0 - 15.0 g/dL   HCT 35.3 (L) 36.0 - 46.0 %   MCV 97.8 78.0 - 100.0 fL   MCH 31.0 26.0 - 34.0 pg   MCHC 31.7 30.0 - 36.0 g/dL   RDW 13.4 11.5 - 15.5 %   Platelets 244 150 - 400 K/uL    Comment: Performed at [facility], [place] [address], [place], [place] [address]  Troponin I     Status: None   Collection Time: 10/30/17  8:05 PM  Result Value Ref Range   Troponin I <0.03 <0.03 ng/mL    Comment: Performed at [facility], [place] [address] [place], [place] [address]  Troponin I     Status: None   Collection Time: 10/31/17  2:30 AM  Result Value Ref Range   Troponin I <0.03 <0.03 ng/mL    Comment: Performed at [facility], [place] [address] [place], [place] [address]  D-dimer, quantitative (not at [facility])     Status: Abnormal   Collection Time: 10/31/17  2:30 AM  Result  Value Ref Range   D-Dimer, Quant 0.54 (H) 0.00 - 0.50 ug/mL-FEU    Comment: (NOTE) At the manufacturer cut-off of 0.50 ug/mL FEU, this assay has been documented to exclude PE with a sensitivity and negative predictive value of 97 to 99%.  At this time, this assay has not been approved by the FDA to exclude DVT/VTE. Results should be correlated with clinical presentation. Performed at [facility], [place] [address] [place], [place] [address]   Brain natriuretic peptide     Status: Abnormal   Collection Time: 10/31/17  2:30 AM  Result Value Ref Range   B Natriuretic Peptide 380.1 (H) 0.0 - 100.0 pg/mL    Comment: Performed at [facility], [place] [address], [place], [place] [address]  Urinalysis, Routine w reflex microscopic     Status: Abnormal   Collection Time: 10/31/17  2:36 AM  Result Value Ref Range   Color, Urine YELLOW YELLOW   APPearance CLEAR CLEAR   Specific Gravity, Urine 1.016 1.005 - 1.030   pH 5.0 5.0 - 8.0   Glucose, UA NEGATIVE NEGATIVE mg/dL   Hgb urine dipstick NEGATIVE NEGATIVE   Bilirubin Urine NEGATIVE NEGATIVE   Ketones, ur NEGATIVE NEGATIVE mg/dL   Protein, ur NEGATIVE NEGATIVE mg/dL   Nitrite NEGATIVE NEGATIVE   Leukocytes, UA TRACE (A) NEGATIVE   RBC / HPF 0-5 0 - 5 RBC/hpf   WBC, UA 0-5 0 - 5 WBC/hpf   Bacteria, UA NONE SEEN NONE SEEN   Squamous Epithelial / LPF 0-5 (A) NONE SEEN   Mucus PRESENT     Comment: Performed at [facility], [place] [address], [place], [place] [address]   Dg Chest 2 View  Result Date: 10/30/2017 CLINICAL DATA:  Dyspnea with central chest pain x2 days. History of breast cancer in 2009. EXAM: CHEST  2 VIEW COMPARISON:  05/16/2013 CXR, chest CT 08/15/2011 FINDINGS: The heart size and mediastinal contours are within normal limits. Tortuous atherosclerotic thoracic aorta. No aneurysm. The lungs appear hyperinflated without acute pneumonic consolidation, pneumothorax or effusion. The  visualized skeletal structures are unremarkable. IMPRESSION: Hyperinflated lungs.  Aortic atherosclerosis. Electronically Signed   By: Ashley Royalty M.D.   On: 10/30/2017 19:43    Pending Labs Unresulted Labs (From admission, onward)   Start     Ordered   11/07/17 0500  Creatinine, serum  (enoxaparin (LOVENOX)    CrCl >/= 30 ml/min)  Weekly,   R    Comments:  while on enoxaparin therapy    10/31/17 0533   10/31/17 0533  CBC  (enoxaparin (LOVENOX)    CrCl >/= 30 ml/min)  Once,   R    Comments:  Baseline for enoxaparin therapy IF NOT ALREADY DRAWN.  Notify MD if PLT < 100 K.    10/31/17 0533   10/31/17 0533  Creatinine, serum  (enoxaparin (LOVENOX)    CrCl >/= 30 ml/min)  Once,   R    Comments:  Baseline for enoxaparin therapy IF NOT ALREADY DRAWN.    10/31/17 0533   10/31/17 0533  Troponin I (q 6hr x 3)  Now then every 6 hours,   R     10/31/17 0533      Vitals/Pain Today's Vitals   10/31/17 0630 10/31/17 0700 10/31/17 0718 10/31/17 0800  BP: (!) 156/59 (!) 124/49 (!) 124/49 (!) 135/53  Pulse:   63   Resp: (!) 23 (!) 22 (!) 22 14  Temp:      TempSrc:      SpO2:   97%   Weight:      Height:        Isolation Precautions No active isolations  Medications Medications  aspirin EC tablet 81 mg (not administered)  atorvastatin (LIPITOR) tablet 10 mg (not administered)  hydrALAZINE (APRESOLINE) tablet 25 mg (not administered)  levothyroxine (SYNTHROID, LEVOTHROID) tablet 125 mcg (not administered)  losartan (COZAAR) tablet 50 mg (not administered)  sertraline (ZOLOFT) tablet 12.5 mg (not administered)  acetaminophen (TYLENOL) tablet 650 mg (not administered)  ondansetron (ZOFRAN) injection 4 mg (not administered)  enoxaparin (LOVENOX) injection 40 mg (not administered)  nitroGLYCERIN (NITROSTAT) SL tablet 0.4 mg (not administered)  furosemide (LASIX) injection 40 mg (not administered)  hydrALAZINE (APRESOLINE) injection 10 mg (10 mg Intravenous Given 10/31/17 0600)  furosemide  (LASIX) injection 40 mg (40 mg Intravenous Given 10/31/17 0559)    Mobility walks

## 2017-10-31 NOTE — Progress Notes (Signed)
Per [facility] page on call cardiology about elevated troponin. On call cardiology Fudim notified writer to continue to monitor pt.

## 2017-10-31 NOTE — Consult Note (Signed)
Cardiology Consultation:   Patient ID: Lisa Sawyer; [id number]; 1931/10/01   Admit date: 10/31/2017 Date of Consult: 10/31/2017  Primary Care Provider: Patient, No Pcp Per Primary Cardiologist: Dr Acie Fredrickson in 2014 Primary Electrophysiologist:     Patient Profile:   Lisa Sawyer is a 84 y.o. female with a hx of HTN, HLD, breast cancer (s/p lumpectomy 2009), ?ITP, and anxiety who is being seen today for the evaluation of chest pain at the request of Dr. Herbert Moors.  History of Present Illness:   [name] was known to this service (last seen 2014) and was followed for hypertension. At her last visit, she was under situation stress and had uncontrolled hypertension. Echo in 2013 with normal LVEF and grade 1 DD. She did not tolerate lopressor. She then moved to [place] to live with her son who took care of her for worsening dementia. She moved back to [place] in 2017 to live with her daughter.   She presented to [facility] with worsening chest pain and SOB, per ED notes. On my interview, she is confused and mentions her dementia several times. She is unable to describe her chest pain. She is unsure if she has had shortness of breath for days or months. She is unsure if she has been taking her medicine.  The patient currently denies chest pain, but describes having anxiety yesterday.  She was hypertensive on arrival with systolic BP in the [id number], treated with hydralazine. She also complained of chest pressure located centrally and shortness of breath, which was resolved with IV lasix   It is unclear what medications she has been taking, but there are no lasix pills in her medication bag that was brought with her from home. There is mention of her being out of her hypertension medications at home, but hydralazine in her pill bag. It is unclear who helps her with medications at home.  Family friend is at bedside with limited information and I was unable to reach her daughter in [place] or son in [place].   Troponin  x 2 negative.  EKG with sinus rhythm and unchanged from prior. BNP 388.1 with sCr 1.17.   Past Medical History:  Diagnosis Date  . Acute blood loss anemia    secondary to surgery, requiring blood transfusion  . Anemia   . Anxiety attack   . breast ca dx'd 2009   pt states pre ca; lumpectomy-lt w/ xrt  . History of breast cancer   . Hypercholesterolemia   . Hypertension   . Hypokalemia    treated  . Hypothyroidism   . Idiopathic thrombocytopenic purpura (HCC)    A questionable history of idiopathic thrombocytopenic purpura  . Leukocytosis   . Osteoarthritis    left knee  . Pyrexia   . Thrombocytopenia ([place])   . Thyroid disease     Past Surgical History:  Procedure Laterality Date  . BREAST LUMPECTOMY    . BREAST SURGERY    . CHOLECYSTECTOMY    . JOINT REPLACEMENT    . KNEE RECONSTRUCTION, MEDIAL PATELLAR FEMORAL LIGAMENT    . total knee       Home Medications:  Prior to Admission medications   Medication Sig Start Date End Date Taking? Authorizing Provider  aspirin EC 81 MG tablet Take 81 mg by mouth at bedtime.    Yes [provider]  atorvastatin (LIPITOR) 10 MG tablet Take 10 mg by mouth daily.   Yes [provider]  calcium-vitamin D (OSCAL WITH D) 500-200 MG-UNIT  per tablet Take 1 tablet by mouth daily.   Yes [provider]  furosemide (LASIX) 40 MG tablet Take 40 mg by mouth daily.   Yes [provider]  hydrALAZINE (APRESOLINE) 25 MG tablet Take 25 mg by mouth 2 (two) times daily.    Yes [provider]  levothyroxine (SYNTHROID, LEVOTHROID) 125 MCG tablet Take 125 mcg by mouth daily before breakfast.   Yes [provider]  losartan (COZAAR) 50 MG tablet Take 50 mg by mouth daily.   Yes [provider]  mometasone (NASONEX) 50 MCG/ACT nasal spray instill 2 sprays into each nostril once daily Patient taking differently: Place 2 sprays into the nose daily as needed (for allergies).  07/24/13  Yes  Barton Fanny, MD  sertraline (ZOLOFT) 25 MG tablet Take 1/2 tablet by mouth every morning or as directed. 07/24/13  Yes Barton Fanny, MD  levothyroxine (SYNTHROID, LEVOTHROID) 112 MCG tablet Take 1 tablet (112 mcg total) by mouth daily before breakfast. Patient not taking: Reported on 10/31/2017 08/24/16   Orpah Greek, MD  BENADRYL 25 MG tablet Take 1 tablet (25 mg total) by mouth every 6 (six) hours as needed for itching. 01/12/12 03/31/12  Sheryle Hail, NP    Inpatient Medications: Scheduled Meds: . aspirin EC  81 mg Oral QHS  . atorvastatin  10 mg Oral Daily  . enoxaparin (LOVENOX) injection  40 mg Subcutaneous Q24H  . [START ON 11/01/2017] furosemide  40 mg Intravenous Daily  . hydrALAZINE  25 mg Oral BID  . levothyroxine  125 mcg Oral QAC breakfast  . losartan  50 mg Oral Daily  . sertraline  12.5 mg Oral Daily   Continuous Infusions:  PRN Meds: acetaminophen, nitroGLYCERIN, ondansetron (ZOFRAN) IV  Allergies:    Allergies  Allergen Reactions  . Naprosyn [Naproxen] Itching    Social History:   Social History   Socioeconomic History  . Marital status: Divorced    Spouse name: Not on file  . Number of children: Not on file  . Years of education: Not on file  . Highest education level: Not on file  Social Needs  . Financial resource strain: Not on file  . Food insecurity - worry: Not on file  . Food insecurity - inability: Not on file  . Transportation needs - medical: Not on file  . Transportation needs - non-medical: Not on file  Occupational History  . Not on file  Tobacco Use  . Smoking status: Former Research scientist (life sciences)  . Smokeless tobacco: Never Used  Substance and Sexual Activity  . Alcohol use: No  . Drug use: No  . Sexual activity: Not on file  Other Topics Concern  . Not on file  Social History Narrative  . Not on file    Family History:    Family History  Problem Relation Age of Onset  . Cancer Mother   . Heart disease Mother     . Heart disease Sister      ROS:  Please see the history of present illness.   All other ROS reviewed and negative.     Physical Exam/Data:   Vitals:   10/31/17 0700 10/31/17 0718 10/31/17 0800 10/31/17 0827  BP: (!) 124/49 (!) 124/49 (!) 135/53 (!) 135/53  Pulse:  63  63  Resp: (!) 22 (!) 22 14 15   Temp:      TempSrc:      SpO2:  97%  97%  Weight:  Height:       No intake or output data in the 24 hours ending 10/31/17 0917 Filed Weights   10/30/17 1910  Weight: 139 lb (63 kg)   Body mass index is 24.62 kg/m.  General:  Well nourished, well developed, in no acute distress HEENT: normal Neck: no JVD Vascular: No carotid bruits Cardiac:  normal S1, S2; RRR; no murmur Lungs:  clear to auscultation bilaterally, no wheezing, rhonchi or rales  Abd: soft, nontender, no hepatomegaly  Ext: trace edema Musculoskeletal:  No deformities, BUE and BLE strength normal and equal Skin: warm and dry  Neuro:  CNs 2-12 intact, no focal abnormalities noted Psych:  Normal affect   EKG:  The EKG was personally reviewed and demonstrates:  Sinus, unchanged from pior Telemetry:  Telemetry was personally reviewed and demonstrates:  sinus  Relevant CV Studies:  Echo pending  Echo 10/06/11: Study Conclusions - Left ventricle: The cavity size was normal. Wall thickness was increased in a pattern of mild LVH. Systolic function was normal. The estimated ejection fraction was in the range of 55% to 65%. Wall motion was normal; there were no regional wall motion abnormalities. Doppler parameters are consistent with abnormal left ventricular relaxation (grade 1 diastolic dysfunction). - Mitral valve: Mild regurgitation. - Atrial septum: No defect or patent foramen ovale was identified. - Tricuspid valve: Mild-moderate regurgitation. - Pulmonary arteries: PA peak pressure: 51mm Hg (S).   Laboratory Data:  Chemistry Recent Labs  Lab 10/30/17 2005  NA 140  K 4.2   CL 106  CO2 26  GLUCOSE 92  BUN 18  CREATININE 1.17*  CALCIUM 9.8  GFRNONAA 42*  GFRAA 48*  ANIONGAP 8    No results for input(s): PROT, ALBUMIN, AST, ALT, ALKPHOS, BILITOT in the last 168 hours. Hematology Recent Labs  Lab 10/30/17 2005  WBC 4.3  RBC 3.61*  HGB 11.2*  HCT 35.3*  MCV 97.8  MCH 31.0  MCHC 31.7  RDW 13.4  PLT 244   Cardiac Enzymes Recent Labs  Lab 10/30/17 2005 10/31/17 0230  TROPONINI <0.03 <0.03   No results for input(s): TROPIPOC in the last 168 hours.  BNP Recent Labs  Lab 10/31/17 0230  BNP 380.1*    DDimer  Recent Labs  Lab 10/31/17 0230  DDIMER 0.54*    Radiology/Studies:  Dg Chest 2 View  Result Date: 10/30/2017 CLINICAL DATA:  Dyspnea with central chest pain x2 days. History of breast cancer in 2009. EXAM: CHEST  2 VIEW COMPARISON:  05/16/2013 CXR, chest CT 08/15/2011 FINDINGS: The heart size and mediastinal contours are within normal limits. Tortuous atherosclerotic thoracic aorta. No aneurysm. The lungs appear hyperinflated without acute pneumonic consolidation, pneumothorax or effusion. The visualized skeletal structures are unremarkable. IMPRESSION: Hyperinflated lungs.  Aortic atherosclerosis. Electronically Signed   By: Ashley Royalty M.D.   On: 10/30/2017 19:43    Assessment and Plan:   1. Chest pain Troponin x 2 negative. EKG with sinus rhythm unchanged from 2017. The patient is a poor historian and it is unclear how her symptoms have presented and progressed. She is unsure if she saw a cardiologist in [place]. This chest pain sounds like it may be related to hypertensive urgency and anxiety. Will trend troponins. If they remain negative, may consider outpatient stress test once her blood pressure is well-controlled. Will repeat echocardiogram.    2. Chronic diastolic heart failure BNP elevated > 300 with sCr 1.17. Lasix bottle is not in her medication bag brought from home. [place]  mg PO lasix daily listed in her medication list. She  states she is breathing better following IV lasix. Will repeat an echocardiogram. She is not extremely volume overloaded on exam.   3. Hypertensive urgency Pressures were elevated with systolic [id number] on arrival, now controlled after IV hydralazine. She takes 25 mg PO hydralazine BID at home. Transition to this home dose and monitor pressures. Echo may guide medication adjustments.    4. Chronic kidney disease Baseline creatinine 1.41-1.51. sCr now 1.17. She is making urine.   For questions or updates, please contact [place] Please consult [email or web address] for contact info under Cardiology/STEMI.   Signed, [place], PA  10/31/2017 9:17 AM

## 2017-10-31 NOTE — ED Notes (Signed)
Ambulate PT to and from restroom. 02 drop 86% while in restroom. Place PT on 2L PT stat 94%. Place PT in bed stat 95% roomair

## 2017-10-31 NOTE — Progress Notes (Signed)
PROGRESS NOTE    Lisa Sawyer  QIH:[id number] DOB: 1932/08/19 DOA: 10/31/2017 PCP: System, Pcp Not In   Brief Narrative:   84 year old with past medical history relevant for hypertension, hypothyroidism, breast cancer status post treatment of some sort, dementia who comes in complaining of a vague history of chest pain for approximately 1 month.   Assessment & Plan:   Principal Problem:   Chest pain Active Problems:   Malignant neoplasm of female breast (HCC)   Hypothyroidism   Anemia   Shortness of breath   Hypertensive urgency   #) Chest pain: Based on age hypertension patient is at high risk for having coronary artery disease however her history is extremely poor due to underlying dementia.  She cannot give any coherent history of this chest pain.  She also appears to be complaining of some shortness of breath when interrogated further.  She cannot really prescribe much in the way of descriptions of these. -Per cardiology note they feel like this could be related to hypertensive urgency/emergency as she was quite hypertensive when she came in -Troponins negative -Echo shows normal EF with LVH -Telemetry  #) Elevated d-dimer: This was obtained this patient, planes of shortness of breath -VQ scan negative  #) Hypertension: Poorly controlled daughter apparently gives her her medications due to unclear complaint patient is with a regimen -Increase hydralazine to 50 mg mg twice daily  -start amlodipine 5 mg daily -Continue losartan 50 mg daily -Continue furosemide 40 mg daily  #) Hypothyroidism: -Continue levothyroxine 125 mcg  #) Hyperlipidemia: -Continue aspirin 81 mg -Continue atorvastatin 10 mg  Fluids: Tolerating p.o. Electrolytes: Monitor and supplement Nutrition: Heart healthy diet  Prophylaxis s: Enoxaparin  Disposition: Pending cycling cardiac biomarkers  Consultants:   Cardiology  Procedures: (Don't include imaging studies which can be auto  populated. Include things that cannot be auto populated i.e. Echo, Carotid and venous dopplers, Foley, Bipap, HD, tubes/drains, wound vac, central lines etc) Echo 10/31/2017:Left ventricle: The cavity size was normal. There was mild   concentric hypertrophy. Systolic function was normal. The   estimated ejection fraction was in the range of 60% to 65%. Wall   motion was normal; there were no regional wall motion   abnormalities. Doppler parameters are consistent with abnormal   left ventricular relaxation (grade 1 diastolic dysfunction).   Doppler parameters are consistent with elevated mean left atrial   filling pressure. - Tricuspid valve: There was mild-moderate regurgitation directed   centrally. - Pulmonary arteries: Systolic pressure was mildly increased. PA  peak pressure: 41 mm Hg (S).  VQ scan 10/31/2017: No evidence of PE  Antimicrobials: (specify start and planned stop date. Auto populated tables are space occupying and do not give end dates)  None   Subjective: Patient appears to be quite confused.  She can only tell me that she has been having some chest pain for the past month.  She cannot describe the quality, the radiation, timing of it.  She does prescribe report some shortness of breath.  There is a friend in the room who is also not quite sure of her symptoms.  Objective: Vitals:   10/31/17 1215 10/31/17 1312 10/31/17 1416 10/31/17 1418  BP: (!) 148/68 (!) 144/64 (!) 163/72   Pulse: 62 66 66   Resp: 20 18 20    Temp:   98.2 F (36.8 C)   TempSrc:   Oral   SpO2: 98% 97% (!) 88% 90%  Weight:      Height:  5\' 3"  (1.6 m)    No intake or output data in the 24 hours ending 10/31/17 1422 Filed Weights   10/30/17 1910  Weight: 63 kg (139 lb)    Examination:  General exam: Appears calm and comfortable  Respiratory system: Clear to auscultation. Respiratory effort normal. Cardiovascular system: Regular rate and rhythm, normal S1-S2 no murmurs Gastrointestinal  system: Abdomen is nondistended, soft and nontender. No organomegaly or masses felt. Normal bowel sounds heard. Central nervous system: Alert but not oriented, moving all extremities Extremities: No lower extremity edema Skin: No rashes on visible skin Psychiatry: J judgment and insight are impaired, mood and affect appear to be appropriate   Data Reviewed: I have personally reviewed following labs and imaging studies  CBC: Recent Labs  Lab 10/30/17 2005 10/31/17 1300  WBC 4.3 6.5  HGB 11.2* 9.9*  HCT 35.3* 30.7*  MCV 97.8 96.5  PLT 244 161   Basic Metabolic Panel: Recent Labs  Lab 10/30/17 2005  NA 140  K 4.2  CL 106  CO2 26  GLUCOSE 92  BUN 18  CREATININE 1.17*  CALCIUM 9.8   GFR: Estimated Creatinine Clearance: 32 mL/min (A) (by C-G formula based on SCr of 1.17 mg/dL (H)). Liver Function Tests: No results for input(s): AST, ALT, ALKPHOS, BILITOT, PROT, ALBUMIN in the last 168 hours. No results for input(s): LIPASE, AMYLASE in the last 168 hours. No results for input(s): AMMONIA in the last 168 hours. Coagulation Profile: No results for input(s): INR, PROTIME in the last 168 hours. Cardiac Enzymes: Recent Labs  Lab 10/30/17 2005 10/31/17 0230  TROPONINI <0.03 <0.03   BNP (last 3 results) No results for input(s): PROBNP in the last 8760 hours. HbA1C: No results for input(s): HGBA1C in the last 72 hours. CBG: No results for input(s): GLUCAP in the last 168 hours. Lipid Profile: No results for input(s): CHOL, HDL, LDLCALC, TRIG, CHOLHDL, LDLDIRECT in the last 72 hours. Thyroid Function Tests: No results for input(s): TSH, T4TOTAL, FREET4, T3FREE, THYROIDAB in the last 72 hours. Anemia Panel: No results for input(s): VITAMINB12, FOLATE, FERRITIN, TIBC, IRON, RETICCTPCT in the last 72 hours. Sepsis Labs: No results for input(s): PROCALCITON, LATICACIDVEN in the last 168 hours.  No results found for this or any previous visit (from the past 240 hour(s)).        Radiology Studies: Dg Chest 2 View  Result Date: 10/30/2017 CLINICAL DATA:  Dyspnea with central chest pain x2 days. History of breast cancer in 2009. EXAM: CHEST  2 VIEW COMPARISON:  05/16/2013 CXR, chest CT 08/15/2011 FINDINGS: The heart size and mediastinal contours are within normal limits. Tortuous atherosclerotic thoracic aorta. No aneurysm. The lungs appear hyperinflated without acute pneumonic consolidation, pneumothorax or effusion. The visualized skeletal structures are unremarkable. IMPRESSION: Hyperinflated lungs.  Aortic atherosclerosis. Electronically Signed   By: Ashley Royalty M.D.   On: 10/30/2017 19:43   Nm Pulmonary Perf And Vent  Result Date: 10/31/2017 CLINICAL DATA:  Constant mid chest pressure for 2 days, chest pain worsened with deep breathing, shortness of breath for 2 days, elevated D-dimer, intermediate clinical probability for pulmonary embolism EXAM: NUCLEAR MEDICINE VENTILATION - PERFUSION LUNG SCAN TECHNIQUE: Ventilation images were obtained in multiple projections using inhaled aerosol Tc-18m DTPA. Perfusion images were obtained in multiple projections after intravenous injection of Tc-57m-MAA. RADIOPHARMACEUTICALS:  31.6 mCi of Tc-66m DTPA aerosol inhalation and 4.03 mCi Tc27m-MAA IV COMPARISON:  None Correlation: Chest radiograph 10/30/2017 FINDINGS: Ventilation: Central airway deposition of aerosol. Small peripheral ventilation defects in  both lungs, particularly lateral LEFT lower lobe. Small amount of swallowed aerosol within the esophagus and stomach. Perfusion: Much better perfusion than ventilation peripherally. Diminished perfusion peripherally in the lateral LEFT lower lobe though less severe than ventilatory finding. No additional segmental or subsegmental perfusion defects. Chest radiograph: Hyperinflated/emphysematous lungs consistent with COPD. IMPRESSION: Low probability for pulmonary embolism. Electronically Signed   By: Lavonia Dana M.D.   On: 10/31/2017  13:27        Scheduled Meds: . aspirin EC  81 mg Oral QHS  . atorvastatin  10 mg Oral q1800  . enoxaparin (LOVENOX) injection  40 mg Subcutaneous Q24H  . [START ON 11/01/2017] furosemide  40 mg Intravenous Daily  . hydrALAZINE  25 mg Oral BID  . levothyroxine  125 mcg Oral QAC breakfast  . losartan  50 mg Oral Daily  . sertraline  12.5 mg Oral Daily   Continuous Infusions:   LOS: 0 days    Time spent: [place], MD [facility]   If 7PM-7AM, please contact night-coverage [email or web address] Password [email or web address] 10/31/2017, 2:22 PM

## 2017-10-31 NOTE — Progress Notes (Signed)
CRITICAL VALUE ALERT  Critical Value:  Troponin  Date & Time Notied:  11:09 PM 10/31/17   Provider Notified: Schorr  Orders Received/Actions taken: On call Schorr notified. Awaiting orders.

## 2017-11-01 DIAGNOSIS — N183 Chronic kidney disease, stage 3 (moderate): Secondary | ICD-10-CM | POA: Diagnosis not present

## 2017-11-01 DIAGNOSIS — R079 Chest pain, unspecified: Secondary | ICD-10-CM | POA: Diagnosis not present

## 2017-11-01 DIAGNOSIS — I160 Hypertensive urgency: Secondary | ICD-10-CM

## 2017-11-01 DIAGNOSIS — F0150 Vascular dementia without behavioral disturbance: Secondary | ICD-10-CM

## 2017-11-01 DIAGNOSIS — E039 Hypothyroidism, unspecified: Secondary | ICD-10-CM | POA: Diagnosis not present

## 2017-11-01 DIAGNOSIS — R0789 Other chest pain: Secondary | ICD-10-CM | POA: Diagnosis not present

## 2017-11-01 DIAGNOSIS — I130 Hypertensive heart and chronic kidney disease with heart failure and stage 1 through stage 4 chronic kidney disease, or unspecified chronic kidney disease: Secondary | ICD-10-CM | POA: Diagnosis not present

## 2017-11-01 DIAGNOSIS — E785 Hyperlipidemia, unspecified: Secondary | ICD-10-CM | POA: Diagnosis not present

## 2017-11-01 LAB — CBC
HEMATOCRIT: 34.4 % — AB (ref 36.0–46.0)
HEMOGLOBIN: 10.9 g/dL — AB (ref 12.0–15.0)
MCH: 31.1 pg (ref 26.0–34.0)
MCHC: 31.7 g/dL (ref 30.0–36.0)
MCV: 98 fL (ref 78.0–100.0)
Platelets: 258 10*3/uL (ref 150–400)
RBC: 3.51 MIL/uL — ABNORMAL LOW (ref 3.87–5.11)
RDW: 13.7 % (ref 11.5–15.5)
WBC: 6.7 10*3/uL (ref 4.0–10.5)

## 2017-11-01 LAB — BASIC METABOLIC PANEL
ANION GAP: 10 (ref 5–15)
BUN: 18 mg/dL (ref 6–20)
CHLORIDE: 100 mmol/L — AB (ref 101–111)
CO2: 28 mmol/L (ref 22–32)
Calcium: 10.1 mg/dL (ref 8.9–10.3)
Creatinine, Ser: 1.23 mg/dL — ABNORMAL HIGH (ref 0.44–1.00)
GFR calc non Af Amer: 39 mL/min — ABNORMAL LOW (ref >60)
GFR, EST AFRICAN AMERICAN: 45 mL/min — AB (ref >60)
Glucose, Bld: 132 mg/dL — ABNORMAL HIGH (ref 65–99)
Potassium: 3.6 mmol/L (ref 3.5–5.1)
Sodium: 138 mmol/L (ref 135–145)

## 2017-11-01 LAB — TSH: TSH: 0.029 u[IU]/mL — AB (ref 0.350–4.500)

## 2017-11-01 LAB — MAGNESIUM: Magnesium: 1.6 mg/dL — ABNORMAL LOW (ref 1.7–2.4)

## 2017-11-01 MED ORDER — ENOXAPARIN SODIUM 30 MG/0.3ML ~~LOC~~ SOLN
30.0000 mg | SUBCUTANEOUS | Status: DC
  Administered 2017-11-02: 30 mg via SUBCUTANEOUS
  Filled 2017-11-01: qty 0.3

## 2017-11-01 MED ORDER — LOSARTAN POTASSIUM 50 MG PO TABS: 75.0000 mg | ORAL_TABLET | Freq: Every day | ORAL | Status: DC

## 2017-11-01 MED ORDER — CARVEDILOL 6.25 MG PO TABS
6.2500 mg | ORAL_TABLET | Freq: Two times a day (BID) | ORAL | Status: DC
  Administered 2017-11-01 – 2017-11-02 (×2): 6.25 mg via ORAL
  Filled 2017-11-01 (×2): qty 1

## 2017-11-01 MED ORDER — FUROSEMIDE 40 MG PO TABS
40.0000 mg | ORAL_TABLET | Freq: Every day | ORAL | Status: DC
  Administered 2017-11-01: 40 mg via ORAL
  Filled 2017-11-01: qty 1

## 2017-11-01 MED ORDER — LOSARTAN POTASSIUM 50 MG PO TABS
50.0000 mg | ORAL_TABLET | Freq: Every day | ORAL | Status: DC
  Administered 2017-11-02: 50 mg via ORAL
  Filled 2017-11-01: qty 1

## 2017-11-01 MED ORDER — MAGNESIUM SULFATE 2 GM/50ML IV SOLN
2.0000 g | Freq: Once | INTRAVENOUS | Status: AC
  Administered 2017-11-01: 2 g via INTRAVENOUS
  Filled 2017-11-01: qty 50

## 2017-11-01 NOTE — Progress Notes (Signed)
Progress Note  Patient Name: Lisa Sawyer Date of Encounter: 11/01/2017  Primary Cardiologist: Dr. Oval Linsey  Subjective   Pt feels well today, she denies chest pain, shortness of breath, and palpitations. Family friend at bedside said she was agitated and more confused this morning stating "she was not herself."  Inpatient Medications    Scheduled Meds: . amLODipine  5 mg Oral Daily  . aspirin EC  81 mg Oral QHS  . atorvastatin  10 mg Oral q1800  . enoxaparin (LOVENOX) injection  40 mg Subcutaneous Q24H  . furosemide  40 mg Intravenous Daily  . hydrALAZINE  50 mg Oral BID  . levothyroxine  125 mcg Oral QAC breakfast  . losartan  50 mg Oral Daily  . sertraline  12.5 mg Oral Daily   Continuous Infusions:  PRN Meds: acetaminophen, nitroGLYCERIN, ondansetron (ZOFRAN) IV   Vital Signs    Vitals:   10/31/17 1435 10/31/17 2040 10/31/17 2057 11/01/17 0528  BP:  (!) 144/45 (!) 152/48 (!) 160/73  Pulse:   62 (!) 108  Resp:  18 18 18   Temp:  98.4 F (36.9 C) 97.6 F (36.4 C) 98.2 F (36.8 C)  TempSrc:  Oral Oral Oral  SpO2: 100% 100%  100%  Weight:      Height:        Intake/Output Summary (Last 24 hours) at 11/01/2017 0831 Last data filed at 11/01/2017 0200 Gross per 24 hour  Intake 240 ml  Output -  Net 240 ml   Filed Weights   10/30/17 1910 10/31/17 1416  Weight: 139 lb (63 kg) 133 lb 9.6 oz (60.6 kg)     Physical Exam   General: Well developed, thin female appearing in no acute distress. Head: Normocephalic, atraumatic.  Neck: Supple without bruits, no JVD Lungs:  Resp regular and unlabored, CTA. Heart: Irregular rhythm, regular rate, S1, S2, no S3, S4, or murmur; no rub. Abdomen: Soft, non-tender, non-distended with normoactive bowel sounds. No hepatomegaly. No rebound/guarding. No obvious abdominal masses. Extremities: No clubbing, cyanosis, no edema. Distal pedal pulses are 2+ bilaterally. Neuro: Alert and oriented X 3. Moves all extremities  spontaneously. Psych: Normal affect.  Labs    Chemistry Recent Labs  Lab 10/30/17 2005 10/31/17 1300  NA 140  --   K 4.2  --   CL 106  --   CO2 26  --   GLUCOSE 92  --   BUN 18  --   CREATININE 1.17* 1.06*  CALCIUM 9.8  --   GFRNONAA 42* 47*  GFRAA 48* 54*  ANIONGAP 8  --      Hematology Recent Labs  Lab 10/30/17 2005 10/31/17 1300  WBC 4.3 6.5  RBC 3.61* 3.18*  HGB 11.2* 9.9*  HCT 35.3* 30.7*  MCV 97.8 96.5  MCH 31.0 31.1  MCHC 31.7 32.2  RDW 13.4 13.4  PLT 244 226    Cardiac Enzymes Recent Labs  Lab 10/30/17 2005 10/31/17 0230 10/31/17 1300 10/31/17 1907  TROPONINI <0.03 <0.03 0.06* 0.12*   No results for input(s): TROPIPOC in the last 168 hours.   BNP Recent Labs  Lab 10/31/17 0230  BNP 380.1*     DDimer  Recent Labs  Lab 10/31/17 0230  DDIMER 0.54*     Radiology    Dg Chest 2 View  Result Date: 10/30/2017 CLINICAL DATA:  Dyspnea with central chest pain x2 days. History of breast cancer in 2009. EXAM: CHEST  2 VIEW COMPARISON:  05/16/2013 CXR, chest CT  08/15/2011 FINDINGS: The heart size and mediastinal contours are within normal limits. Tortuous atherosclerotic thoracic aorta. No aneurysm. The lungs appear hyperinflated without acute pneumonic consolidation, pneumothorax or effusion. The visualized skeletal structures are unremarkable. IMPRESSION: Hyperinflated lungs.  Aortic atherosclerosis. Electronically Signed   By: Ashley Royalty M.D.   On: 10/30/2017 19:43   Nm Pulmonary Perf And Vent  Result Date: 10/31/2017 CLINICAL DATA:  Constant mid chest pressure for 2 days, chest pain worsened with deep breathing, shortness of breath for 2 days, elevated D-dimer, intermediate clinical probability for pulmonary embolism EXAM: NUCLEAR MEDICINE VENTILATION - PERFUSION LUNG SCAN TECHNIQUE: Ventilation images were obtained in multiple projections using inhaled aerosol Tc-68m DTPA. Perfusion images were obtained in multiple projections after intravenous  injection of Tc-29m-MAA. RADIOPHARMACEUTICALS:  31.6 mCi of Tc-18m DTPA aerosol inhalation and 4.03 mCi Tc43m-MAA IV COMPARISON:  None Correlation: Chest radiograph 10/30/2017 FINDINGS: Ventilation: Central airway deposition of aerosol. Small peripheral ventilation defects in both lungs, particularly lateral LEFT lower lobe. Small amount of swallowed aerosol within the esophagus and stomach. Perfusion: Much better perfusion than ventilation peripherally. Diminished perfusion peripherally in the lateral LEFT lower lobe though less severe than ventilatory finding. No additional segmental or subsegmental perfusion defects. Chest radiograph: Hyperinflated/emphysematous lungs consistent with COPD. IMPRESSION: Low probability for pulmonary embolism. Electronically Signed   By: Lavonia Dana M.D.   On: 10/31/2017 13:27     Telemetry    Bouts of sinus tachycardia in the 120s - Personally Reviewed  ECG    No new tracings - Personally Reviewed   Cardiac Studies   Echo 10/31/17 Study Conclusions - Left ventricle: The cavity size was normal. There was mild   concentric hypertrophy. Systolic function was normal. The   estimated ejection fraction was in the range of 60% to 65%. Wall   motion was normal; there were no regional wall motion   abnormalities. Doppler parameters are consistent with abnormal   left ventricular relaxation (grade 1 diastolic dysfunction).   Doppler parameters are consistent with elevated mean left atrial   filling pressure. - Tricuspid valve: There was mild-moderate regurgitation directed   centrally. - Pulmonary arteries: Systolic pressure was mildly increased. PA   peak pressure: 41 mm Hg (S).   Echo 10/06/11: Study Conclusions - Left ventricle: The cavity size was normal. Wall thickness was increased in a pattern of mild LVH. Systolic function was normal. The estimated ejection fraction was in the range of 55% to 65%. Wall motion was normal; there were no regional  wall motion abnormalities. Doppler parameters are consistent with abnormal left ventricular relaxation (grade 1 diastolic dysfunction). - Mitral valve: Mild regurgitation. - Atrial septum: No defect or patent foramen ovale was identified. - Tricuspid valve: Mild-moderate regurgitation. - Pulmonary arteries: PA peak pressure: 69mm Hg (S).   Patient Profile     84 y.o. female with a hx of HTN, HLD, breast cancer (s/p lumpectomy 2009), ?ITP, and anxiety who is being seen today for the chest pain and hypertension.   Assessment & Plan    1. Chest pain Troponin trended up overnight: < 0.03 --> 0.06 --> 0.12. She denies chest pain overnight and today, and friend at bedside states she has not complained of chest pain today. Will consult with attending regarding heparin drip. Repeat EKG pending. Sinus tachycardia and hypertensive urgency may have resulted in demand ischemia driving the elevated troponin. If EKG changes, will discuss options for ischemic evaluation.   2. Hypertensive urgency Pressures are better controlled, but still elevated in  the 140-160s. Continue home losartan 50 mg daily. Home hydralazine was increased to 50 mg BID. Continue to monitor vitals.    3. Sinus tachycardia, anemia Overnight and today, she has had bouts of sinus tachycardia and PVCs  with rates in the 100-120s. Pt is asymptomatic. No signs of infection. Question dehydration as her PO intake charted in Epic is quite poor. In addition, her Hb is drifting down: 9.9 (11.2). Labs pending today. Given her age and comorbidities, would have a low threshold to transfuse for Hb less than 8.0 - per primary team.  Will check CBC, BMP, Mg, and TSH (as below).   4. Chronic diastolic heart failure She is euvolemic on exam. Will hold lasix for now given her poor PO intake.   5. Hypothyroidism Last TSH in Epic was 2017. Given her sinus tachycardia, will check another TSH.    Signed, Tami Lin Duke , PA-C 8:31  AM 11/01/2017 Pager: [phone]

## 2017-11-01 NOTE — Progress Notes (Signed)
Lovenox per Pharmacy for DVT Prophylaxis    Pharmacy has been consulted from dosing enoxaparin (lovenox) in this patient for DVT prophylaxis.  The pharmacist has reviewed pertinent labs (Hgb _10.9__; PLT__258_), patient weight (_60__kg) and renal function (CrCl_28__mL/min) and decided that enoxaparin _30_mg SQ Q24Hrs is appropriate for this patient.  The pharmacy department will sign off at this time.  Please reconsult pharmacy if status changes or for further issues.  Thank you  Cyndia Diver PharmD, BCPS  11/01/2017, 4:08 PM

## 2017-11-01 NOTE — Care Management Note (Signed)
Case Management Note  Patient Details  Name: Lisa Sawyer MRN: [id number] Date of Birth: 08/22/1932  Subjective/Objective:  84 y/o f admitted w/chest pain. From home w/dtr. Hx: Dementia.Recent move from [place]. TC dtr Rosalyn c#[phone](E[facility] correct tel# for Rosalyn) TC h#[phone]-left VM w/call back#. No pcp. Await PT recc.                  Action/Plan:d/c plan home.   Expected Discharge Date:  (unknown)               Expected Discharge Plan:  Home/Self Care  In-House Referral:     Discharge planning Services  CM Consult  Post Acute Care Choice:    Choice offered to:     DME Arranged:    DME Agency:     HH Arranged:    HH Agency:     Status of Service:  In process, will continue to follow  If discussed at Long Length of Stay Meetings, dates discussed:    Additional Comments:  Dessa Phi, RN 11/01/2017, 11:57 AM

## 2017-11-01 NOTE — Progress Notes (Signed)
Walked 150 ft in hall with PT assist.  Oxygen sat remained at 93% on RA.  No complaints of SOB.

## 2017-11-01 NOTE — Progress Notes (Addendum)
PROGRESS NOTE  Lisa Sawyer ZOX:[id number] DOB: Oct 04, 1931 DOA: 10/31/2017 PCP: System, Pcp Not In  HPI/Recap of past 24 hours:  She is confused, she denies chest pain  Assessment/Plan: Principal Problem:   Chest pain Active Problems:   Malignant neoplasm of female breast ([place])   Hypothyroidism   Anemia   Shortness of breath   Hypertensive urgency  Chest pain on presentation BNP is 380 , troponin  0.12  Cardiology consulted, felt chest pain is from hypertension urgency She is chest pain-free Blood pressure better controlled Cardiology input appreciated  Confusion with baseline dementia Per friend at baseline she is not oriented to time or place But her confusion is worse compared to her baseline No source of infection at the identified He does look dehydrated We will hold Lasix for now  Hypomagnesemia  Replace magnesium  CKD 3, renal function appears baseline  Anemia  hgb at baseline  Code Status: full  Family Communication: patient   Disposition Plan: Need cardiology clearance for discharge   Consultants:  Cardiology  Procedures:  None  Antibiotics:  None   Objective: BP (!) 175/63   Pulse 100   Temp 98.2 F (36.8 C) (Oral)   Resp 18   Ht 5\' 3"  (1.6 m)   Wt 60.6 kg (133 lb 9.6 oz)   SpO2 100%   BMI 23.67 kg/m   Intake/Output Summary (Last 24 hours) at 11/01/2017 2033 Last data filed at 11/01/2017 1900 Gross per 24 hour  Intake 580 ml  Output -  Net 580 ml   Filed Weights   10/30/17 1910 10/31/17 1416  Weight: 63 kg (139 lb) 60.6 kg (133 lb 9.6 oz)    Exam: Patient is examined daily including today on 11/01/2017, exams remain the same as of yesterday except that has changed    General:  NAD, confused  Cardiovascular: RRR  Respiratory: CTABL  Abdomen: Soft/ND/NT, positive BS  Musculoskeletal: No Edema  Neuro: alert, oriented to person  Data Reviewed: Basic Metabolic Panel: Recent Labs  Lab 10/30/17 2005  10/31/17 1300 11/01/17 1256  NA 140  --  138  K 4.2  --  3.6  CL 106  --  100*  CO2 26  --  28  GLUCOSE 92  --  132*  BUN 18  --  18  CREATININE 1.17* 1.06* 1.23*  CALCIUM 9.8  --  10.1  MG  --   --  1.6*   Liver Function Tests: No results for input(s): AST, ALT, ALKPHOS, BILITOT, PROT, ALBUMIN in the last 168 hours. No results for input(s): LIPASE, AMYLASE in the last 168 hours. No results for input(s): AMMONIA in the last 168 hours. CBC: Recent Labs  Lab 10/30/17 2005 10/31/17 1300 11/01/17 1256  WBC 4.3 6.5 6.7  HGB 11.2* 9.9* 10.9*  HCT 35.3* 30.7* 34.4*  MCV 97.8 96.5 98.0  PLT 244 226 258   Cardiac Enzymes:   Recent Labs  Lab 10/30/17 2005 10/31/17 0230 10/31/17 1300 10/31/17 1907  TROPONINI <0.03 <0.03 0.06* 0.12*   BNP (last 3 results) Recent Labs    10/31/17 0230  BNP 380.1*    ProBNP (last 3 results) No results for input(s): PROBNP in the last 8760 hours.  CBG: No results for input(s): GLUCAP in the last 168 hours.  No results found for this or any previous visit (from the past 240 hour(s)).   Studies: No results found.  Scheduled Meds: . amLODipine  5 mg Oral Daily  . aspirin EC  81 mg Oral QHS  . atorvastatin  10 mg Oral q1800  . carvedilol  6.25 mg Oral BID WC  . [START ON 11/02/2017] enoxaparin (LOVENOX) injection  30 mg Subcutaneous Q24H  . hydrALAZINE  50 mg Oral BID  . levothyroxine  125 mcg Oral QAC breakfast  . [START ON 11/02/2017] losartan  50 mg Oral Daily  . sertraline  12.5 mg Oral Daily    Continuous Infusions: . magnesium sulfate 1 - 4 g bolus IVPB       Time spent: 34mins I have personally reviewed and interpreted on  11/01/2017 daily labs, tele strips, imagings as discussed above under date review session and assessment and plans.  I reviewed all nursing notes, pharmacy notes, consultant notes,  vitals, pertinent old records  I have discussed plan of care as described above with RN , patient  on 11/01/2017   Florencia Reasons  MD, PhD  [facility] Pager [phone]. If 7PM-7AM, please contact night-coverage at [email or web address], password [facility] 11/01/2017, 8:33 PM  LOS: 0 days

## 2017-11-01 NOTE — Progress Notes (Signed)
Spoke to [employer] on phone-No pcp-will provide [facility] pcp listing.Await PT recc.

## 2017-11-01 NOTE — Evaluation (Signed)
Physical Therapy Evaluation Patient Details Name: Lisa Sawyer MRN: [id number] DOB: 09/01/1931 Today's Date: 11/01/2017   History of Present Illness  84 yo female admitted with chest pain, dyspnea. Hx of breast cancer, dementia, OA.   Clinical Impression  On eval, pt was Min assist for mobility. She walked ~75 feet without an assistive device (pt would not use quad cane that was in her room when asked to). O2 sat 93% on RA during session. She is unsteady. A&O x1. No family present during session. Recommend SNF unless pt has 24/7 care at home and if family is agreeable.     Follow Up Recommendations SNF;Supervision/Assistance - 24 hour (unless pt has 24/7 care at home)    Equipment Recommendations  None recommended by PT    Recommendations for Other Services       Precautions / Restrictions Precautions Precautions: Fall Restrictions Weight Bearing Restrictions: No      Mobility  Bed Mobility               General bed mobility comments: oob in recliner  Transfers Overall transfer level: Needs assistance Equipment used: Rolling walker (2 wheeled) Transfers: Sit to/from Stand Sit to Stand: Min guard         General transfer comment: close guard for safety. VCs safety.   Ambulation/Gait Ambulation/Gait assistance: Min assist Ambulation Distance (Feet): 75 Feet Assistive device: None Gait Pattern/deviations: Decreased stride length     General Gait Details: Attempted to have pt use quad cane-she would not use it. Walked a short distance to bathroom and then in hallway. O2 sat 93% on RA. Pt is unsteady. She did c/o knee pain with activity  Stairs            Wheelchair Mobility    Modified Rankin (Stroke Patients Only)       Balance Overall balance assessment: Needs assistance           Standing balance-Leahy Scale: Fair                               Pertinent Vitals/Pain Pain Assessment: Faces Faces Pain Scale: Hurts little  more Pain Location: knee(s) with ambulation Pain Descriptors / Indicators: Discomfort;Sore;Grimacing Pain Intervention(s): Limited activity within patient's tolerance    Home Living Family/patient expects to be discharged to:: Unsure Living Arrangements: Alone Available Help at Discharge: Family;Available PRN/intermittently             Additional Comments: pt unable to provide any PLOF info    Prior Function Level of Independence: Independent with assistive device(s)         Comments: quad cane in room-unsure if pt uses it consistently     Hand Dominance        Extremity/Trunk Assessment   Upper Extremity Assessment Upper Extremity Assessment: Generalized weakness    Lower Extremity Assessment Lower Extremity Assessment: Generalized weakness       Communication   Communication: No difficulties  Cognition Arousal/Alertness: Awake/alert Behavior During Therapy: WFL for tasks assessed/performed Overall Cognitive Status: History of cognitive impairments - at baseline                                        General Comments      Exercises     Assessment/Plan    PT Assessment Patient needs continued PT services  PT  Problem List Decreased activity tolerance;Decreased mobility;Decreased strength;Decreased balance;Pain;Decreased cognition;Decreased knowledge of use of DME       PT Treatment Interventions DME instruction;Gait training;Functional mobility training;Therapeutic activities;Balance training;Patient/family education;Therapeutic exercise    PT Goals (Current goals can be found in the Care Plan section)  Acute Rehab PT Goals Patient Stated Goal: none stated.  PT Goal Formulation: Patient unable to participate in goal setting Time For Goal Achievement: 11/15/17 Potential to Achieve Goals: Fair    Frequency Min 3X/week   Barriers to discharge        Co-evaluation               AM-PAC PT "6 Clicks" Daily Activity   Outcome Measure Difficulty turning over in bed (including adjusting bedclothes, sheets and blankets)?: A Little Difficulty moving from lying on back to sitting on the side of the bed? : A Little Difficulty sitting down on and standing up from a chair with arms (e.g., wheelchair, bedside commode, etc,.)?: A Little Help needed moving to and from a bed to chair (including a wheelchair)?: A Little Help needed walking in hospital room?: A Little Help needed climbing 3-5 steps with a railing? : A Lot 6 Click Score: 17    End of Session Equipment Utilized During Treatment: Gait belt Activity Tolerance: Patient tolerated treatment well Patient left: in chair;with call bell/phone within reach;with chair alarm set;with family/visitor present   PT Visit Diagnosis: Muscle weakness (generalized) (M62.81);Difficulty in walking, not elsewhere classified (R26.2)    Time: 8889-1694 PT Time Calculation (min) (ACUTE ONLY): 15 min   Charges:   PT Evaluation $PT Eval Moderate Complexity: 1 Mod     PT G Codes:          Weston Anna, MPT Pager: [phone]

## 2017-11-02 DIAGNOSIS — R0602 Shortness of breath: Secondary | ICD-10-CM | POA: Diagnosis not present

## 2017-11-02 DIAGNOSIS — I160 Hypertensive urgency: Secondary | ICD-10-CM | POA: Diagnosis not present

## 2017-11-02 DIAGNOSIS — R079 Chest pain, unspecified: Secondary | ICD-10-CM | POA: Diagnosis not present

## 2017-11-02 LAB — CBC WITH DIFFERENTIAL/PLATELET
BASOS ABS: 0 10*3/uL (ref 0.0–0.1)
BASOS PCT: 1 %
Eosinophils Absolute: 0.2 10*3/uL (ref 0.0–0.7)
Eosinophils Relative: 3 %
HEMATOCRIT: 36.1 % (ref 36.0–46.0)
Hemoglobin: 11.6 g/dL — ABNORMAL LOW (ref 12.0–15.0)
Lymphocytes Relative: 28 %
Lymphs Abs: 2 10*3/uL (ref 0.7–4.0)
MCH: 30.4 pg (ref 26.0–34.0)
MCHC: 32.1 g/dL (ref 30.0–36.0)
MCV: 94.8 fL (ref 78.0–100.0)
Monocytes Absolute: 0.6 10*3/uL (ref 0.1–1.0)
Monocytes Relative: 8 %
NEUTROS ABS: 4.6 10*3/uL (ref 1.7–7.7)
Neutrophils Relative %: 60 %
PLATELETS: 274 10*3/uL (ref 150–400)
RBC: 3.81 MIL/uL — AB (ref 3.87–5.11)
RDW: 13.4 % (ref 11.5–15.5)
WBC: 7.4 10*3/uL (ref 4.0–10.5)

## 2017-11-02 LAB — BASIC METABOLIC PANEL
ANION GAP: 10 (ref 5–15)
BUN: 21 mg/dL — ABNORMAL HIGH (ref 6–20)
CO2: 31 mmol/L (ref 22–32)
Calcium: 10 mg/dL (ref 8.9–10.3)
Chloride: 100 mmol/L — ABNORMAL LOW (ref 101–111)
Creatinine, Ser: 1.2 mg/dL — ABNORMAL HIGH (ref 0.44–1.00)
GFR calc non Af Amer: 40 mL/min — ABNORMAL LOW (ref >60)
GFR, EST AFRICAN AMERICAN: 47 mL/min — AB (ref >60)
GLUCOSE: 84 mg/dL (ref 65–99)
POTASSIUM: 3.5 mmol/L (ref 3.5–5.1)
Sodium: 141 mmol/L (ref 135–145)

## 2017-11-02 LAB — LIPID PANEL
CHOL/HDL RATIO: 2 ratio
Cholesterol: 152 mg/dL (ref 0–200)
HDL: 76 mg/dL (ref >40)
LDL CALC: 69 mg/dL (ref 0–99)
Triglycerides: 35 mg/dL (ref <150)
VLDL: 7 mg/dL (ref 0–40)

## 2017-11-02 LAB — MAGNESIUM: Magnesium: 1.5 mg/dL — ABNORMAL LOW (ref 1.7–2.4)

## 2017-11-02 MED ORDER — AMLODIPINE BESYLATE 10 MG PO TABS: 10.0000 mg | ORAL_TABLET | Freq: Every day | ORAL | 0 refills | Status: DC

## 2017-11-02 MED ORDER — LEVOTHYROXINE SODIUM 112 MCG PO TABS: 112.0000 ug | ORAL_TABLET | Freq: Every day | ORAL | Status: DC

## 2017-11-02 MED ORDER — NITROGLYCERIN 0.4 MG SL SUBL: 0.4000 mg | SUBLINGUAL_TABLET | SUBLINGUAL | 0 refills | Status: AC | PRN

## 2017-11-02 MED ORDER — POTASSIUM CHLORIDE CRYS ER 20 MEQ PO TBCR
40.0000 meq | EXTENDED_RELEASE_TABLET | Freq: Once | ORAL | Status: AC
  Administered 2017-11-02: 40 meq via ORAL
  Filled 2017-11-02: qty 2

## 2017-11-02 MED ORDER — MAGNESIUM SULFATE 4 GM/100ML IV SOLN
4.0000 g | Freq: Once | INTRAVENOUS | Status: AC
  Administered 2017-11-02: 4 g via INTRAVENOUS
  Filled 2017-11-02: qty 100

## 2017-11-02 MED ORDER — AMLODIPINE BESYLATE 5 MG PO TABS
5.0000 mg | ORAL_TABLET | Freq: Once | ORAL | Status: AC
  Administered 2017-11-02: 5 mg via ORAL
  Filled 2017-11-02: qty 1

## 2017-11-02 MED ORDER — HYDRALAZINE HCL 50 MG PO TABS: 50.0000 mg | ORAL_TABLET | Freq: Two times a day (BID) | ORAL | 0 refills | Status: DC

## 2017-11-02 MED ORDER — AMLODIPINE BESYLATE 10 MG PO TABS: 10.0000 mg | ORAL_TABLET | Freq: Every day | ORAL | Status: DC

## 2017-11-02 MED ORDER — CARVEDILOL 6.25 MG PO TABS: 6.2500 mg | ORAL_TABLET | Freq: Two times a day (BID) | ORAL | 0 refills | Status: DC

## 2017-11-02 NOTE — Progress Notes (Signed)
Dtr Karie Kirks called. Asked her to call SW. Karie Kirks refused to call CSW because she does not want the pt ,her mother, to go to SNF. Updated CSW [place].

## 2017-11-02 NOTE — Progress Notes (Signed)
Progress Note  Patient Name: Lisa Sawyer Date of Encounter: 11/02/2017  Primary Cardiologist: Dr. Oval Linsey  Subjective   I woke her from sleep. She continues to deny chest pain and SOB. She remains pleasantly confused.  Inpatient Medications    Scheduled Meds: . amLODipine  5 mg Oral Daily  . aspirin EC  81 mg Oral QHS  . atorvastatin  10 mg Oral q1800  . carvedilol  6.25 mg Oral BID WC  . enoxaparin (LOVENOX) injection  30 mg Subcutaneous Q24H  . hydrALAZINE  50 mg Oral BID  . levothyroxine  125 mcg Oral QAC breakfast  . losartan  50 mg Oral Daily  . potassium chloride  40 mEq Oral Once  . sertraline  12.5 mg Oral Daily   Continuous Infusions: . magnesium sulfate 1 - 4 g bolus IVPB     PRN Meds: acetaminophen, nitroGLYCERIN, ondansetron (ZOFRAN) IV   Vital Signs    Vitals:   11/01/17 0528 11/01/17 1249 11/01/17 2053 11/02/17 0528  BP: (!) 160/73 (!) 175/63 125/76 (!) 160/53  Pulse: (!) 108 100 98 76  Resp: 18  18 18   Temp: 98.2 F (36.8 C)  98.2 F (36.8 C) 98.2 F (36.8 C)  TempSrc: Oral  Oral Oral  SpO2: 100%  98% 99%  Weight:      Height:        Intake/Output Summary (Last 24 hours) at 11/02/2017 0820 Last data filed at 11/02/2017 0600 Gross per 24 hour  Intake 1010 ml  Output -  Net 1010 ml   Filed Weights   10/30/17 1910 10/31/17 1416  Weight: 139 lb (63 kg) 133 lb 9.6 oz (60.6 kg)     Physical Exam   General: Well developed, thin  female appearing in no acute distress. Head: Normocephalic, atraumatic.  Neck: Supple without bruits, minimal JVD Lungs:  Resp regular and unlabored, CTA. Heart: RRR, S1, S2, no S3, S4, or murmur; no rub. Abdomen: Soft, non-tender, non-distended with normoactive bowel sounds. No hepatomegaly. No rebound/guarding. No obvious abdominal masses. Extremities: No clubbing, cyanosis, trace edema. Distal pedal pulses are 2+ bilaterally. Neuro: Alert and oriented X 3. Moves all extremities spontaneously. Psych: Normal  affect.  Labs    Chemistry Recent Labs  Lab 10/30/17 2005 10/31/17 1300 11/01/17 1256 11/02/17 0527  NA 140  --  138 141  K 4.2  --  3.6 3.5  CL 106  --  100* 100*  CO2 26  --  28 31  GLUCOSE 92  --  132* 84  BUN 18  --  18 21*  CREATININE 1.17* 1.06* 1.23* 1.20*  CALCIUM 9.8  --  10.1 10.0  GFRNONAA 42* 47* 39* 40*  GFRAA 48* 54* 45* 47*  ANIONGAP 8  --  10 10     Hematology Recent Labs  Lab 10/31/17 1300 11/01/17 1256 11/02/17 0527  WBC 6.5 6.7 7.4  RBC 3.18* 3.51* 3.81*  HGB 9.9* 10.9* 11.6*  HCT 30.7* 34.4* 36.1  MCV 96.5 98.0 94.8  MCH 31.1 31.1 30.4  MCHC 32.2 31.7 32.1  RDW 13.4 13.7 13.4  PLT 226 258 274    Cardiac Enzymes Recent Labs  Lab 10/30/17 2005 10/31/17 0230 10/31/17 1300 10/31/17 1907  TROPONINI <0.03 <0.03 0.06* 0.12*   No results for input(s): TROPIPOC in the last 168 hours.   BNP Recent Labs  Lab 10/31/17 0230  BNP 380.1*     DDimer  Recent Labs  Lab 10/31/17 0230  DDIMER 0.54*  Radiology    Nm Pulmonary Perf And Vent  Result Date: 10/31/2017 CLINICAL DATA:  Constant mid chest pressure for 2 days, chest pain worsened with deep breathing, shortness of breath for 2 days, elevated D-dimer, intermediate clinical probability for pulmonary embolism EXAM: NUCLEAR MEDICINE VENTILATION - PERFUSION LUNG SCAN TECHNIQUE: Ventilation images were obtained in multiple projections using inhaled aerosol Tc-61m DTPA. Perfusion images were obtained in multiple projections after intravenous injection of Tc-66m-MAA. RADIOPHARMACEUTICALS:  31.6 mCi of Tc-34m DTPA aerosol inhalation and 4.03 mCi Tc34m-MAA IV COMPARISON:  None Correlation: Chest radiograph 10/30/2017 FINDINGS: Ventilation: Central airway deposition of aerosol. Small peripheral ventilation defects in both lungs, particularly lateral LEFT lower lobe. Small amount of swallowed aerosol within the esophagus and stomach. Perfusion: Much better perfusion than ventilation peripherally.  Diminished perfusion peripherally in the lateral LEFT lower lobe though less severe than ventilatory finding. No additional segmental or subsegmental perfusion defects. Chest radiograph: Hyperinflated/emphysematous lungs consistent with COPD. IMPRESSION: Low probability for pulmonary embolism. Electronically Signed   By: Lavonia Dana M.D.   On: 10/31/2017 13:27     Telemetry    Sinus in the 60s - Personally Reviewed  ECG    No new tracings - Personally Reviewed  Cardiac Studies   Echo3/5/19 Study Conclusions - Left ventricle: The cavity size was normal. There was mild concentric hypertrophy. Systolic function was normal. The estimated ejection fraction was in the range of 60% to 65%. Wall motion was normal; there were no regional wall motion abnormalities. Doppler parameters are consistent with abnormal left ventricular relaxation (grade 1 diastolic dysfunction). Doppler parameters are consistent with elevated mean left atrial filling pressure. - Tricuspid valve: There was mild-moderate regurgitation directed centrally. - Pulmonary arteries: Systolic pressure was mildly increased. PA peak pressure: 41 mm Hg (S).   Echo 10/06/11: Study Conclusions - Left ventricle: The cavity size was normal. Wall thickness was increased in a pattern of mild LVH. Systolic function was normal. The estimated ejection fraction was in the range of 55% to 65%. Wall motion was normal; there were no regional wall motion abnormalities. Doppler parameters are consistent with abnormal left ventricular relaxation (grade 1 diastolic dysfunction). - Mitral valve: Mild regurgitation. - Atrial septum: No defect or patent foramen ovale was identified. - Tricuspid valve: Mild-moderate regurgitation. - Pulmonary arteries: PA peak pressure: 51mm Hg (S).    Patient Profile     84 y.o. female with a hx of HTN, HLD, breast cancer (s/p lumpectomy 2009), ?ITP, and anxietywho  is being seen for the chest pain and hypertension.   Assessment & Plan    1. Mildly elevated troponin, hypertensive urgency Troponin trended to 0.12. The pt continues to deny chest pain. No ischemic workup unless she starts having symptoms. Her dementia makes it difficult for her to communicate past and present symptoms. Coreg added yesterday to her norvasc, losartan, and hydralazine. Given her normal LVEF and grade 1 DD, will continue these medications. Pressures continue to be labile. HR in the 60s, will not increase coreg. Will continue losartan in the setting of CKD. BP goal is < 140-90. Will increase norvasc today to 10 mg. Continue to monitor.   2. Elevated creatinine, suspect CKD stage III Baseline creatinine in 2017 was 1.4-1.5, previously normal in 2014. sCr 1.20 today with creatinine clearance calculated at 33 ml/min.   2. Anemia - resolved Hb 11.6. No concern for blood loss anemia. This is near her baseline.   3. Sinus tachycardia - resolved Rates in the 60s on telemetry.  4. TSH  TSH yesterday is low. Follow up with her PCP for synthroid dose adjustments.   Signed, Tami Lin Rene Gonsoulin , PA-C 8:20 AM 11/02/2017 Pager: [phone]

## 2017-11-02 NOTE — Discharge Summary (Signed)
Discharge Summary  Lisa Sawyer YCX:[id number] DOB: 02-Dec-1931  PCP: System, Pcp Not In  Admit date: 10/31/2017 Discharge date: 11/02/2017  Time spent: >5mins, more than 50% time spent on coordination  Recommendations for Outpatient Follow-up:  1. F/u with PMD within a week  for hospital discharge follow up, repeat cbc/bmp at follow up. pmd to repeat tsh in 4-6 weeks 2. F/u with cardiology 3. F/u with neurology for dementia management  Discharge Diagnoses:  Active Hospital Problems   Diagnosis Date Noted  . Central chest pain 10/31/2017  . Hypertensive urgency 10/31/2017  . Shortness of breath 09/30/2011  . Anemia   . Malignant neoplasm of female breast ([place]) 02/07/2007  . Hypothyroidism 02/07/2007    Resolved Hospital Problems  No resolved problems to display.    Discharge Condition: stable  Diet recommendation: heart healthy  Filed Weights   10/30/17 1910 10/31/17 1416  Weight: 63 kg (139 lb) 60.6 kg (133 lb 9.6 oz)    History of present illness:  Patient coming from: Home.  Chief Complaint: Chest pressure and shortness of breath.  HPI: Lisa Sawyer is a 84 y.o. female with history of hypertension, hypothyroidism, hyperlipidemia and grade 2 diastolic dysfunction presents to the ER with complaint of chest pressure.  Patient states over the last 2-3 days patient has been experiencing shortness of breath on walking and since last afternoon started experiencing exertional chest pressure.  Patient chest pressure improved on sitting.  Denies any productive cough fever chills.  Denies any lower extremity edema.  Patient states she had ran out of some of the antihypertensives and has not been taking for the last week or so.  ED Course: In the ER patient blood pressure was found to be elevated.  Was ordered IV hydralazine and 1 dose of IV Lasix for the shortness of breath.  Chest x-ray does not show anything acute.  BNP was around 380.  EKG shows normal sinus rhythm and  troponin is negative.  Patient on sitting does not have any chest pressure.  Patient had just gone to the bathroom when she felt the chest pressure again.    Hospital Course:  Principal Problem:   Central chest pain Active Problems:   Malignant neoplasm of female breast ([facility])   Hypothyroidism   Anemia   Shortness of breath   Hypertensive urgency   Chest pain on presentation -BNP is 380 , troponin  peaked at 0.12  -Cardiology consulted, felt chest pain is from hypertension urgency -Blood pressure better controlled.She is chest pain-free at discharge -lvef 49-70% grade 1 diastolic dysfunction -Cardiology input appreciated, she is cleared to discharge from the hospital and follow up with cardiology in the clinic.  Confusion with baseline dementia -Per friend at baseline she is not oriented to time or place, but normally patient is not agitated. -No source of infection at the identified. -sHe does look dehydrated on presentation, lasix held. -she is calm and pleasantly confused at discharge  Hypomagnesemia  S/p iv magnesium supplement in the hospital  CKD 3, renal function appears baseline  Anemia  hgb at baseline  Code Status: full  Family Communication: patient   Disposition Plan:  home with home health, family declined snf cardiology clearance for discharge   Consultants:  Cardiology  Procedures:  None  Antibiotics:  None    Discharge Exam: BP (!) 160/53 (BP Location: Right Arm)   Pulse 76   Temp 98.2 F (36.8 C) (Oral)   Resp 18   Ht 5\' 3"  (  1.6 m)   Wt 60.6 kg (133 lb 9.6 oz)   SpO2 99%   BMI 23.67 kg/m   General: NAD, pleasantly demented, she is oriented to person only Cardiovascular: RRR Respiratory: CTABL  Discharge Instructions You were cared for by a hospitalist during your hospital stay. If you have any questions about your discharge medications or the care you received while you were in the hospital after you are  discharged, you can call the unit and asked to speak with the hospitalist on call if the hospitalist that took care of you is not available. Once you are discharged, your primary care physician will handle any further medical issues. Please note that NO REFILLS for any discharge medications will be authorized once you are discharged, as it is imperative that you return to your primary care physician (or establish a relationship with a primary care physician if you do not have one) for your aftercare needs so that they can reassess your need for medications and monitor your lab values.  Discharge Instructions    Diet - low sodium heart healthy   Complete by:  As directed    Increase activity slowly   Complete by:  As directed      Allergies as of 11/02/2017      Reactions   Naprosyn [naproxen] Itching      Medication List    TAKE these medications   amLODipine 10 MG tablet Commonly known as:  NORVASC Take 1 tablet (10 mg total) by mouth daily. Start taking on:  11/03/2017   aspirin EC 81 MG tablet Take 81 mg by mouth at bedtime.   atorvastatin 10 MG tablet Commonly known as:  LIPITOR Take 10 mg by mouth daily.   calcium-vitamin D 500-200 MG-UNIT tablet Commonly known as:  OSCAL WITH D Take 1 tablet by mouth daily.   carvedilol 6.25 MG tablet Commonly known as:  COREG Take 1 tablet (6.25 mg total) by mouth 2 (two) times daily with a meal.   furosemide 40 MG tablet Commonly known as:  LASIX Take 40 mg by mouth daily.   hydrALAZINE 50 MG tablet Commonly known as:  APRESOLINE Take 1 tablet (50 mg total) by mouth 2 (two) times daily. What changed:    medication strength  how much to take   levothyroxine 112 MCG tablet Commonly known as:  SYNTHROID, LEVOTHROID Take 1 tablet (112 mcg total) by mouth daily before breakfast. What changed:  Another medication with the same name was removed. Continue taking this medication, and follow the directions you see here.   losartan 50  MG tablet Commonly known as:  COZAAR Take 50 mg by mouth daily.   mometasone 50 MCG/ACT nasal spray Commonly known as:  NASONEX instill 2 sprays into each nostril once daily What changed:    how much to take  how to take this  when to take this  reasons to take this  additional instructions   nitroGLYCERIN 0.4 MG SL tablet Commonly known as:  NITROSTAT Place 1 tablet (0.4 mg total) under the tongue every 5 (five) minutes as needed for chest pain.   sertraline 25 MG tablet Commonly known as:  ZOLOFT Take 1/2 tablet by mouth every morning or as directed.      Allergies  Allergen Reactions  . Naprosyn [Naproxen] Itching   Follow-up Information    Barrett, Evelene Croon, PA-C Follow up on 11/14/2017.   Specialties:  Cardiology, Radiology Why:  Your follow up appointment will be on 11/14/17  at 0900am. Please arrive to your appointment at 0845am.  Contact information: [address] [place] [address] [phone]        follow up with primary care doctor Follow up in 1 week(s).   Why:  for hospital discharge follow up, repeat cbc/bmp at follow up. pmd to repeat thyroid function tsh in 4-6 weeks.       [facility] Follow up in 3 week(s).   Why:  for dementia management Contact information: [address]     [place] [id number] [phone]           The results of significant diagnostics from this hospitalization (including imaging, microbiology, ancillary and laboratory) are listed below for reference.    Significant Diagnostic Studies: Dg Chest 2 View  Result Date: 10/30/2017 CLINICAL DATA:  Dyspnea with central chest pain x2 days. History of breast cancer in 2009. EXAM: CHEST  2 VIEW COMPARISON:  05/16/2013 CXR, chest CT 08/15/2011 FINDINGS: The heart size and mediastinal contours are within normal limits. Tortuous atherosclerotic thoracic aorta. No aneurysm. The lungs appear hyperinflated  without acute pneumonic consolidation, pneumothorax or effusion. The visualized skeletal structures are unremarkable. IMPRESSION: Hyperinflated lungs.  Aortic atherosclerosis. Electronically Signed   By: Ashley Royalty M.D.   On: 10/30/2017 19:43   Nm Pulmonary Perf And Vent  Result Date: 10/31/2017 CLINICAL DATA:  Constant mid chest pressure for 2 days, chest pain worsened with deep breathing, shortness of breath for 2 days, elevated D-dimer, intermediate clinical probability for pulmonary embolism EXAM: NUCLEAR MEDICINE VENTILATION - PERFUSION LUNG SCAN TECHNIQUE: Ventilation images were obtained in multiple projections using inhaled aerosol Tc-52m DTPA. Perfusion images were obtained in multiple projections after intravenous injection of Tc-34m-MAA. RADIOPHARMACEUTICALS:  31.6 mCi of Tc-82m DTPA aerosol inhalation and 4.03 mCi Tc68m-MAA IV COMPARISON:  None Correlation: Chest radiograph 10/30/2017 FINDINGS: Ventilation: Central airway deposition of aerosol. Small peripheral ventilation defects in both lungs, particularly lateral LEFT lower lobe. Small amount of swallowed aerosol within the esophagus and stomach. Perfusion: Much better perfusion than ventilation peripherally. Diminished perfusion peripherally in the lateral LEFT lower lobe though less severe than ventilatory finding. No additional segmental or subsegmental perfusion defects. Chest radiograph: Hyperinflated/emphysematous lungs consistent with COPD. IMPRESSION: Low probability for pulmonary embolism. Electronically Signed   By: Lavonia Dana M.D.   On: 10/31/2017 13:27    Microbiology: No results found for this or any previous visit (from the past 240 hour(s)).   Labs: Basic Metabolic Panel: Recent Labs  Lab 10/30/17 2005 10/31/17 1300 11/01/17 1256 11/02/17 0527  NA 140  --  138 141  K 4.2  --  3.6 3.5  CL 106  --  100* 100*  CO2 26  --  28 31  GLUCOSE 92  --  132* 84  BUN 18  --  18 21*  CREATININE 1.17* 1.06* 1.23* 1.20*    CALCIUM 9.8  --  10.1 10.0  MG  --   --  1.6* 1.5*   Liver Function Tests: No results for input(s): AST, ALT, ALKPHOS, BILITOT, PROT, ALBUMIN in the last 168 hours. No results for input(s): LIPASE, AMYLASE in the last 168 hours. No results for input(s): AMMONIA in the last 168 hours. CBC: Recent Labs  Lab 10/30/17 2005 10/31/17 1300 11/01/17 1256 11/02/17 0527  WBC 4.3 6.5 6.7 7.4  NEUTROABS  --   --   --  4.6  HGB 11.2* 9.9* 10.9* 11.6*  HCT 35.3* 30.7* 34.4* 36.1  MCV 97.8 96.5 98.0 94.8  PLT 244 226 258 274   Cardiac Enzymes: Recent Labs  Lab 10/30/17 2005 10/31/17 0230 10/31/17 1300 10/31/17 1907  TROPONINI <0.03 <0.03 0.06* 0.12*   BNP: BNP (last 3 results) Recent Labs    10/31/17 0230  BNP 380.1*    ProBNP (last 3 results) No results for input(s): PROBNP in the last 8760 hours.  CBG: No results for input(s): GLUCAP in the last 168 hours.     Signed:  Florencia Reasons MD, PhD  [facility] 11/02/2017, 2:25 PM

## 2017-11-02 NOTE — Progress Notes (Addendum)
CSW consulted for SNF placement. Per chart review, PT recommending SNF. CSW contacted patient's daughter Jasper Riling [phone]), no answer. CSW left voicemail requesting return phone call. Patient oriented x person and unable to participate in assessment. CSW will continue to try and reach patient's daughter.    Per chart review, patient's daughter's phone number [phone]) is incorrect. CSW contacted patient's daughter Jasper Riling [phone]), no answer. CSW left voicemail requesting return phone call. CSW will continue to try to reach patient's daughter to discuss discharge planning.  Abundio Miu, [place] Social Worker [facility] Cell#: [phone]

## 2017-11-02 NOTE — Progress Notes (Signed)
CSW informed by patient's RN that patient's daughter declined SNF. RNCM following to assist with home health needs. CSW signing off, no other needs identified at this time.   Abundio Miu, [place] Social Worker [facility] Cell#: [phone]

## 2017-11-02 NOTE — Progress Notes (Signed)
Pt discharged with daughter. Reviewed AVS with daughter using teachback method. No questions upon discharge. No apparent signs of distress

## 2017-11-02 NOTE — Progress Notes (Signed)
Medications sent to wrong pharmacy. Called pt pharm [facility] on [address]. They will call [facility] on [address] and have meds transferred. Updated pt dtr

## 2017-11-02 NOTE — Care Management Note (Signed)
Case Management Note  Patient Details  Name: Lisa Sawyer MRN: [id number] Date of Birth: Jul 01, 1932  Subjective/Objective:  Patient's dtr Rosalyn deferred to talk to grandson.Spoke to [place] c#[phone] d/c plans-[facility] for HHPT/aide/social worker rep Santiago Glad aware.Jermaine says Rosalyn(patient's dtr will be at home to give patient her meds so nursing is not needed)attending made aware. Jermaine elected [facility] for pcp.Karie Kirks will pick her mother up to transport home today.No further CM needs.                  Action/Plan:dj/c home w/HHC.   Expected Discharge Date:  11/02/17               Expected Discharge Plan:  [place]  In-House Referral:     Discharge planning Services  CM Consult  Post Acute Care Choice:    Choice offered to:  Adult Children  DME Arranged:    DME Agency:     HH Arranged:  PT, Nurse's Aide, Social Work [employer] Agency:  [place]  Status of Service:  Completed, signed off  If discussed at [employer], dates discussed:    Additional Comments:  Dessa Phi, RN 11/02/2017, 2:55 PM

## 2017-11-03 ENCOUNTER — Telehealth: Payer: Self-pay | Admitting: Physician Assistant

## 2017-11-14 ENCOUNTER — Encounter: Payer: Self-pay | Admitting: Physician Assistant

## 2017-11-14 ENCOUNTER — Ambulatory Visit (INDEPENDENT_AMBULATORY_CARE_PROVIDER_SITE_OTHER): Payer: Medicare Other | Admitting: Physician Assistant

## 2017-11-14 VITALS — BP 154/61 | HR 66 | Ht 63.0 in | Wt 141.2 lb

## 2017-11-14 DIAGNOSIS — I5032 Chronic diastolic (congestive) heart failure: Secondary | ICD-10-CM | POA: Diagnosis not present

## 2017-11-14 DIAGNOSIS — I10 Essential (primary) hypertension: Secondary | ICD-10-CM | POA: Diagnosis not present

## 2017-11-14 DIAGNOSIS — E039 Hypothyroidism, unspecified: Secondary | ICD-10-CM

## 2017-11-14 MED ORDER — LEVOTHYROXINE SODIUM 112 MCG PO TABS: 112.0000 ug | ORAL_TABLET | Freq: Every day | ORAL | 0 refills | Status: DC

## 2017-11-14 MED ORDER — SERTRALINE HCL 25 MG PO TABS: ORAL_TABLET | ORAL | 0 refills | Status: DC

## 2017-11-14 MED ORDER — ATORVASTATIN CALCIUM 10 MG PO TABS: 10.0000 mg | ORAL_TABLET | Freq: Every day | ORAL | 3 refills | Status: AC

## 2017-11-14 MED ORDER — LOSARTAN POTASSIUM 50 MG PO TABS: 50.0000 mg | ORAL_TABLET | Freq: Every day | ORAL | 3 refills | Status: DC

## 2017-11-14 MED ORDER — ATORVASTATIN CALCIUM 10 MG PO TABS: 10.0000 mg | ORAL_TABLET | Freq: Every day | ORAL | 3 refills | Status: DC

## 2017-11-14 MED ORDER — FUROSEMIDE 40 MG PO TABS: 40.0000 mg | ORAL_TABLET | Freq: Every day | ORAL | 3 refills | Status: DC

## 2017-11-14 NOTE — Progress Notes (Signed)
Cardiology Office Note   Date:  11/14/2017   ID:  Lisa Sawyer, DOB [birth year], MRN [id number]  PCP:  Patient, No Pcp Per  Cardiologist: Dr. Oval Linsey, 11/02/2017 in hospital [name], PA-C   Chief Complaint  Patient presents with  . Follow-up    2 weeks    History of Present Illness: Lisa Sawyer is a 84 y.o. female with a history of  HTN, HLD, breast cancer (s/p lumpectomy 2009), ?ITP, dementia, and anxiety  Admitted 3/5-11/02/2017 for chest pain, blood pressure meds adjusted, 1 dose of IV Lasix for mild volume overload, seen by cardiology and EF normal with grade 1 diastolic dysfunction, Coreg added, other BP meds adjusted, no ischemic workup indicated unless she starts having consistent symptoms.  Lisa Sawyer presents for post hospital follow-up.  She is here with her daughter, who is assisting in her care.  Lisa Sawyer denies chest pain since leaving the hospital.  She notes some shortness of breath with exertion.  She does not answer questions very well, but her daughter is able to help with information.  They are trying to find her PCP.  They had been going to [facility], but the physician retired.  They were assigned to another MD, but that one is not taking new patients.  She has a few pills left of her Synthroid and is out of the Zoloft.  She is not sleeping well because of being out of the Zoloft.  She has not been taking Lasix because they do not have any.  They are also out of the atorvastatin and the Cozaar.  They have the other medications and she is compliant with them.  However, she has not had anything yet today except her thyroid pill.   Past Medical History:  Diagnosis Date  . Acute blood loss anemia    secondary to surgery, requiring blood transfusion  . Anemia   . Anxiety attack   . breast ca dx'd 2009   pt states pre ca; lumpectomy-lt w/ xrt  . History of breast cancer   . Hypercholesterolemia   . Hypertension   . Hypokalemia    treated  . Hypothyroidism   . Idiopathic thrombocytopenic purpura (HCC)    A questionable history of idiopathic thrombocytopenic purpura  . Leukocytosis   . Osteoarthritis    left knee  . Pyrexia   . Thrombocytopenia ([place])   . Thyroid disease     Past Surgical History:  Procedure Laterality Date  . BREAST LUMPECTOMY    . BREAST SURGERY    . CHOLECYSTECTOMY    . JOINT REPLACEMENT    . KNEE RECONSTRUCTION, MEDIAL PATELLAR FEMORAL LIGAMENT    . total knee      Current Outpatient Medications  Medication Sig Dispense Refill  . amLODipine (NORVASC) 10 MG tablet Take 1 tablet (10 mg total) by mouth daily. 30 tablet 0  . aspirin EC 81 MG tablet Take 81 mg by mouth at bedtime.     Marland Kitchen atorvastatin (LIPITOR) 10 MG tablet Take 10 mg by mouth daily.    . calcium-vitamin D (OSCAL WITH D) 500-200 MG-UNIT per tablet Take 1 tablet by mouth daily.    . carvedilol (COREG) 6.25 MG tablet Take 1 tablet (6.25 mg total) by mouth 2 (two) times daily with a meal. 60 tablet 0  . furosemide (LASIX) 40 MG tablet Take 40 mg by mouth daily.    . hydrALAZINE (APRESOLINE) 50 MG tablet Take 1 tablet (50 mg  total) by mouth 2 (two) times daily. 60 tablet 0  . levothyroxine (SYNTHROID, LEVOTHROID) 112 MCG tablet Take 1 tablet (112 mcg total) by mouth daily before breakfast. 30 tablet 0  . losartan (COZAAR) 50 MG tablet Take 50 mg by mouth daily.    . mometasone (NASONEX) 50 MCG/ACT nasal spray instill 2 sprays into each nostril once daily (Patient taking differently: Place 2 sprays into the nose daily as needed (for allergies). ) 17 g 2  . nitroGLYCERIN (NITROSTAT) 0.4 MG SL tablet Place 1 tablet (0.4 mg total) under the tongue every 5 (five) minutes as needed for chest pain. 30 tablet 0  . sertraline (ZOLOFT) 25 MG tablet Take 1/2 tablet by mouth every morning or as directed. 90 tablet 0   No current facility-administered medications for this visit.     Allergies:   Naprosyn [naproxen]    Social History:  The  patient  reports that she has quit smoking. she has never used smokeless tobacco. She reports that she does not drink alcohol or use drugs.   Family History:  The patient's family history includes Cancer in her mother; Heart disease in her mother and sister.    ROS:  Please see the history of present illness. All other systems are reviewed and negative.    PHYSICAL EXAM: VS:  BP (!) 154/61   Pulse 66   Ht 5\' 3"  (1.6 m)   Wt 141 lb 3.2 oz (64 kg)   BMI 25.01 kg/m  , BMI Body mass index is 25.01 kg/m. GEN: Well nourished, well developed, female in no acute distress  HEENT: normal for age  Neck: JVD 8-9 cm, no carotid bruit, no masses Cardiac: RRR; 2/6 murmur, no rubs, or gallops Respiratory:  clear to auscultation bilaterally, normal work of breathing GI: soft, nontender, nondistended, + BS MS: no deformity or atrophy;  1+ edema; distal pulses are 2+ in all 4 extremities   Skin: warm and dry, no rash Neuro:  Strength and sensation are intact Psych: euthymic mood, full affect   EKG:  EKG is not ordered today.  Echo3/5/19 Study Conclusions - Left ventricle: The cavity size was normal. There was mild concentric hypertrophy. Systolic function was normal. The estimated ejection fraction was in the range of 60% to 65%. Wall motion was normal; there were no regional wall motion abnormalities. Doppler parameters are consistent with abnormal left ventricular relaxation (grade 1 diastolic dysfunction). Doppler parameters are consistent with elevated mean left atrial filling pressure. - Tricuspid valve: There was mild-moderate regurgitation directed centrally. - Pulmonary arteries: Systolic pressure was mildly increased. PA peak pressure: 41 mm Hg (S).    Recent Labs: 10/31/2017: B Natriuretic Peptide 380.1 11/01/2017: TSH 0.029 11/02/2017: BUN 21; Creatinine, Ser 1.20; Hemoglobin 11.6; Magnesium 1.5; Platelets 274; Potassium 3.5; Sodium 141    Lipid Panel      Component Value Date/Time   CHOL 152 11/02/2017 0527   TRIG 35 11/02/2017 0527   HDL 76 11/02/2017 0527   CHOLHDL 2.0 11/02/2017 0527   VLDL 7 11/02/2017 0527   LDLCALC 69 11/02/2017 0527     Wt Readings from Last 3 Encounters:  11/14/17 141 lb 3.2 oz (64 kg)  10/31/17 133 lb 9.6 oz (60.6 kg)  06/18/13 139 lb (63 kg)     Other studies Reviewed: Additional studies/ records that were reviewed today include: Hospital records and testing.  ASSESSMENT AND PLAN:  1.  Chronic diastolic CHF: Her volume status is up some, but she has not  been taking her Lasix.  Restart this.  She has some volume overload by exam, but she will need to be assessed when she is taking the Lasix regularly to see if her current dose is enough.  The family is requested to contact us if she has problems with muscle cramping or being lightheaded when she stands up.  Follow-up in a month with a BMET.  2.  Hypertension: Blood pressure is elevated today, but she has not had her medications.  I will ask her to restart her medications and encouraged compliance with them.  3.  Hypothyroid and depression: She is out of the Zoloft and has only a few pills left of the Synthroid.  I advised her I would give her a 30-day supply of both, but emphasized that cardiology will not refill these.  She is requested to contact [facility] and obtain a PCP.   Current medicines are reviewed at length with the patient today.  The patient has concerns regarding medicines.  Concerns were addressed  The following changes have been made: Cardiac drugs refilled, Synthroid and Zoloft refilled for a month  Labs/ tests ordered today include:  No orders of the defined types were placed in this encounter.   Disposition:   FU with Dr. Oval Linsey  Signed, [name], PA-C  11/14/2017 10:29 AM    [facility] Phone: [phone]; Fax: [phone]  This note was written with the assistance of speech  recognition software. Please excuse any transcriptional errors.

## 2017-11-14 NOTE — Patient Instructions (Addendum)
Medication Instructions:  Your physician recommends that you continue on your current medications as directed. Please refer to the Current Medication list given to you today.  Labwork: 2-4 weeks (BMET)  Follow-Up: 2-4 weeks with Dr. Oval Linsey or [name] PA  Any Other Special Instructions Will Be Listed Below (If Applicable).     If you need a refill on your cardiac medications before your next appointment, please call your pharmacy.

## 2017-11-25 ENCOUNTER — Ambulatory Visit: Payer: Medicare Other | Admitting: Family Medicine

## 2017-11-27 ENCOUNTER — Ambulatory Visit: Payer: Medicare Other | Admitting: Family Medicine

## 2017-11-27 NOTE — Progress Notes (Deleted)
Cardiology Office Note   Date:  11/27/2017   ID:  MEKLIT COTTA, DOB [birth year], MRN [id number]  PCP:  Patient, No Pcp Per  Cardiologist: Dr. Oval Linsey, 11/02/2017 in hospital [name], PA-C 11/14/2017  No chief complaint on file.   History of Present Illness: Lisa Sawyer is a 84 y.o. female with a history of HTN, HLD, breast cancer (s/p lumpectomy 2009), ?ITP, dementia, and anxiety,   Admitted 3/5-11/02/2017 for chest pain, IV Lasix x 1 dose, BP meds adjusted, no ischemic eval unless more sx 03/19 office visit, pt given 1 month of Zoloft and Synthroid till they can get a PCP, BP elevated but pt had not taken rx yet  Lisa Sawyer presents for ***   Past Medical History:  Diagnosis Date  . Acute blood loss anemia    secondary to surgery, requiring blood transfusion  . Anemia   . Anxiety attack   . breast ca dx'd 2009   pt states pre ca; lumpectomy-lt w/ xrt  . History of breast cancer   . Hypercholesterolemia   . Hypertension   . Hypokalemia    treated  . Hypothyroidism   . Idiopathic thrombocytopenic purpura (HCC)    A questionable history of idiopathic thrombocytopenic purpura  . Leukocytosis   . Osteoarthritis    left knee  . Pyrexia   . Thrombocytopenia ([place])   . Thyroid disease     Past Surgical History:  Procedure Laterality Date  . BREAST LUMPECTOMY    . BREAST SURGERY    . CHOLECYSTECTOMY    . JOINT REPLACEMENT    . KNEE RECONSTRUCTION, MEDIAL PATELLAR FEMORAL LIGAMENT    . total knee      Current Outpatient Medications  Medication Sig Dispense Refill  . amLODipine (NORVASC) 10 MG tablet Take 1 tablet (10 mg total) by mouth daily. 30 tablet 0  . aspirin EC 81 MG tablet Take 81 mg by mouth at bedtime.     Marland Kitchen atorvastatin (LIPITOR) 10 MG tablet Take 1 tablet (10 mg total) by mouth daily. 90 tablet 3  . calcium-vitamin D (OSCAL WITH D) 500-200 MG-UNIT per tablet Take 1 tablet by mouth daily.    . carvedilol (COREG) 6.25 MG tablet Take 1  tablet (6.25 mg total) by mouth 2 (two) times daily with a meal. 60 tablet 0  . furosemide (LASIX) 40 MG tablet Take 1 tablet (40 mg total) by mouth daily. 90 tablet 3  . hydrALAZINE (APRESOLINE) 50 MG tablet Take 1 tablet (50 mg total) by mouth 2 (two) times daily. 60 tablet 0  . levothyroxine (SYNTHROID, LEVOTHROID) 112 MCG tablet Take 1 tablet (112 mcg total) by mouth daily before breakfast. 30 tablet 0  . losartan (COZAAR) 50 MG tablet Take 1 tablet (50 mg total) by mouth daily. 90 tablet 3  . mometasone (NASONEX) 50 MCG/ACT nasal spray instill 2 sprays into each nostril once daily (Patient taking differently: Place 2 sprays into the nose daily as needed (for allergies). ) 17 g 2  . nitroGLYCERIN (NITROSTAT) 0.4 MG SL tablet Place 1 tablet (0.4 mg total) under the tongue every 5 (five) minutes as needed for chest pain. 30 tablet 0  . sertraline (ZOLOFT) 25 MG tablet Take 1/2 tablet by mouth every morning or as directed. 15 tablet 0   No current facility-administered medications for this visit.     Allergies:   Naprosyn [naproxen]    Social History:  The patient  reports that she has quit  smoking. She has never used smokeless tobacco. She reports that she does not drink alcohol or use drugs.   Family History:  The patient's family history includes Cancer in her mother; Heart disease in her mother and sister.    ROS:  Please see the history of present illness. All other systems are reviewed and negative.    PHYSICAL EXAM: VS:  There were no vitals taken for this visit. , BMI There is no height or weight on file to calculate BMI. GEN: Well nourished, well developed, female in no acute distress  HEENT: normal for age  Neck: no JVD, no carotid bruit, no masses Cardiac: RRR; no murmur, no rubs, or gallops Respiratory:  clear to auscultation bilaterally, normal work of breathing GI: soft, nontender, nondistended, + BS MS: no deformity or atrophy; no edema; distal pulses are 2+ in all 4  extremities   Skin: warm and dry, no rash Neuro:  Strength and sensation are intact Psych: euthymic mood, full affect   EKG:  EKG {ACTION; IS/IS CBS:49675916} ordered today. The ekg ordered today demonstrates ***  Echo3/5/19 Study Conclusions - Left ventricle: The cavity size was normal. There was mild concentric hypertrophy. Systolic function was normal. The estimated ejection fraction was in the range of 60% to 65%. Wall motion was normal; there were no regional wall motion abnormalities. Doppler parameters are consistent with abnormal left ventricular relaxation (grade 1 diastolic dysfunction). Doppler parameters are consistent with elevated mean left atrial filling pressure. - Tricuspid valve: There was mild-moderate regurgitation directed centrally. - Pulmonary arteries: Systolic pressure was mildly increased. PA peak pressure: 41 mm Hg (S)   Recent Labs: 10/31/2017: B Natriuretic Peptide 380.1 11/01/2017: TSH 0.029 11/02/2017: BUN 21; Creatinine, Ser 1.20; Hemoglobin 11.6; Magnesium 1.5; Platelets 274; Potassium 3.5; Sodium 141    Lipid Panel    Component Value Date/Time   CHOL 152 11/02/2017 0527   TRIG 35 11/02/2017 0527   HDL 76 11/02/2017 0527   CHOLHDL 2.0 11/02/2017 0527   VLDL 7 11/02/2017 0527   LDLCALC 69 11/02/2017 0527     Wt Readings from Last 3 Encounters:  11/14/17 141 lb 3.2 oz (64 kg)  10/31/17 133 lb 9.6 oz (60.6 kg)  06/18/13 139 lb (63 kg)     Other studies Reviewed: Additional studies/ records that were reviewed today include: ***.  ASSESSMENT AND PLAN:  1.  ***   Current medicines are reviewed at length with the patient today.  The patient {ACTIONS; HAS/DOES NOT HAVE:19233} concerns regarding medicines.  The following changes have been made:  {PLAN; NO CHANGE:13088:s}  Labs/ tests ordered today include: *** No orders of the defined types were placed in this encounter.    Disposition:   FU with Dr.  Oval Linsey  Signed, [name], PA-C  11/27/2017 4:46 PM    [facility] Phone: [phone]; Fax: [phone]  This note was written with the assistance of speech recognition software. Please excuse any transcriptional errors.

## 2017-11-27 NOTE — Progress Notes (Deleted)
No chief complaint on file.   HPI  4 review of systems  Past Medical History:  Diagnosis Date  . Acute blood loss anemia    secondary to surgery, requiring blood transfusion  . Anemia   . Anxiety attack   . breast ca dx'd 2009   pt states pre ca; lumpectomy-lt w/ xrt  . History of breast cancer   . Hypercholesterolemia   . Hypertension   . Hypokalemia    treated  . Hypothyroidism   . Idiopathic thrombocytopenic purpura (HCC)    A questionable history of idiopathic thrombocytopenic purpura  . Leukocytosis   . Osteoarthritis    left knee  . Pyrexia   . Thrombocytopenia ([place])   . Thyroid disease     Current Outpatient Medications  Medication Sig Dispense Refill  . amLODipine (NORVASC) 10 MG tablet Take 1 tablet (10 mg total) by mouth daily. 30 tablet 0  . aspirin EC 81 MG tablet Take 81 mg by mouth at bedtime.     Marland Kitchen atorvastatin (LIPITOR) 10 MG tablet Take 1 tablet (10 mg total) by mouth daily. 90 tablet 3  . calcium-vitamin D (OSCAL WITH D) 500-200 MG-UNIT per tablet Take 1 tablet by mouth daily.    . carvedilol (COREG) 6.25 MG tablet Take 1 tablet (6.25 mg total) by mouth 2 (two) times daily with a meal. 60 tablet 0  . furosemide (LASIX) 40 MG tablet Take 1 tablet (40 mg total) by mouth daily. 90 tablet 3  . hydrALAZINE (APRESOLINE) 50 MG tablet Take 1 tablet (50 mg total) by mouth 2 (two) times daily. 60 tablet 0  . levothyroxine (SYNTHROID, LEVOTHROID) 112 MCG tablet Take 1 tablet (112 mcg total) by mouth daily before breakfast. 30 tablet 0  . losartan (COZAAR) 50 MG tablet Take 1 tablet (50 mg total) by mouth daily. 90 tablet 3  . mometasone (NASONEX) 50 MCG/ACT nasal spray instill 2 sprays into each nostril once daily (Patient taking differently: Place 2 sprays into the nose daily as needed (for allergies). ) 17 g 2  . nitroGLYCERIN (NITROSTAT) 0.4 MG SL tablet Place 1 tablet (0.4 mg total) under the tongue every 5 (five) minutes as needed for chest pain. 30 tablet 0    . sertraline (ZOLOFT) 25 MG tablet Take 1/2 tablet by mouth every morning or as directed. 15 tablet 0   No current facility-administered medications for this visit.     Allergies:  Allergies  Allergen Reactions  . Naprosyn [Naproxen] Itching    Past Surgical History:  Procedure Laterality Date  . BREAST LUMPECTOMY    . BREAST SURGERY    . CHOLECYSTECTOMY    . JOINT REPLACEMENT    . KNEE RECONSTRUCTION, MEDIAL PATELLAR FEMORAL LIGAMENT    . total knee      Social History   Socioeconomic History  . Marital status: Divorced    Spouse name: Not on file  . Number of children: Not on file  . Years of education: Not on file  . Highest education level: Not on file  Occupational History  . Not on file  Social Needs  . Financial resource strain: Not on file  . Food insecurity:    Worry: Not on file    Inability: Not on file  . Transportation needs:    Medical: Not on file    Non-medical: Not on file  Tobacco Use  . Smoking status: Former Research scientist (life sciences)  . Smokeless tobacco: Never Used  Substance and Sexual Activity  .  Alcohol use: No  . Drug use: No  . Sexual activity: Not on file  Lifestyle  . Physical activity:    Days per week: Not on file    Minutes per session: Not on file  . Stress: Not on file  Relationships  . Social connections:    Talks on phone: Not on file    Gets together: Not on file    Attends religious service: Not on file    Active member of club or organization: Not on file    Attends meetings of clubs or organizations: Not on file    Relationship status: Not on file  Other Topics Concern  . Not on file  Social History Narrative  . Not on file    Family History  Problem Relation Age of Onset  . Cancer Mother   . Heart disease Mother   . Heart disease Sister      ROS Review of Systems See HPI [place]: No fevers or chills No malaise No diaphoresis Skin: No rash or itching Eyes: no blurry vision, no double vision GU: no dysuria or  hematuria Neuro: no dizziness or headaches * all others reviewed and negative   Objective: There were no vitals filed for this visit.  Physical Exam  Assessment and Plan There are no diagnoses linked to this encounter.   Lisa Sawyer P [employer]

## 2017-11-28 ENCOUNTER — Telehealth: Payer: Self-pay | Admitting: Physician Assistant

## 2017-11-28 ENCOUNTER — Ambulatory Visit (INDEPENDENT_AMBULATORY_CARE_PROVIDER_SITE_OTHER): Payer: Medicare Other | Admitting: Physician Assistant

## 2017-11-28 ENCOUNTER — Other Ambulatory Visit: Payer: Self-pay

## 2017-11-28 ENCOUNTER — Encounter: Payer: Self-pay | Admitting: Physician Assistant

## 2017-11-28 ENCOUNTER — Ambulatory Visit: Payer: Medicare Other | Admitting: Physician Assistant

## 2017-11-28 VITALS — BP 106/61 | HR 67 | Temp 97.7°F | Resp 16 | Ht 61.81 in | Wt 133.4 lb

## 2017-11-28 DIAGNOSIS — I5032 Chronic diastolic (congestive) heart failure: Secondary | ICD-10-CM | POA: Diagnosis not present

## 2017-11-28 DIAGNOSIS — E039 Hypothyroidism, unspecified: Secondary | ICD-10-CM | POA: Diagnosis not present

## 2017-11-28 DIAGNOSIS — F0390 Unspecified dementia without behavioral disturbance: Secondary | ICD-10-CM

## 2017-11-28 DIAGNOSIS — N183 Chronic kidney disease, stage 3 unspecified: Secondary | ICD-10-CM

## 2017-11-28 DIAGNOSIS — I10 Essential (primary) hypertension: Secondary | ICD-10-CM | POA: Diagnosis not present

## 2017-11-28 DIAGNOSIS — F419 Anxiety disorder, unspecified: Secondary | ICD-10-CM | POA: Diagnosis not present

## 2017-11-28 MED ORDER — CARVEDILOL 6.25 MG PO TABS: 6.2500 mg | ORAL_TABLET | Freq: Two times a day (BID) | ORAL | 1 refills | Status: DC

## 2017-11-28 MED ORDER — SERTRALINE HCL 25 MG PO TABS
ORAL_TABLET | ORAL | 1 refills | Status: DC
Start: 2017-11-28 — End: 2018-02-14

## 2017-11-28 MED ORDER — AMLODIPINE BESYLATE 10 MG PO TABS: 10.0000 mg | ORAL_TABLET | Freq: Every day | ORAL | 1 refills | Status: DC

## 2017-11-28 MED ORDER — LEVOTHYROXINE SODIUM 112 MCG PO TABS: 112.0000 ug | ORAL_TABLET | Freq: Every day | ORAL | 1 refills | Status: DC

## 2017-11-28 NOTE — Patient Instructions (Addendum)
We have contacted cardiology and they have given you refills on all of your cardiac medications.  I have refilled your Synthroid and Zoloft for 6 months.  We have collected labs today and if there are any issues  or we need to change any of your medication doses, we will contact you.  Follow-up with cardiology as planned.  I have placed a referral for neurology for further evaluation of dementia.  They should contact you within 1-2 weeks.  Plan to follow-up here in 6 months or sooner if you develop new concerning symptoms.   IF you received an x-ray today, you will receive an invoice from [facility]. Please contact [facility] at [phone] with questions or concerns regarding your invoice.   IF you received labwork today, you will receive an invoice from [facility]. Please contact [facility] at [phone] with questions or concerns regarding your invoice.   Our billing staff will not be able to assist you with questions regarding bills from these companies.  You will be contacted with the lab results as soon as they are available. The fastest way to get your results is to activate your My Chart account. Instructions are located on the last page of this paperwork. If you have not heard from [place] regarding the results in 2 weeks, please contact this office.

## 2017-11-28 NOTE — Telephone Encounter (Signed)
°  New message  Vaughan Basta verbalized that she is calling for RN  To see if PCP is going to take over refills of heart medications or  If [facility] Pt is requesting 90 days on all medications due to insurance purposes     *STAT* If patient is at the pharmacy, call can be transferred to refill team.   1. Which medications need to be refilled? (please list name of each medication and dose if known)  carvedilol (COREG) 6.25 MG tablet Take 1 tablet (6.25 mg total) by mouth 2 (two) times daily with a meal.   atorvastatin (LIPITOR) 10 MG tablet Take 1 tablet (10 mg total) by mouth daily.    amLODipine (NORVASC) 10 MG tablet Take 1 tablet (10 mg total) by mouth daily.    2. Which pharmacy/location (including street and city if local pharmacy) is medication to be sent to?  [place], [place] [phone] (Phone) [phone] (Fax)    3. Do they need a 30 day or 90 day supply? [place]

## 2017-11-28 NOTE — Telephone Encounter (Signed)
Rx(s) sent to pharmacy electronically - coreg, amlodipine Atorvastatin was refilled at [facility]

## 2017-11-28 NOTE — Progress Notes (Signed)
Lisa Sawyer  MRN: [id number] DOB: 06-04-1932  Subjective:  Lisa Sawyer is a 84 y.o. female seen in office today for a chief complaint of hospital follow-up and establish care. Patient is to be seen here by Dr. Leward Quan but then moved to [place].   She moved back from [place] about 4 months ago.  She has not had a PCP since.  Chronic medical conditions include: Arthritis, anemia, anxiety, hypothyroidism, hypertension, CKD, and history of breast cancer.    Recently was seen in the ED about one month ago  for chest pain shortness of breath.  Admitted for HTN urgency. Normal EKG,  BNP elevated 380, troponin peaked at 0.12, echo showed grade 1 diastolic heart failure. Upon discharge, recommend she follow up with cardiology and neurology.  Today, pt's daughter notes they have seen cardiology on 11/14/17. Cards recommend follow up in 2-4 weeks and establish with PCP for refills of zoloft and synthroid. Patient's daughter who is accompanying here and is acting as caregiver is confused about the management of chronic medical conditions.   In terms of anxiety, patient's daughter feels like it is controlled on Zoloft.  She takes half a tablet of 25 mg tablet daily. Pt's daughter notes that pt does stay up late at night and does not sleep much.   In terms of hypothyroidism, patient has had diagnosis for years.  She has been on 112 mcg for quite some time.  TSH was low during hospital admission.  It was recommended repeat value in 4-6 weeks.   Of note, patient has history of dementia and was following neurology in [place] but has not seen one here.  Patient's baseline is confusion.    Review of Systems  Constitutional: Negative for chills, diaphoresis and fever.  Respiratory: Negative for cough, shortness of breath and wheezing.   Cardiovascular: Negative for chest pain and palpitations.  Neurological: Negative for dizziness and light-headedness.  Psychiatric/Behavioral: Positive for confusion.  Negative for agitation.    Patient Active Problem List   Diagnosis Date Noted  . Central chest pain 10/31/2017  . Hypertensive urgency 10/31/2017  . HTN (hypertension), malignant 05/16/2013  . Headache 05/16/2013  . Hyponatremia 05/16/2013  . Edema 02/21/2012  . Depression with anxiety 12/08/2011  . Shortness of breath 09/30/2011  . Palpitations 09/06/2011  . Anemia   . Thrombocytopenia ([place])   . Malignant neoplasm of female breast ([place]) 02/07/2007  . Hypothyroidism 02/07/2007  . HTN (hypertension), benign 02/07/2007  . DEGENERATIVE DISC DISEASE 02/07/2007    Current Outpatient Medications on File Prior to Visit  Medication Sig Dispense Refill  . amLODipine (NORVASC) 10 MG tablet Take 1 tablet (10 mg total) by mouth daily. 30 tablet 0  . aspirin EC 81 MG tablet Take 81 mg by mouth at bedtime.     Marland Kitchen atorvastatin (LIPITOR) 10 MG tablet Take 1 tablet (10 mg total) by mouth daily. 90 tablet 3  . calcium-vitamin D (OSCAL WITH D) 500-200 MG-UNIT per tablet Take 1 tablet by mouth daily.    . carvedilol (COREG) 6.25 MG tablet Take 1 tablet (6.25 mg total) by mouth 2 (two) times daily with a meal. 60 tablet 0  . furosemide (LASIX) 40 MG tablet Take 1 tablet (40 mg total) by mouth daily. 90 tablet 3  . hydrALAZINE (APRESOLINE) 50 MG tablet Take 1 tablet (50 mg total) by mouth 2 (two) times daily. 60 tablet 0  . levothyroxine (SYNTHROID, LEVOTHROID) 112 MCG tablet Take 1 tablet (112 mcg total)  by mouth daily before breakfast. 30 tablet 0  . losartan (COZAAR) 50 MG tablet Take 1 tablet (50 mg total) by mouth daily. 90 tablet 3  . mometasone (NASONEX) 50 MCG/ACT nasal spray instill 2 sprays into each nostril once daily (Patient taking differently: Place 2 sprays into the nose daily as needed (for allergies). ) 17 g 2  . nitroGLYCERIN (NITROSTAT) 0.4 MG SL tablet Place 1 tablet (0.4 mg total) under the tongue every 5 (five) minutes as needed for chest pain. 30 tablet 0  . sertraline (ZOLOFT) 25  MG tablet Take 1/2 tablet by mouth every morning or as directed. 15 tablet 0   No current facility-administered medications on file prior to visit.     Allergies  Allergen Reactions  . Naprosyn [Naproxen] Itching      Social History   Socioeconomic History  . Marital status: Divorced    Spouse name: Not on file  . Number of children: 4  . Years of education: Not on file  . Highest education level: Not on file  Occupational History  . Not on file  Social Needs  . Financial resource strain: Not on file  . Food insecurity:    Worry: Not on file    Inability: Not on file  . Transportation needs:    Medical: Not on file    Non-medical: Not on file  Tobacco Use  . Smoking status: Former Research scientist (life sciences)  . Smokeless tobacco: Never Used  Substance and Sexual Activity  . Alcohol use: No  . Drug use: No  . Sexual activity: Not on file  Lifestyle  . Physical activity:    Days per week: Not on file    Minutes per session: Not on file  . Stress: Not on file  Relationships  . Social connections:    Talks on phone: Not on file    Gets together: Not on file    Attends religious service: Not on file    Active member of club or organization: Not on file    Attends meetings of clubs or organizations: Not on file    Relationship status: Not on file  . Intimate partner violence:    Fear of current or ex partner: Not on file    Emotionally abused: Not on file    Physically abused: Not on file    Forced sexual activity: Not on file  Other Topics Concern  . Not on file  Social History Narrative  . Not on file    Objective:  BP 106/61 (BP Location: Right Arm)   Pulse 67   Temp 97.7 F (36.5 C) (Oral)   Resp 16   Ht 5' 1.81" (1.57 m)   Wt 133 lb 6.4 oz (60.5 kg)   SpO2 97%   BMI 24.55 kg/m   Physical Exam  Constitutional: She is well-developed, well-nourished, and in no distress.  HENT:  Head: Normocephalic and atraumatic.  Eyes: Conjunctivae are normal.  Neck: Normal range  of motion.  Cardiovascular: Normal rate, regular rhythm and intact distal pulses.  Murmur heard.  Systolic murmur is present. Pulmonary/Chest: Effort normal and breath sounds normal. She has no wheezes. She has no rhonchi. She has no rales.  Musculoskeletal:       Right lower leg: She exhibits edema (1+ to midshin).       Left lower leg: She exhibits edema (1+ to midshin). Swelling:    Neurological: She is alert. Gait normal.  Skin: Skin is warm and dry.  Psychiatric:  Affect normal.  Vitals reviewed.  No flowsheet data found.  GAD 7 : Generalized Anxiety Score 11/28/2017  Nervous, Anxious, on Edge 0  Control/stop worrying 0  Worry too much - different things 0  Trouble relaxing 1  Restless 0  Easily annoyed or irritable 0  Afraid - awful might happen 0  Total GAD 7 Score 1  Anxiety Difficulty Not difficult at all    6CIT Screen 11/28/2017  What Year? 4 points  What month? 3 points  What time? 3 points  Count back from 20 4 points  Months in reverse 4 points  Repeat phrase 10 points  Total Score 28    Assessment and Plan :  1. HTN (hypertension), benign 2. Chronic diastolic heart failure ([place]) Labs pending. CMA Vaughan Basta personally contacted [facility] and they agree to take over full management of HTN and heart failure medications. Therefore, pt will need to follow up with cardiology for cardiac meds and here for anxiety and thryoid medications. Explained this to daughter. She now has better understanding of proper management of her mother.  Plan to follow up with cardiology in 2 weeks for reevaluation. BP is controlled here and pt is asymptomatic. Follow up labs from hospital admission pending.  - CBC with Differential/Platelet - CMP14+EGFR  3. Hypothyroidism, unspecified type Labs pending. May alter dose depending on TSH results. Follow up here in 6 months.  - TSH - levothyroxine (SYNTHROID, LEVOTHROID) 112 MCG tablet; Take 1 tablet (112 mcg total) by mouth daily before  breakfast.  Dispense: 90 tablet; Refill: 1  4. Anxiety Recommend continuing with current dose.  - sertraline (ZOLOFT) 25 MG tablet; Take 1/2 tablet by mouth every morning or as directed.  Dispense: 90 tablet; Refill: 1  5. Dementia without behavioral disturbance, unspecified dementia type 6CIT screen score of 23. Pt's daughter notes this is her baseline. Referral to neurology placed for further evaluation and management of dementia - Ambulatory referral to Neurology  6. Stage 3 chronic kidney disease (HCC) - CMP14+EGFR  A total of 45 minutes was spent in the room with the patient, greater than 50% of which was in counseling/coordination of care regarding HTN, heart failure, anxiety, hypothyroidism, and dementia.  Tenna Delaine PA-C  [facility] 11/28/2017 1:36 PM

## 2017-11-29 ENCOUNTER — Encounter: Payer: Self-pay | Admitting: Physician Assistant

## 2017-11-29 LAB — CMP14+EGFR
ALK PHOS: 59 IU/L (ref 39–117)
ALT: 13 IU/L (ref 0–32)
AST: 15 IU/L (ref 0–40)
Albumin/Globulin Ratio: 1.7 (ref 1.2–2.2)
Albumin: 4.5 g/dL (ref 3.5–4.7)
BILIRUBIN TOTAL: 0.2 mg/dL (ref 0.0–1.2)
BUN/Creatinine Ratio: 23 (ref 12–28)
BUN: 36 mg/dL — ABNORMAL HIGH (ref 8–27)
CHLORIDE: 98 mmol/L (ref 96–106)
CO2: 28 mmol/L (ref 20–29)
Calcium: 10.3 mg/dL (ref 8.7–10.3)
Creatinine, Ser: 1.55 mg/dL — ABNORMAL HIGH (ref 0.57–1.00)
GFR calc non Af Amer: 31 mL/min/{1.73_m2} — ABNORMAL LOW (ref >59)
GFR, EST AFRICAN AMERICAN: 35 mL/min/{1.73_m2} — AB (ref >59)
GLUCOSE: 114 mg/dL — AB (ref 65–99)
Globulin, Total: 2.6 g/dL (ref 1.5–4.5)
POTASSIUM: 4.1 mmol/L (ref 3.5–5.2)
Sodium: 144 mmol/L (ref 134–144)
TOTAL PROTEIN: 7.1 g/dL (ref 6.0–8.5)

## 2017-11-29 LAB — CBC WITH DIFFERENTIAL/PLATELET
BASOS ABS: 0 10*3/uL (ref 0.0–0.2)
BASOS: 1 %
EOS (ABSOLUTE): 0.3 10*3/uL (ref 0.0–0.4)
Eos: 6 %
Hematocrit: 34.7 % (ref 34.0–46.6)
Hemoglobin: 11.3 g/dL (ref 11.1–15.9)
Immature Grans (Abs): 0 10*3/uL (ref 0.0–0.1)
Immature Granulocytes: 1 %
LYMPHS ABS: 1.4 10*3/uL (ref 0.7–3.1)
Lymphs: 27 %
MCH: 30.8 pg (ref 26.6–33.0)
MCHC: 32.6 g/dL (ref 31.5–35.7)
MCV: 95 fL (ref 79–97)
MONOS ABS: 0.4 10*3/uL (ref 0.1–0.9)
Monocytes: 7 %
NEUTROS ABS: 3.1 10*3/uL (ref 1.4–7.0)
Neutrophils: 58 %
PLATELETS: 337 10*3/uL (ref 150–379)
RBC: 3.67 x10E6/uL — ABNORMAL LOW (ref 3.77–5.28)
RDW: 13.4 % (ref 12.3–15.4)
WBC: 5.2 10*3/uL (ref 3.4–10.8)

## 2017-11-29 LAB — TSH: TSH: 0.047 u[IU]/mL — AB (ref 0.450–4.500)

## 2017-11-30 ENCOUNTER — Other Ambulatory Visit: Payer: Self-pay | Admitting: Physician Assistant

## 2017-11-30 MED ORDER — LEVOTHYROXINE SODIUM 100 MCG PO TABS: 100.0000 ug | ORAL_TABLET | Freq: Every day | ORAL | 1 refills | Status: DC

## 2017-11-30 NOTE — Progress Notes (Signed)
This was precepted with Dr. Pamella Pert.   Pt's daughter (caregiver) was contacted via phone. Discussed lab results and treatment plan with her. Creatinine has increased and GFR decreased since hospital visit, recommend d/c losartan and decreasing lasix to 20mg  daily. Check bp, goal is <140/90. Return Saturday (4/6) for repeat BMP (she needs to call first thing tomorrow to schedule an appointment for Saturday, which she understands). TSH also low, recommend d/c synthroid 112 mcg and start synthroid 100 mcg. New Rx sent to pharmacy. Will repeat TSH is 6 weeks. Pt's daughter wrote all the instructions down, read them back to me, they were correct. Will f/u Saturday.

## 2017-12-13 ENCOUNTER — Ambulatory Visit: Payer: Medicare Other | Admitting: Neurology

## 2017-12-26 ENCOUNTER — Ambulatory Visit (INDEPENDENT_AMBULATORY_CARE_PROVIDER_SITE_OTHER): Payer: Medicare Other | Admitting: Physician Assistant

## 2017-12-26 ENCOUNTER — Encounter: Payer: Self-pay | Admitting: Physician Assistant

## 2017-12-26 VITALS — BP 132/46 | HR 58 | Ht 62.0 in | Wt 136.0 lb

## 2017-12-26 DIAGNOSIS — I5032 Chronic diastolic (congestive) heart failure: Secondary | ICD-10-CM

## 2017-12-26 DIAGNOSIS — R0602 Shortness of breath: Secondary | ICD-10-CM | POA: Diagnosis not present

## 2017-12-26 DIAGNOSIS — I10 Essential (primary) hypertension: Secondary | ICD-10-CM | POA: Diagnosis not present

## 2017-12-26 NOTE — Progress Notes (Signed)
Cardiology Office Note   Date:  12/26/2017   ID:  Lisa Sawyer, DOB 1931-09-19, MRN [id number]  PCP:  Tenna Delaine, [facility]  Cardiologist: Dr. Oval Linsey, 11/02/2017 in hospital [name], PA-C 11/14/2017  Chief Complaint  Patient presents with  . Follow-up    issues with breathing, and gets tired easliy, involuntary movements with hands    History of Present Illness: Lisa Sawyer is a 84 y.o. female with a history of HTN, HLD, breast cancer (s/p lumpectomy 2009), ?ITP, dementia, and anxiety  11/28/2017 Welcome visit w/ B. Timmothy Euler, [facility] to establish care. They requested cards to manage her HTN and CHF.   Lisa Sawyer presents for cardiology follow up. She is here with a church member.  She gets DOE. She is able to climb a few steps but has to stop after about 3 steps. There is no recent change. She also has some MS pain that can limit her activity. She is not falling.   Her legs get tired when she moves around. Getting dressed makes her tired. She goes to doctors and goes to church. Does not otherwise get out much.  She cannot remember if her exertional ability has changed recently.  She never remembers getting chest pain. Her friend describes an uncomfortable feeling in the center of her chest. It happens at rest. Lisa Sawyer does not remember this.   Her daughter cooks the food she eats. Lisa Sawyer feels she is eating well.  Her daughter make sure she takes her medications.   Past Medical History:  Diagnosis Date  . Acute blood loss anemia    secondary to surgery, requiring blood transfusion  . Anemia   . Anxiety attack   . breast ca dx'd 2009   Lisa Sawyer states pre ca; lumpectomy-lt w/ xrt  . History of breast cancer   . Hypercholesterolemia   . Hypertension   . Hypokalemia    treated  . Hypothyroidism   . Idiopathic thrombocytopenic purpura (HCC)    A questionable history of idiopathic thrombocytopenic purpura  . Leukocytosis   . Osteoarthritis    left knee  . Pyrexia   . Thrombocytopenia ([place])   . Thyroid disease     Past Surgical History:  Procedure Laterality Date  . BREAST LUMPECTOMY    . BREAST SURGERY    . CHOLECYSTECTOMY    . JOINT REPLACEMENT    . KNEE RECONSTRUCTION, MEDIAL PATELLAR FEMORAL LIGAMENT    . total knee      Current Outpatient Medications  Medication Sig Dispense Refill  . amLODipine (NORVASC) 10 MG tablet Take 1 tablet (10 mg total) by mouth daily. 90 tablet 1  . aspirin EC 81 MG tablet Take 81 mg by mouth at bedtime.     Marland Kitchen atorvastatin (LIPITOR) 10 MG tablet Take 1 tablet (10 mg total) by mouth daily. 90 tablet 3  . calcium-vitamin D (OSCAL WITH D) 500-200 MG-UNIT per tablet Take 1 tablet by mouth daily.    . carvedilol (COREG) 6.25 MG tablet Take 1 tablet (6.25 mg total) by mouth 2 (two) times daily with a meal. 180 tablet 1  . furosemide (LASIX) 40 MG tablet Take 1/2 tablet ( 20 mg ) daily 90 tablet 3  . hydrALAZINE (APRESOLINE) 50 MG tablet Take 1 tablet (50 mg total) by mouth 2 (two) times daily. 60 tablet 0  . levothyroxine (SYNTHROID, LEVOTHROID) 100 MCG tablet Take 1 tablet (100 mcg total) by mouth daily before breakfast. 90  tablet 1  . mometasone (NASONEX) 50 MCG/ACT nasal spray instill 2 sprays into each nostril once daily (Patient taking differently: Place 2 sprays into the nose daily as needed (for allergies). ) 17 g 2  . nitroGLYCERIN (NITROSTAT) 0.4 MG SL tablet Place 1 tablet (0.4 mg total) under the tongue every 5 (five) minutes as needed for chest pain. 30 tablet 0  . sertraline (ZOLOFT) 25 MG tablet Take 1/2 tablet by mouth every morning or as directed. 90 tablet 1   No current facility-administered medications for this visit.     Allergies:   Naprosyn [naproxen]    Social History:  The patient  reports that she has quit smoking. She has never used smokeless tobacco. She reports that she does not drink alcohol or use drugs.   Family History:  The patient's family history includes  Cancer in her mother; Heart disease in her mother and sister.    ROS:  Please see the history of present illness. All other systems are reviewed and negative.    PHYSICAL EXAM: VS:  BP (!) 132/46   Pulse (!) 58   Ht 5\' 2"  (1.575 m)   Wt 136 lb (61.7 kg)   SpO2 96%   BMI 24.87 kg/m  , BMI Body mass index is 24.87 kg/m. GEN: Well nourished, well developed, female in no acute distress  HEENT: normal for age  Neck: Minimal JVD, no carotid bruit, no masses Cardiac: RRR; soft murmur, no rubs, or gallops Respiratory: Few rales bases but good air exchange bilaterally, normal work of breathing GI: soft, nontender, nondistended, + BS MS: no deformity or atrophy; 1-2+ lower extremity edema; distal pulses are 2+ in all 4 extremities   Skin: warm and dry, no rash Neuro:  Strength and sensation are intact Psych: euthymic mood, full affect   EKG:  EKG is not ordered today.  Echo3/5/19 Study Conclusions - Left ventricle: The cavity size was normal. There was mild concentric hypertrophy. Systolic function was normal. The estimated ejection fraction was in the range of 60% to 65%. Wall motion was normal; there were no regional wall motion abnormalities. Doppler parameters are consistent with abnormal left ventricular relaxation (grade 1 diastolic dysfunction). Doppler parameters are consistent with elevated mean left atrial filling pressure. - Tricuspid valve: There was mild-moderate regurgitation directed centrally. - Pulmonary arteries: Systolic pressure was mildly increased. PA peak pressure: 41 mm Hg (S).   Recent Labs: 10/31/2017: B Natriuretic Peptide 380.1 11/02/2017: Magnesium 1.5 11/28/2017: ALT 13; BUN 36; Creatinine, Ser 1.55; Hemoglobin 11.3; Platelets 337; Potassium 4.1; Sodium 144; TSH 0.047    Lipid Panel    Component Value Date/Time   CHOL 152 11/02/2017 0527   TRIG 35 11/02/2017 0527   HDL 76 11/02/2017 0527   CHOLHDL 2.0 11/02/2017 0527   VLDL 7  11/02/2017 0527   LDLCALC 69 11/02/2017 0527     Wt Readings from Last 3 Encounters:  12/26/17 136 lb (61.7 kg)  11/28/17 133 lb 6.4 oz (60.5 kg)  11/14/17 141 lb 3.2 oz (64 kg)     Other studies Reviewed: Additional studies/ records that were reviewed today include: Office notes, hospital records and testing.  ASSESSMENT AND PLAN:  1.  Chronic diastolic CHF: Continue Lasix 40 mg, 1/2 tablet daily. -She was taken off the losartan by her PCP because of worsened renal function. -Recheck a BMET today. -She still has some lower extremity edema, but I am not sure how much his daytime edema and how much she wakes  with. -Her weight is up 3 pounds since her Lasix dose was decreased -Need to make sure her daughter is recording her weight and it is remaining stable at home.  2.  Hypertension: Blood pressure is minimally above target of 130/80, no med changes  Current medicines are reviewed at length with the patient today.  The patient does not have concerns regarding medicines.  The following changes have been made:  no change  Labs/ tests ordered today include:   Orders Placed This Encounter  Procedures  . Basic metabolic panel  . Basic metabolic panel     Disposition:   FU with Dr. Oval Linsey  Signed, [name], PA-C  12/26/2017 5:29 PM    [facility] Phone: [phone]; Fax: [phone]  This note was written with the assistance of speech recognition software. Please excuse any transcriptional errors.

## 2017-12-26 NOTE — Patient Instructions (Addendum)
Lab work today ( bmet )   Weigh Daily   No more than 500 mg Sodium per meal    Limit all fluids to less than 2 liters per day    Your physician recommends that you schedule a follow-up appointment with Dr.Taft Wednesday 02/14/18 at 1:40 pm

## 2017-12-27 LAB — BASIC METABOLIC PANEL
BUN / CREAT RATIO: 22 (ref 12–28)
BUN: 40 mg/dL — AB (ref 8–27)
CO2: 28 mmol/L (ref 20–29)
Calcium: 10 mg/dL (ref 8.7–10.3)
Chloride: 100 mmol/L (ref 96–106)
Creatinine, Ser: 1.81 mg/dL — ABNORMAL HIGH (ref 0.57–1.00)
GFR, EST AFRICAN AMERICAN: 29 mL/min/{1.73_m2} — AB (ref >59)
GFR, EST NON AFRICAN AMERICAN: 25 mL/min/{1.73_m2} — AB (ref >59)
GLUCOSE: 92 mg/dL (ref 65–99)
Potassium: 4.2 mmol/L (ref 3.5–5.2)
SODIUM: 143 mmol/L (ref 134–144)

## 2018-01-01 ENCOUNTER — Telehealth: Payer: Self-pay

## 2018-01-01 DIAGNOSIS — I5032 Chronic diastolic (congestive) heart failure: Secondary | ICD-10-CM

## 2018-01-01 NOTE — Telephone Encounter (Signed)
Patients daughter stated patient is still complaining of her breathing and has not had an appetite today.

## 2018-01-01 NOTE — Telephone Encounter (Signed)
-----   Message from Lonn Georgia, PA-C sent at 12/29/2017  1:01 PM EDT ----- Please let her know that her kidney function is a little bit worse than it was. Can she please hold the Lasix for 3 days and then start it back it every other day. In order to keep her kidneys functioning, we may have to live with a little bit of lower extremity edema. Please make sure her weight is stable and her breathing is okay. Thanks

## 2018-01-02 NOTE — Telephone Encounter (Signed)
Can anyone see her this week? She may need to be admitted. If no one can see her, please speak w/ daughter about taking her to the ER. Thanks

## 2018-01-04 ENCOUNTER — Telehealth: Payer: Self-pay

## 2018-01-04 MED ORDER — HYDRALAZINE HCL 50 MG PO TABS: 50.0000 mg | ORAL_TABLET | Freq: Two times a day (BID) | ORAL | 11 refills | Status: AC

## 2018-01-04 NOTE — Telephone Encounter (Signed)
Spoke with Patients daughter and she stated patients breathing is better she has some swelling in her ankles but she said its probably due to the patient holding Lasix for 3 days per your request. Breathing is better.

## 2018-01-04 NOTE — Telephone Encounter (Signed)
Rx(s) sent to pharmacy electronically.  

## 2018-01-04 NOTE — Telephone Encounter (Signed)
Since she does not need to be admitted, she needs a repeat BMET in 2 weeks. Thanks

## 2018-01-04 NOTE — Telephone Encounter (Signed)
Pt is requesting a refill on hydrALAZINE (APRESOLINE) 50 MG. Please address Thank You.

## 2018-01-11 NOTE — Telephone Encounter (Signed)
Spoke with daughter, per DPR, informed her that Lisa Sawyer wanted her mom to have labs next week. She voiced understanding. Order place in chart.

## 2018-01-16 DIAGNOSIS — I5032 Chronic diastolic (congestive) heart failure: Secondary | ICD-10-CM | POA: Diagnosis not present

## 2018-01-17 LAB — BASIC METABOLIC PANEL
BUN/Creatinine Ratio: 16 (ref 12–28)
BUN: 28 mg/dL — ABNORMAL HIGH (ref 8–27)
CALCIUM: 10.1 mg/dL (ref 8.7–10.3)
CHLORIDE: 100 mmol/L (ref 96–106)
CO2: 26 mmol/L (ref 20–29)
Creatinine, Ser: 1.75 mg/dL — ABNORMAL HIGH (ref 0.57–1.00)
GFR, EST AFRICAN AMERICAN: 30 mL/min/{1.73_m2} — AB (ref >59)
GFR, EST NON AFRICAN AMERICAN: 26 mL/min/{1.73_m2} — AB (ref >59)
Glucose: 107 mg/dL — ABNORMAL HIGH (ref 65–99)
POTASSIUM: 4.4 mmol/L (ref 3.5–5.2)
Sodium: 142 mmol/L (ref 134–144)

## 2018-02-05 ENCOUNTER — Other Ambulatory Visit: Payer: Self-pay

## 2018-02-05 MED ORDER — FUROSEMIDE 40 MG PO TABS: 20.0000 mg | ORAL_TABLET | ORAL | 3 refills | Status: AC

## 2018-02-14 ENCOUNTER — Ambulatory Visit (INDEPENDENT_AMBULATORY_CARE_PROVIDER_SITE_OTHER): Payer: Medicare Other | Admitting: Cardiovascular Disease

## 2018-02-14 ENCOUNTER — Encounter: Payer: Self-pay | Admitting: Cardiovascular Disease

## 2018-02-14 VITALS — BP 119/55 | HR 67 | Ht 65.0 in | Wt 137.8 lb

## 2018-02-14 DIAGNOSIS — R0602 Shortness of breath: Secondary | ICD-10-CM

## 2018-02-14 DIAGNOSIS — I10 Essential (primary) hypertension: Secondary | ICD-10-CM

## 2018-02-14 DIAGNOSIS — E0590 Thyrotoxicosis, unspecified without thyrotoxic crisis or storm: Secondary | ICD-10-CM | POA: Diagnosis not present

## 2018-02-14 DIAGNOSIS — R197 Diarrhea, unspecified: Secondary | ICD-10-CM | POA: Diagnosis not present

## 2018-02-14 DIAGNOSIS — R600 Localized edema: Secondary | ICD-10-CM | POA: Diagnosis not present

## 2018-02-14 DIAGNOSIS — I5032 Chronic diastolic (congestive) heart failure: Secondary | ICD-10-CM | POA: Diagnosis not present

## 2018-02-14 MED ORDER — ISOSORBIDE MONONITRATE ER 30 MG PO TB24: 30.0000 mg | ORAL_TABLET | Freq: Every day | ORAL | 5 refills | Status: DC

## 2018-02-14 NOTE — Progress Notes (Signed)
Cardiology Office Note   Date:  02/14/2018   ID:  Lisa Sawyer, DOB [birth year], MRN [id number]  PCP:  Patient, No Pcp Per  Cardiologist:   Skeet Latch, MD   No chief complaint on file.    History of Present Illness: Lisa Sawyer is a 84 y.o. female with hyperlipidemia, dementia, breast cancer status post lumpectomy, CKD IV, and anxiety who presents for follow up.  She was admitted 10/2017 with chest pain in the setting of hypertensive urgency.  Troponin was mildly elevated but she was managed medically due to age and dementia.  Echo that hospitalization revealed LVEF 60 to 65% with grade 1 diastolic dysfunction.  She followed up with [name], PA, on 11/2017.  At that appointment she reported exertional dyspnea.  She continues to have shortness of breath with minimal exertion.  She denies chest pain or pressure.  She has lower extremity edema but no orthopnea or PND.  She also complains of chronic back pain.  Her daughter notes that she has experienced diarrhea this week.  She has no fever, chills or abdominal pain.   Past Medical History:  Diagnosis Date  . Acute blood loss anemia    secondary to surgery, requiring blood transfusion  . Anemia   . Anxiety attack   . breast ca dx'd 2009   pt states pre ca; lumpectomy-lt w/ xrt  . History of breast cancer   . Hypercholesterolemia   . Hypertension   . Hypokalemia    treated  . Hypothyroidism   . Idiopathic thrombocytopenic purpura (HCC)    A questionable history of idiopathic thrombocytopenic purpura  . Leukocytosis   . Osteoarthritis    left knee  . Pyrexia   . Thrombocytopenia ([place])   . Thyroid disease     Past Surgical History:  Procedure Laterality Date  . BREAST LUMPECTOMY    . BREAST SURGERY    . CHOLECYSTECTOMY    . JOINT REPLACEMENT    . KNEE RECONSTRUCTION, MEDIAL PATELLAR FEMORAL LIGAMENT    . total knee       Current Outpatient Medications  Medication Sig Dispense Refill  . amLODipine  (NORVASC) 10 MG tablet Take 1 tablet (10 mg total) by mouth daily. 90 tablet 1  . aspirin EC 81 MG tablet Take 81 mg by mouth at bedtime.     Marland Kitchen atorvastatin (LIPITOR) 10 MG tablet Take 1 tablet (10 mg total) by mouth daily. 90 tablet 3  . calcium-vitamin D (OSCAL WITH D) 500-200 MG-UNIT per tablet Take 1 tablet by mouth daily.    . carvedilol (COREG) 6.25 MG tablet Take 1 tablet (6.25 mg total) by mouth 2 (two) times daily with a meal. 180 tablet 1  . furosemide (LASIX) 40 MG tablet Take 0.5 tablets (20 mg total) by mouth 3 (three) times a week. 30 tablet 3  . hydrALAZINE (APRESOLINE) 50 MG tablet Take 1 tablet (50 mg total) by mouth 2 (two) times daily. 60 tablet 11  . levothyroxine (SYNTHROID, LEVOTHROID) 100 MCG tablet Take 1 tablet (100 mcg total) by mouth daily before breakfast. 90 tablet 1  . mometasone (NASONEX) 50 MCG/ACT nasal spray instill 2 sprays into each nostril once daily (Patient taking differently: Place 2 sprays into the nose daily as needed (for allergies). ) 17 g 2  . nitroGLYCERIN (NITROSTAT) 0.4 MG SL tablet Place 1 tablet (0.4 mg total) under the tongue every 5 (five) minutes as needed for chest pain. 30 tablet 0  .  sertraline (ZOLOFT) 25 MG tablet Take 25 mg by mouth daily.    . isosorbide mononitrate (IMDUR) 30 MG 24 hr tablet Take 1 tablet (30 mg total) by mouth daily. 30 tablet 5   No current facility-administered medications for this visit.     Allergies:   Naprosyn [naproxen]    Social History:  The patient  reports that she has quit smoking. She has never used smokeless tobacco. She reports that she does not drink alcohol or use drugs.   Family History:  The patient's family history includes Cancer in her mother; Heart disease in her mother and sister.    ROS:  Please see the history of present illness.   Otherwise, review of systems are positive for none.   All other systems are reviewed and negative.    PHYSICAL EXAM: VS:  BP (!) 119/55   Pulse 67   Ht 5'  5" (1.651 m)   Wt 137 lb 12.8 oz (62.5 kg)   BMI 22.93 kg/m  , BMI Body mass index is 22.93 kg/m. GENERAL:  Well appearing.  No acute distress.   HEENT:  Pupils equal round and reactive, fundi not visualized, oral mucosa unremarkable NECK:  No jugular venous distention, waveform within normal limits, carotid upstroke brisk and symmetric, no bruits LUNGS:  Clear to auscultation bilaterally HEART:  RRR.  PMI not displaced or sustained,S1 and S2 within normal limits, no S3, no S4, no clicks, no rubs, no murmurs ABD:  Flat, positive bowel sounds normal in frequency in pitch, no bruits, no rebound, no guarding, no midline pulsatile mass, no hepatomegaly, no splenomegaly EXT:  2 plus pulses throughout, 2+ pitting edema at the ankles bilaterally, no cyanosis no clubbing SKIN:  No rashes no nodules NEURO:  Cranial nerves II through XII grossly intact, motor grossly intact throughout PSYCH: Oriented to person only.    EKG:  EKG is not ordered today.   Recent Labs: 10/31/2017: B Natriuretic Peptide 380.1 11/02/2017: Magnesium 1.5 11/28/2017: ALT 13; Hemoglobin 11.3; Platelets 337; TSH 0.047 01/16/2018: BUN 28; Creatinine, Ser 1.75; Potassium 4.4; Sodium 142    Lipid Panel    Component Value Date/Time   CHOL 152 11/02/2017 0527   TRIG 35 11/02/2017 0527   HDL 76 11/02/2017 0527   CHOLHDL 2.0 11/02/2017 0527   VLDL 7 11/02/2017 0527   LDLCALC 69 11/02/2017 0527      Wt Readings from Last 3 Encounters:  02/14/18 137 lb 12.8 oz (62.5 kg)  12/26/17 136 lb (61.7 kg)  11/28/17 133 lb 6.4 oz (60.5 kg)      ASSESSMENT AND PLAN:  # Shortness of breath:  # Chronic diastolic heart failure: Grade one diastolic dysfunction on echo.  Her neck veins are not elevated and her edema seems to be more attributable to venous insufficiency than heart failure.  We will check a BNP.  Continue Lasix and she will start back wearing her compression stockings when awake during the daytime.  Troponin was mildly  elevated in the hospital.  She is not a candidate for an ischemia evaluation given her age and advanced dementia.  We will start Imdur 30mg  daily to see if this helps.    # Essential hypertension: BP well-controlled on amlodipine, carvedilol, hydralazine and lasix.   # Diarrhea: Check thyroid studies.  If this persists she will follow up with her PCP.   Current medicines are reviewed at length with the patient today.  The patient does not have concerns regarding medicines.  The  following changes have been made:  Start Imdur   Labs/ tests ordered today include:   Orders Placed This Encounter  Procedures  . T4, free  . TSH  . Pro b natriuretic peptide (BNP)  . T3     Disposition:   FU with Ngan Qualls C. Oval Linsey, MD, [facility] in 2-3 months.      Signed, Kaileb Monsanto C. Oval Linsey, MD, [facility]  02/14/2018 2:05 PM    [facility]

## 2018-02-14 NOTE — Patient Instructions (Addendum)
Medication Instructions:  START ISOSORBIDE 30 MG DAILY   Labwork: TSH/FT4/T3/BNP TODAY   Testing/Procedures: NONE  Follow-Up: Your physician recommends that you schedule a follow-up appointment in: 2-3 MONTHS   Any Other Special Instructions Will Be Listed Below (If Applicable).  WEAR COMPRESSION STOCKINGS DURING THE DAY, TAKE THEM OFF AT NIGHT   If you need a refill on your cardiac medications before your next appointment, please call your pharmacy.

## 2018-02-15 LAB — PRO B NATRIURETIC PEPTIDE: NT-PRO BNP: 474 pg/mL (ref 0–738)

## 2018-02-15 LAB — T4, FREE: Free T4: 1.45 ng/dL (ref 0.82–1.77)

## 2018-02-15 LAB — T3: T3, Total: 67 ng/dL — ABNORMAL LOW (ref 71–180)

## 2018-02-15 LAB — TSH: TSH: 1.33 u[IU]/mL (ref 0.450–4.500)

## 2018-04-25 ENCOUNTER — Ambulatory Visit: Payer: Medicare Other | Admitting: Cardiovascular Disease

## 2018-05-15 ENCOUNTER — Ambulatory Visit (INDEPENDENT_AMBULATORY_CARE_PROVIDER_SITE_OTHER): Payer: Medicare Other | Admitting: Cardiovascular Disease

## 2018-05-15 ENCOUNTER — Encounter: Payer: Self-pay | Admitting: Cardiovascular Disease

## 2018-05-15 VITALS — BP 114/60 | HR 55 | Ht 62.0 in | Wt 134.0 lb

## 2018-05-15 DIAGNOSIS — I10 Essential (primary) hypertension: Secondary | ICD-10-CM

## 2018-05-15 DIAGNOSIS — I5032 Chronic diastolic (congestive) heart failure: Secondary | ICD-10-CM

## 2018-05-15 DIAGNOSIS — R0602 Shortness of breath: Secondary | ICD-10-CM | POA: Diagnosis not present

## 2018-05-15 MED ORDER — ISOSORBIDE MONONITRATE ER 30 MG PO TB24: 30.0000 mg | ORAL_TABLET | Freq: Every day | ORAL | 1 refills | Status: AC

## 2018-05-15 MED ORDER — AMLODIPINE BESYLATE 5 MG PO TABS: 5.0000 mg | ORAL_TABLET | Freq: Every day | ORAL | 3 refills | Status: AC

## 2018-05-15 NOTE — Addendum Note (Signed)
Addended by: Jacqulynn Cadet on: 05/15/2018 04:03 PM   Modules accepted: Orders

## 2018-05-15 NOTE — Patient Instructions (Signed)
Medication Instructions:  DECREASE YOUR AMLODIPINE TO 5 MG DAILY. TAKE 1/2 OF THE 10 MG TABLETS UNTIL CURRENT BOTTLE FINISHED  START ISOSORBIDE 30 MG DAILY   Labwork: NONE  Testing/Procedures: NONE  Follow-Up: Your physician recommends that you schedule a follow-up appointment in: 2 MONTHS   If you need a refill on your cardiac medications before your next appointment, please call your pharmacy.

## 2018-05-15 NOTE — Progress Notes (Signed)
Cardiology Office Note   Date:  05/15/2018   ID:  Lisa Sawyer, DOB 17-Nov-1931, MRN [id number]  PCP:  Patient, No Pcp Per  Cardiologist:   Lisa Latch, MD   Chief Complaint  Patient presents with  . Edema    both ankles     History of Present Illness: Lisa Sawyer is a 84 y.o. female with hyperlipidemia, dementia, breast cancer status post lumpectomy, CKD IV, and anxiety who presents for follow up.  She was admitted 10/2017 with chest pain in the setting of hypertensive urgency.  Troponin was mildly elevated but she was managed medically due to age and dementia.  Echo that hospitalization revealed LVEF 60 to 65% with grade 1 diastolic dysfunction.  She followed up with Lisa Ferries, PA, on 11/2017.  At that appointment she reported exertional dyspnea.  At her last appointment she continued to have shortness of breath with minimal exertion.  She denied chest pain or pressure.  She has lower extremity edema but no orthopnea or PND.  Her symptoms were thought to be due to venous stasis.  BNP was normal.  It was recommended that she wear compression socks.  However she struggles to get these on and forgets where they need to be on and sometimes takes them off.  She was also prescribed isosorbide mononitrate.  However her caregiver was confused and thought this was regular nitroglycerin and he did not pick it up.  She continues to have edema.  She gets short of breath with exertion but no chest pain.  She has no orthopnea or PND.   Past Medical History:  Diagnosis Date  . Acute blood loss anemia    secondary to surgery, requiring blood transfusion  . Anemia   . Anxiety attack   . breast ca dx'd 2009   pt states pre ca; lumpectomy-lt w/ xrt  . History of breast cancer   . Hypercholesterolemia   . Hypertension   . Hypokalemia    treated  . Hypothyroidism   . Idiopathic thrombocytopenic purpura (HCC)    A questionable history of idiopathic thrombocytopenic purpura  .  Leukocytosis   . Osteoarthritis    left knee  . Pyrexia   . Thrombocytopenia ([place])   . Thyroid disease     Past Surgical History:  Procedure Laterality Date  . BREAST LUMPECTOMY    . BREAST SURGERY    . CHOLECYSTECTOMY    . JOINT REPLACEMENT    . KNEE RECONSTRUCTION, MEDIAL PATELLAR FEMORAL LIGAMENT    . total knee       Current Outpatient Medications  Medication Sig Dispense Refill  . amLODipine (NORVASC) 5 MG tablet Take 1 tablet (5 mg total) by mouth daily. 90 tablet 3  . aspirin EC 81 MG tablet Take 81 mg by mouth at bedtime.     Marland Kitchen atorvastatin (LIPITOR) 10 MG tablet Take 1 tablet (10 mg total) by mouth daily. 90 tablet 3  . calcium-vitamin D (OSCAL WITH D) 500-200 MG-UNIT per tablet Take 1 tablet by mouth daily.    . carvedilol (COREG) 6.25 MG tablet Take 1 tablet (6.25 mg total) by mouth 2 (two) times daily with a meal. 180 tablet 1  . furosemide (LASIX) 40 MG tablet Take 0.5 tablets (20 mg total) by mouth 3 (three) times a week. 30 tablet 3  . hydrALAZINE (APRESOLINE) 50 MG tablet Take 1 tablet (50 mg total) by mouth 2 (two) times daily. 60 tablet 11  . levothyroxine (SYNTHROID, LEVOTHROID)  100 MCG tablet Take 1 tablet (100 mcg total) by mouth daily before breakfast. 90 tablet 1  . mometasone (NASONEX) 50 MCG/ACT nasal spray instill 2 sprays into each nostril once daily (Patient taking differently: Place 2 sprays into the nose daily as needed (for allergies). ) 17 g 2  . nitroGLYCERIN (NITROSTAT) 0.4 MG SL tablet Place 1 tablet (0.4 mg total) under the tongue every 5 (five) minutes as needed for chest pain. 30 tablet 0  . sertraline (ZOLOFT) 25 MG tablet Take 25 mg by mouth daily.    . isosorbide mononitrate (IMDUR) 30 MG 24 hr tablet Take 1 tablet (30 mg total) by mouth daily. 90 tablet 1   No current facility-administered medications for this visit.     Allergies:   Naprosyn [naproxen]    Social History:  The patient  reports that she has quit smoking. She has never  used smokeless tobacco. She reports that she does not drink alcohol or use drugs.   Family History:  The patient's family history includes Cancer in her mother; Heart disease in her mother and sister.    ROS:  Please see the history of present illness.   Otherwise, review of systems are positive for none.   All other systems are reviewed and negative.    PHYSICAL EXAM: VS:  BP 114/60   Pulse (!) 55   Ht 5\' 2"  (1.575 m)   Wt 134 lb (60.8 kg)   BMI 24.51 kg/m  , BMI Body mass index is 24.51 kg/m. GENERAL:  Well appearing.  No acute distress.   HEENT:  Pupils equal round and reactive, fundi not visualized, oral mucosa unremarkable NECK:  No jugular venous distention, waveform within normal limits, carotid upstroke brisk and symmetric, no bruits LUNGS:  Clear to auscultation bilaterally HEART:  RRR.  PMI not displaced or sustained,S1 and S2 within normal limits, no S3, no S4, no clicks, no rubs, no murmurs ABD:  Flat, positive bowel sounds normal in frequency in pitch, no bruits, no rebound, no guarding, no midline pulsatile mass, no hepatomegaly, no splenomegaly EXT:  2 plus pulses throughout, 2+ pitting edema at the ankles bilaterally, no cyanosis no clubbing SKIN:  No rashes no nodules NEURO:  Cranial nerves II through XII grossly intact, motor grossly intact throughout PSYCH: Oriented to person only.    EKG:  EKG is ordered today. 05/15/2018: Sinus bradycardia.  Rate 55 bpm.  First degree AV block.   Recent Labs: 10/31/2017: B Natriuretic Peptide 380.1 11/02/2017: Magnesium 1.5 11/28/2017: ALT 13; Hemoglobin 11.3; Platelets 337 01/16/2018: BUN 28; Creatinine, Ser 1.75; Potassium 4.4; Sodium 142 02/14/2018: NT-Pro BNP 474; TSH 1.330    Lipid Panel    Component Value Date/Time   CHOL 152 11/02/2017 0527   TRIG 35 11/02/2017 0527   HDL 76 11/02/2017 0527   CHOLHDL 2.0 11/02/2017 0527   VLDL 7 11/02/2017 0527   LDLCALC 69 11/02/2017 0527      Wt Readings from Last 3 Encounters:    05/15/18 134 lb (60.8 kg)  02/14/18 137 lb 12.8 oz (62.5 kg)  12/26/17 136 lb (61.7 kg)      ASSESSMENT AND PLAN:  # Shortness of breath:  # Chronic diastolic heart failure: Grade one diastolic dysfunction on echo.  Her neck veins are not elevated and her edema seems to be more attributable to venous insufficiency than heart failure.  BNP was normal.  She has not been able to tolerate compression socks.  I wonder if it may  also be due to her amlodipine.  We will try reducing it to 5 mg to see if this helps.  Limit salt intake and continue furosemide 3 times per week. Troponin was mildly elevated in the hospital.  She is not a candidate for an ischemia evaluation given her age and advanced dementia.     # Essential hypertension: BP well-controlled on amlodipine, carvedilol, hydralazine and lasix.  However, we will reduce amlodipine to see if this helps with her edema.  Adding isosorbide mononitrate as above.     Current medicines are reviewed at length with the patient today.  The patient does not have concerns regarding medicines.  The following changes have been made:  Start Imdur.  Reduce amlodipine.  Labs/ tests ordered today include:   No orders of the defined types were placed in this encounter.    Disposition:   FU with Anaalicia Reimann C. Oval Linsey, MD, [facility] 2 months.     Signed, Hazen Brumett C. Oval Linsey, MD, [facility]  05/15/2018 11:29 AM    [place]

## 2018-06-22 ENCOUNTER — Other Ambulatory Visit: Payer: Self-pay | Admitting: Physician Assistant

## 2018-06-30 ENCOUNTER — Encounter (HOSPITAL_COMMUNITY): Payer: Self-pay

## 2018-06-30 ENCOUNTER — Emergency Department (HOSPITAL_COMMUNITY)
Admission: EM | Admit: 2018-06-30 | Discharge: 2018-07-01 | Disposition: A | Discharge status: Home / Self Care | Payer: Medicare Other | Attending: Emergency Medicine | Admitting: Emergency Medicine

## 2018-06-30 ENCOUNTER — Other Ambulatory Visit: Payer: Self-pay

## 2018-06-30 ENCOUNTER — Emergency Department (HOSPITAL_COMMUNITY): Payer: Medicare Other

## 2018-06-30 DIAGNOSIS — R42 Dizziness and giddiness: Secondary | ICD-10-CM | POA: Diagnosis not present

## 2018-06-30 DIAGNOSIS — Z853 Personal history of malignant neoplasm of breast: Secondary | ICD-10-CM | POA: Diagnosis not present

## 2018-06-30 DIAGNOSIS — Z79899 Other long term (current) drug therapy: Secondary | ICD-10-CM | POA: Diagnosis not present

## 2018-06-30 DIAGNOSIS — I10 Essential (primary) hypertension: Secondary | ICD-10-CM

## 2018-06-30 DIAGNOSIS — D649 Anemia, unspecified: Secondary | ICD-10-CM | POA: Insufficient documentation

## 2018-06-30 DIAGNOSIS — R4020 Unspecified coma: Secondary | ICD-10-CM | POA: Diagnosis not present

## 2018-06-30 MED ORDER — HYDRALAZINE HCL 20 MG/ML IJ SOLN
10.0000 mg | Freq: Once | INTRAMUSCULAR | Status: AC
  Administered 2018-07-01: 10 mg via INTRAVENOUS
  Filled 2018-06-30: qty 1

## 2018-06-30 NOTE — ED Triage Notes (Addendum)
Pt reports feeling like her BP is elevated all day today. Endorses a headache and feeling "swimmy-headed." Highest reading at home was 924 systolic. No facial droop or unilateral weakness noted. Hx dementia per family. They report that she seems to be at baseline. They also report that she hasn't had her carvedilol  In a couple of days.

## 2018-06-30 NOTE — ED Provider Notes (Signed)
[place] DEPT Provider Note   CSN: [id number] Arrival date & time: 06/30/18  2244     History   Chief Complaint Chief Complaint  Patient presents with  . Hypertension    HPI Lisa Sawyer is a 84 y.o. female.  Patient presents to the ER for evaluation of elevated blood pressure.  Patient's blood pressure has been elevated all day today.  She has been feeling "swimmy headed" and dizzy through the course of today.  No headache or vision change.  No chest pain, shortness of breath, heart palpitations.  Patient has been taking her Norvasc and hydralazine, but has been out of her carvedilol for "sometime".  She is waiting for a refill from her doctor.  She also recently had her Norvasc dose cut from 10mg  to 5mg  and Lasix dosing decreased because of renal insufficiency.     Past Medical History:  Diagnosis Date  . Acute blood loss anemia    secondary to surgery, requiring blood transfusion  . Anemia   . Anxiety attack   . breast ca dx'd 2009   pt states pre ca; lumpectomy-lt w/ xrt  . History of breast cancer   . Hypercholesterolemia   . Hypertension   . Hypokalemia    treated  . Hypothyroidism   . Idiopathic thrombocytopenic purpura (HCC)    A questionable history of idiopathic thrombocytopenic purpura  . Leukocytosis   . Osteoarthritis    left knee  . Pyrexia   . Thrombocytopenia ([place])   . Thyroid disease     Patient Active Problem List   Diagnosis Date Noted  . Central chest pain 10/31/2017  . Hypertensive urgency 10/31/2017  . HTN (hypertension), malignant 05/16/2013  . Headache 05/16/2013  . Hyponatremia 05/16/2013  . Edema 02/21/2012  . Depression with anxiety 12/08/2011  . Shortness of breath 09/30/2011  . Palpitations 09/06/2011  . Anemia   . Thrombocytopenia ([place])   . Malignant neoplasm of female breast ([place]) 02/07/2007  . Hypothyroidism 02/07/2007  . HTN (hypertension), benign 02/07/2007  . [place]  DISEASE 02/07/2007    Past Surgical History:  Procedure Laterality Date  . BREAST LUMPECTOMY    . BREAST SURGERY    . CHOLECYSTECTOMY    . JOINT REPLACEMENT    . KNEE RECONSTRUCTION, MEDIAL PATELLAR FEMORAL LIGAMENT    . total knee       OB History   None      Home Medications    Prior to Admission medications   Medication Sig Start Date End Date Taking? Authorizing Provider  amLODipine (NORVASC) 5 MG tablet Take 1 tablet (5 mg total) by mouth daily. 05/15/18  Yes Skeet Latch, MD  Ascorbic Acid (VITAMIN C) 1000 MG tablet Take 1,000 mg by mouth daily.   Yes [provider]  aspirin EC 81 MG tablet Take 81 mg by mouth at bedtime.    Yes [provider]  atorvastatin (LIPITOR) 10 MG tablet Take 1 tablet (10 mg total) by mouth daily. 11/14/17  Yes Barrett, Evelene Croon, PA-C  carvedilol (COREG) 6.25 MG tablet Take 1 tablet (6.25 mg total) by mouth 2 (two) times daily with a meal. 11/28/17  Yes Skeet Latch, MD  furosemide (LASIX) 40 MG tablet Take 0.5 tablets (20 mg total) by mouth 3 (three) times a week. 02/05/18  Yes Barrett, Evelene Croon, PA-C  hydrALAZINE (APRESOLINE) 50 MG tablet Take 1 tablet (50 mg total) by mouth 2 (two) times daily. 01/04/18  Yes Skeet Latch, MD  levothyroxine (SYNTHROID, LEVOTHROID) 112 MCG tablet Take 112 mcg by mouth daily before breakfast.   Yes [provider]  mometasone (NASONEX) 50 MCG/ACT nasal spray instill 2 sprays into each nostril once daily Patient taking differently: Place 2 sprays into the nose daily as needed (for allergies).  07/24/13  Yes Barton Fanny, MD  nitroGLYCERIN (NITROSTAT) 0.4 MG SL tablet Place 1 tablet (0.4 mg total) under the tongue every 5 (five) minutes as needed for chest pain. 11/02/17  Yes Florencia Reasons, MD  isosorbide mononitrate (IMDUR) 30 MG 24 hr tablet Take 1 tablet (30 mg total) by mouth daily. Patient not taking: Reported on 07/01/2018 05/15/18   Skeet Latch, MD  levothyroxine  (SYNTHROID, LEVOTHROID) 100 MCG tablet TAKE 1 TABLET BY MOUTH ONCE DAILY BEFORE BREAKFAST Patient not taking: No sig reported 06/22/18   Leonie Douglas, PA-C    Family History Family History  Problem Relation Age of Onset  . Cancer Mother   . Heart disease Mother   . Heart disease Sister     Social History Social History   Tobacco Use  . Smoking status: Former Research scientist (life sciences)  . Smokeless tobacco: Never Used  Substance Use Topics  . Alcohol use: No  . Drug use: No     Allergies   Naprosyn [naproxen]   Review of Systems Review of Systems  Neurological: Positive for dizziness.  All other systems reviewed and are negative.    Physical Exam Updated Vital Signs BP (!) 143/56   Pulse 63   Temp 98.4 F (36.9 C) (Oral)   Resp 17   Ht 5\' 2"  (1.575 m)   Wt 61.2 kg   SpO2 100%   BMI 24.69 kg/m   Physical Exam  Constitutional: She is oriented to person, place, and time. She appears well-developed and well-nourished. No distress.  HENT:  Head: Normocephalic and atraumatic.  Right Ear: Hearing normal.  Left Ear: Hearing normal.  Nose: Nose normal.  Mouth/Throat: Oropharynx is clear and moist and mucous membranes are normal.  Eyes: Pupils are equal, round, and reactive to light. Conjunctivae and EOM are normal.  Neck: Normal range of motion. Neck supple.  Cardiovascular: Regular rhythm, S1 normal and S2 normal. Exam reveals no gallop and no friction rub.  No murmur heard. Pulmonary/Chest: Effort normal and breath sounds normal. No respiratory distress. She exhibits no tenderness.  Abdominal: Soft. Normal appearance and bowel sounds are normal. There is no hepatosplenomegaly. There is no tenderness. There is no rebound, no guarding, no tenderness at McBurney's point and negative Murphy's sign. No hernia.  Musculoskeletal: Normal range of motion.  Neurological: She is alert and oriented to person, place, and time. She has normal strength. No cranial nerve deficit or sensory  deficit. Coordination normal. GCS eye subscore is 4. GCS verbal subscore is 5. GCS motor subscore is 6.  Skin: Skin is warm, dry and intact. No rash noted. No cyanosis.  Psychiatric: She has a normal mood and affect. Her speech is normal and behavior is normal. Thought content normal.  Nursing note and vitals reviewed.    ED Treatments / Results  Labs (all labs ordered are listed, but only abnormal results are displayed) Labs Reviewed  CBC - Abnormal; Notable for the following components:      Result Value   RBC 3.80 (*)    Hemoglobin 11.6 (*)    All other components within normal limits  BASIC METABOLIC PANEL - Abnormal; Notable for the following components:   Glucose, Bld 109 (*)  BUN 31 (*)    Creatinine, Ser 1.34 (*)    GFR calc non Af Amer 35 (*)    GFR calc Af Amer 41 (*)    All other components within normal limits    EKG None  Radiology No results found.  Procedures Procedures (including critical care time)  Medications Ordered in ED Medications  hydrALAZINE (APRESOLINE) injection 10 mg (10 mg Intravenous Given 07/01/18 0013)  amLODipine (NORVASC) tablet 5 mg (5 mg Oral Given 07/01/18 0117)     Initial Impression / Assessment and Plan / ED Course  I have reviewed the triage vital signs and the nursing notes.  Pertinent labs & imaging results that were available during my care of the patient were reviewed by me and considered in my medical decision making (see chart for details).     Patient presents to the emergency department for evaluation of elevated blood pressure.  Patient does have some mild dizziness associated with the symptoms.  She recently had her Norvasc cut in half because of lower extremity edema.  She also had her Lasix dose adjusted for renal insufficiency.  Creatinine is 1.34 today.  Blood work otherwise unremarkable.  Patient treated with hydralazine and Norvasc, both of which she takes chronically.  Blood pressure is improved.  Head CT  performed.  Will recommend that she increase the Norvasc dose to 10 mg daily if blood pressures are running high, also can take additional labetalol as needed and have follow-up with her primary care doctor within 1 week for recheck of blood pressure and further instructions.  Final Clinical Impressions(s) / ED Diagnoses   Final diagnoses:  Essential hypertension    ED Discharge Orders    None       Octavis Sheeler, Gwenyth Allegra, MD 07/01/18 0127

## 2018-07-01 ENCOUNTER — Encounter (HOSPITAL_COMMUNITY): Payer: Self-pay | Admitting: Radiology

## 2018-07-01 ENCOUNTER — Other Ambulatory Visit: Payer: Self-pay | Admitting: Cardiovascular Disease

## 2018-07-01 DIAGNOSIS — R4020 Unspecified coma: Secondary | ICD-10-CM | POA: Diagnosis not present

## 2018-07-01 LAB — CBC
HEMATOCRIT: 37.3 % (ref 36.0–46.0)
HEMOGLOBIN: 11.6 g/dL — AB (ref 12.0–15.0)
MCH: 30.5 pg (ref 26.0–34.0)
MCHC: 31.1 g/dL (ref 30.0–36.0)
MCV: 98.2 fL (ref 80.0–100.0)
NRBC: 0 % (ref 0.0–0.2)
Platelets: 267 10*3/uL (ref 150–400)
RBC: 3.8 MIL/uL — AB (ref 3.87–5.11)
RDW: 13.6 % (ref 11.5–15.5)
WBC: 5.9 10*3/uL (ref 4.0–10.5)

## 2018-07-01 LAB — BASIC METABOLIC PANEL
ANION GAP: 7 (ref 5–15)
BUN: 31 mg/dL — ABNORMAL HIGH (ref 8–23)
CALCIUM: 9.5 mg/dL (ref 8.9–10.3)
CHLORIDE: 104 mmol/L (ref 98–111)
CO2: 29 mmol/L (ref 22–32)
Creatinine, Ser: 1.34 mg/dL — ABNORMAL HIGH (ref 0.44–1.00)
GFR calc non Af Amer: 35 mL/min — ABNORMAL LOW (ref >60)
GFR, EST AFRICAN AMERICAN: 41 mL/min — AB (ref >60)
Glucose, Bld: 109 mg/dL — ABNORMAL HIGH (ref 70–99)
Potassium: 3.9 mmol/L (ref 3.5–5.1)
Sodium: 140 mmol/L (ref 135–145)

## 2018-07-01 MED ORDER — AMLODIPINE BESYLATE 5 MG PO TABS
5.0000 mg | ORAL_TABLET | Freq: Once | ORAL | Status: AC
  Administered 2018-07-01: 5 mg via ORAL
  Filled 2018-07-01: qty 1

## 2018-07-01 NOTE — ED Notes (Signed)
Patient transported to CT 

## 2018-07-01 NOTE — ED Notes (Signed)
Patient unable to sign for discharge due to downtime

## 2018-07-01 NOTE — Discharge Instructions (Signed)
If your blood pressure continues to run high, go back to taking the Norvasc 10 mg a day.  Additionally, you can take an extra hydralazine as needed for elevated blood pressure.  Recheck your blood pressure at your doctor's office within the next week for further instructions.

## 2018-07-12 ENCOUNTER — Ambulatory Visit: Payer: Medicare Other | Admitting: Cardiovascular Disease

## 2018-07-12 NOTE — Progress Notes (Deleted)
Cardiology Office Note   Date:  07/12/2018   ID:  Lisa Sawyer, DOB Sep 27, 1931, MRN [id number]  PCP:  Patient, No Pcp Per  Cardiologist:   Skeet Latch, MD   No chief complaint on file.    History of Present Illness: Lisa Sawyer is a 84 y.o. female with hyperlipidemia, dementia, breast cancer status post lumpectomy, CKD IV, and anxiety who presents for follow up.  She was admitted 10/2017 with chest pain in the setting of hypertensive urgency.  Troponin was mildly elevated but she was managed medically due to age and dementia.  Echo that hospitalization revealed LVEF 60 to 65% with grade 1 diastolic dysfunction.  She followed up with [name], PA, on 11/2017.  At that appointment she reported exertional dyspnea.  At her last appointment she continued to have shortness of breath with minimal exertion.  She denied chest pain or pressure.  She has lower extremity edema but no orthopnea or PND.  Her symptoms were thought to be due to venous stasis.  BNP was normal.  It was recommended that she wear compression socks.  However she struggles to get these on and forgets where they need to be on and sometimes takes them off.  She was also prescribed isosorbide mononitrate.  However her caregiver was confused and thought this was regular nitroglycerin and he did not pick it up.  She continues to have edema.  She gets short of breath with exertion but no chest pain.  She has no orthopnea or PND.  At her last appointment Lisa Sawyer blood pressure was well have been controlled.  However she had lower extremity edema that was thought to be due to amlodipine. SHe was started on Imdur.  Since that time she was seen in the ED 06/2018 with hypertension and feeling poorly.  At the time she was out of her carvedilol.  Her blood pressure in the ED was 143/56, .  Past Medical History:  Diagnosis Date  . Acute blood loss anemia    secondary to surgery, requiring blood transfusion  . Anemia   .  Anxiety attack   . breast ca dx'd 2009   pt states pre ca; lumpectomy-lt w/ xrt  . History of breast cancer   . Hypercholesterolemia   . Hypertension   . Hypokalemia    treated  . Hypothyroidism   . Idiopathic thrombocytopenic purpura (HCC)    A questionable history of idiopathic thrombocytopenic purpura  . Leukocytosis   . Osteoarthritis    left knee  . Pyrexia   . Thrombocytopenia ([place])   . Thyroid disease     Past Surgical History:  Procedure Laterality Date  . BREAST LUMPECTOMY    . BREAST SURGERY    . CHOLECYSTECTOMY    . JOINT REPLACEMENT    . KNEE RECONSTRUCTION, MEDIAL PATELLAR FEMORAL LIGAMENT    . total knee       Current Outpatient Medications  Medication Sig Dispense Refill  . amLODipine (NORVASC) 5 MG tablet Take 1 tablet (5 mg total) by mouth daily. 90 tablet 3  . Ascorbic Acid (VITAMIN C) 1000 MG tablet Take 1,000 mg by mouth daily.    Marland Kitchen aspirin EC 81 MG tablet Take 81 mg by mouth at bedtime.     Marland Kitchen atorvastatin (LIPITOR) 10 MG tablet Take 1 tablet (10 mg total) by mouth daily. 90 tablet 3  . carvedilol (COREG) 6.25 MG tablet TAKE 1 TABLET BY MOUTH TWICE DAILY WITH MEALS 180 tablet  3  . furosemide (LASIX) 40 MG tablet Take 0.5 tablets (20 mg total) by mouth 3 (three) times a week. 30 tablet 3  . hydrALAZINE (APRESOLINE) 50 MG tablet Take 1 tablet (50 mg total) by mouth 2 (two) times daily. 60 tablet 11  . isosorbide mononitrate (IMDUR) 30 MG 24 hr tablet Take 1 tablet (30 mg total) by mouth daily. (Patient not taking: Reported on 07/01/2018) 90 tablet 1  . levothyroxine (SYNTHROID, LEVOTHROID) 100 MCG tablet TAKE 1 TABLET BY MOUTH ONCE DAILY BEFORE BREAKFAST (Patient not taking: No sig reported) 90 tablet 1  . levothyroxine (SYNTHROID, LEVOTHROID) 112 MCG tablet Take 112 mcg by mouth daily before breakfast.    . mometasone (NASONEX) 50 MCG/ACT nasal spray instill 2 sprays into each nostril once daily (Patient taking differently: Place 2 sprays into the nose  daily as needed (for allergies). ) 17 g 2  . nitroGLYCERIN (NITROSTAT) 0.4 MG SL tablet Place 1 tablet (0.4 mg total) under the tongue every 5 (five) minutes as needed for chest pain. 30 tablet 0   No current facility-administered medications for this visit.     Allergies:   Naprosyn [naproxen]    Social History:  The patient  reports that she has quit smoking. She has never used smokeless tobacco. She reports that she does not drink alcohol or use drugs.   Family History:  The patient's family history includes Cancer in her mother; Heart disease in her mother and sister.    ROS:  Please see the history of present illness.   Otherwise, review of systems are positive for none.   All other systems are reviewed and negative.    PHYSICAL EXAM: VS:  There were no vitals taken for this visit. , BMI There is no height or weight on file to calculate BMI. GENERAL:  Well appearing.  No acute distress.   HEENT:  Pupils equal round and reactive, fundi not visualized, oral mucosa unremarkable NECK:  No jugular venous distention, waveform within normal limits, carotid upstroke brisk and symmetric, no bruits LUNGS:  Clear to auscultation bilaterally HEART:  RRR.  PMI not displaced or sustained,S1 and S2 within normal limits, no S3, no S4, no clicks, no rubs, no murmurs ABD:  Flat, positive bowel sounds normal in frequency in pitch, no bruits, no rebound, no guarding, no midline pulsatile mass, no hepatomegaly, no splenomegaly EXT:  2 plus pulses throughout, 2+ pitting edema at the ankles bilaterally, no cyanosis no clubbing SKIN:  No rashes no nodules NEURO:  Cranial nerves II through XII grossly intact, motor grossly intact throughout PSYCH: Oriented to person only.    EKG:  EKG is ordered today. 05/15/2018: Sinus bradycardia.  Rate 55 bpm.  First degree AV block.   Recent Labs: 10/31/2017: B Natriuretic Peptide 380.1 11/02/2017: Magnesium 1.5 11/28/2017: ALT 13 02/14/2018: NT-Pro BNP 474; TSH  1.330 06/30/2018: BUN 31; Creatinine, Ser 1.34; Hemoglobin 11.6; Platelets 267; Potassium 3.9; Sodium 140    Lipid Panel    Component Value Date/Time   CHOL 152 11/02/2017 0527   TRIG 35 11/02/2017 0527   HDL 76 11/02/2017 0527   CHOLHDL 2.0 11/02/2017 0527   VLDL 7 11/02/2017 0527   LDLCALC 69 11/02/2017 0527      Wt Readings from Last 3 Encounters:  06/30/18 135 lb (61.2 kg)  05/15/18 134 lb (60.8 kg)  02/14/18 137 lb 12.8 oz (62.5 kg)      ASSESSMENT AND PLAN:  # Shortness of breath:  # Chronic diastolic  heart failure: Grade one diastolic dysfunction on echo.  Her neck veins are not elevated and her edema seems to be more attributable to venous insufficiency than heart failure.  BNP was normal.  She has not been able to tolerate compression socks.  I wonder if it may also be due to her amlodipine.  We will try reducing it to 5 mg to see if this helps.  Limit salt intake and continue furosemide 3 times per week. Troponin was mildly elevated in the hospital.  She is not a candidate for an ischemia evaluation given her age and advanced dementia.     # Essential hypertension: BP well-controlled on amlodipine, carvedilol, hydralazine and lasix.  However, we will reduce amlodipine to see if this helps with her edema.  Adding isosorbide mononitrate as above.   Current medicines are reviewed at length with the patient today.  The patient does not have concerns regarding medicines.  The following changes have been made:  Start Imdur.  Reduce amlodipine.  Labs/ tests ordered today include:   No orders of the defined types were placed in this encounter.    Disposition:   FU with Zona Pedro C. Oval Linsey, MD, [facility] 2 months.     Signed, Aubra Pappalardo C. Oval Linsey, MD, [facility]  07/12/2018 8:56 AM    [place]

## 2018-07-13 ENCOUNTER — Encounter: Payer: Self-pay | Admitting: Cardiovascular Disease

## 2018-07-23 ENCOUNTER — Encounter (HOSPITAL_COMMUNITY): Payer: Self-pay

## 2018-07-23 ENCOUNTER — Emergency Department (HOSPITAL_COMMUNITY)
Admission: EM | Admit: 2018-07-23 | Discharge: 2018-07-23 | Disposition: A | Discharge status: Home / Self Care | Payer: Medicare Other | Attending: Emergency Medicine | Admitting: Emergency Medicine

## 2018-07-23 DIAGNOSIS — Z87891 Personal history of nicotine dependence: Secondary | ICD-10-CM | POA: Diagnosis not present

## 2018-07-23 DIAGNOSIS — Z79899 Other long term (current) drug therapy: Secondary | ICD-10-CM | POA: Diagnosis not present

## 2018-07-23 DIAGNOSIS — K5641 Fecal impaction: Secondary | ICD-10-CM | POA: Diagnosis present

## 2018-07-23 DIAGNOSIS — I10 Essential (primary) hypertension: Secondary | ICD-10-CM | POA: Insufficient documentation

## 2018-07-23 DIAGNOSIS — E039 Hypothyroidism, unspecified: Secondary | ICD-10-CM | POA: Diagnosis not present

## 2018-07-23 DIAGNOSIS — K5900 Constipation, unspecified: Secondary | ICD-10-CM | POA: Diagnosis not present

## 2018-07-23 LAB — CBC WITH DIFFERENTIAL/PLATELET
Abs Immature Granulocytes: 0.02 10*3/uL (ref 0.00–0.07)
BASOS ABS: 0.1 10*3/uL (ref 0.0–0.1)
BASOS PCT: 1 %
EOS ABS: 0.2 10*3/uL (ref 0.0–0.5)
Eosinophils Relative: 3 %
HCT: 36.2 % (ref 36.0–46.0)
Hemoglobin: 11.1 g/dL — ABNORMAL LOW (ref 12.0–15.0)
Immature Granulocytes: 0 %
Lymphocytes Relative: 27 %
Lymphs Abs: 1.6 10*3/uL (ref 0.7–4.0)
MCH: 30 pg (ref 26.0–34.0)
MCHC: 30.7 g/dL (ref 30.0–36.0)
MCV: 97.8 fL (ref 80.0–100.0)
Monocytes Absolute: 0.4 10*3/uL (ref 0.1–1.0)
Monocytes Relative: 7 %
NEUTROS PCT: 62 %
NRBC: 0 % (ref 0.0–0.2)
Neutro Abs: 3.7 10*3/uL (ref 1.7–7.7)
PLATELETS: 284 10*3/uL (ref 150–400)
RBC: 3.7 MIL/uL — AB (ref 3.87–5.11)
RDW: 13 % (ref 11.5–15.5)
WBC: 6 10*3/uL (ref 4.0–10.5)

## 2018-07-23 LAB — COMPREHENSIVE METABOLIC PANEL
ALT: 15 U/L (ref 0–44)
ANION GAP: 10 (ref 5–15)
AST: 21 U/L (ref 15–41)
Albumin: 3.8 g/dL (ref 3.5–5.0)
Alkaline Phosphatase: 43 U/L (ref 38–126)
BILIRUBIN TOTAL: 0.6 mg/dL (ref 0.3–1.2)
BUN: 28 mg/dL — AB (ref 8–23)
CALCIUM: 9.8 mg/dL (ref 8.9–10.3)
CO2: 27 mmol/L (ref 22–32)
Chloride: 103 mmol/L (ref 98–111)
Creatinine, Ser: 1.47 mg/dL — ABNORMAL HIGH (ref 0.44–1.00)
GFR calc Af Amer: 37 mL/min — ABNORMAL LOW (ref >60)
GFR calc non Af Amer: 32 mL/min — ABNORMAL LOW (ref >60)
Glucose, Bld: 99 mg/dL (ref 70–99)
POTASSIUM: 3.7 mmol/L (ref 3.5–5.1)
Sodium: 140 mmol/L (ref 135–145)
TOTAL PROTEIN: 6.9 g/dL (ref 6.5–8.1)

## 2018-07-23 LAB — LIPASE, BLOOD: Lipase: 20 U/L (ref 11–51)

## 2018-07-23 MED ORDER — ACETAMINOPHEN 325 MG PO TABS
650.0000 mg | ORAL_TABLET | Freq: Once | ORAL | Status: AC
  Administered 2018-07-23: 650 mg via ORAL
  Filled 2018-07-23: qty 2

## 2018-07-23 MED ORDER — MILK AND MOLASSES ENEMA
1.0000 | Freq: Once | RECTAL | Status: AC
  Administered 2018-07-23: 250 mL via RECTAL
  Filled 2018-07-23: qty 250

## 2018-07-23 NOTE — ED Triage Notes (Signed)
Patient coming from home. Lives with daughter. Patient has a 3 day hx of constipation. Daughter states their is a big log stuck. Patient c/o pain in rectum. Denies bleeding. Patient c/o of dry mouth and thirst. Patient denies taking opioids.    Patient is oriented at baseline.   Patient ambulates with a cane.  Patient does not qualify for PTAR transportation back home.  Daughter states patient will have a ride back home.   130/62 78-hr 98% RA 18-rr

## 2018-07-23 NOTE — ED Notes (Signed)
Pt given ham sandwich and apple juice

## 2018-07-23 NOTE — ED Notes (Signed)
Pt's grandson called and sts will notify family members that pt is ready to be discharged

## 2018-07-23 NOTE — ED Provider Notes (Addendum)
[facility] DEPT Provider Note   CSN: [id number] Arrival date & time: 07/23/18  1024   History   Chief Complaint Chief Complaint  Patient presents with  . Fecal Impaction    HPI Lisa Sawyer is a 84 y.o. female with past medical history significant for hypertension, hypokalemia, chronic anemia, mild cognitive decline who presents for evaluation of constipation.  Patient states she has not had a bowel movement x3 days.  Patient states she normally has multiple bowel movements a day.  Patient states she has pain to her rectum.  Rates his pain a 6/10. Describes the pain as a pressure and fullness sensation.  Denies rectal bleeding, fever, chills, nausea, vomiting, abdominal pain, dysuria, diarrhea, melena or bright red blood per rectum.  Patient denies taking any narcotic medication.  Patient's daughter states she does have a prior history of constipation, however states it has "been a while, " per EMS.  Per EMS patient's daughter states she is oriented to baseline.  Patient ambulates with a cane and a walker at home.  History obtained from patient and EMS.  No interpreter was used.  HPI  Past Medical History:  Diagnosis Date  . Acute blood loss anemia    secondary to surgery, requiring blood transfusion  . Anemia   . Anxiety attack   . breast ca dx'd 2009   pt states pre ca; lumpectomy-lt w/ xrt  . History of breast cancer   . Hypercholesterolemia   . Hypertension   . Hypokalemia    treated  . Hypothyroidism   . Idiopathic thrombocytopenic purpura (HCC)    A questionable history of idiopathic thrombocytopenic purpura  . Leukocytosis   . Osteoarthritis    left knee  . Pyrexia   . Thrombocytopenia ([place])   . Thyroid disease     Patient Active Problem List   Diagnosis Date Noted  . Central chest pain 10/31/2017  . Hypertensive urgency 10/31/2017  . HTN (hypertension), malignant 05/16/2013  . Headache 05/16/2013  . Hyponatremia 05/16/2013    . Edema 02/21/2012  . Depression with anxiety 12/08/2011  . Shortness of breath 09/30/2011  . Palpitations 09/06/2011  . Anemia   . Thrombocytopenia ([place])   . Malignant neoplasm of female breast ([place]) 02/07/2007  . Hypothyroidism 02/07/2007  . HTN (hypertension), benign 02/07/2007  . [place] DISEASE 02/07/2007    Past Surgical History:  Procedure Laterality Date  . BREAST LUMPECTOMY    . BREAST SURGERY    . CHOLECYSTECTOMY    . JOINT REPLACEMENT    . KNEE RECONSTRUCTION, MEDIAL PATELLAR FEMORAL LIGAMENT    . total knee       OB History   None      Home Medications    Prior to Admission medications   Medication Sig Start Date End Date Taking? Authorizing Provider  amLODipine (NORVASC) 5 MG tablet Take 1 tablet (5 mg total) by mouth daily. 05/15/18   Skeet Latch, MD  Ascorbic Acid (VITAMIN C) 1000 MG tablet Take 1,000 mg by mouth daily.    [provider]  aspirin EC 81 MG tablet Take 81 mg by mouth at bedtime.     [provider]  atorvastatin (LIPITOR) 10 MG tablet Take 1 tablet (10 mg total) by mouth daily. 11/14/17   Barrett, Evelene Croon, PA-C  carvedilol (COREG) 6.25 MG tablet TAKE 1 TABLET BY MOUTH TWICE DAILY WITH MEALS 07/02/18   Skeet Latch, MD  furosemide (LASIX) 40 MG tablet Take 0.5 tablets (  20 mg total) by mouth 3 (three) times a week. 02/05/18   Barrett, Evelene Croon, PA-C  hydrALAZINE (APRESOLINE) 50 MG tablet Take 1 tablet (50 mg total) by mouth 2 (two) times daily. 01/04/18   Skeet Latch, MD  isosorbide mononitrate (IMDUR) 30 MG 24 hr tablet Take 1 tablet (30 mg total) by mouth daily. Patient not taking: Reported on 07/01/2018 05/15/18   Skeet Latch, MD  levothyroxine (SYNTHROID, LEVOTHROID) 100 MCG tablet TAKE 1 TABLET BY MOUTH ONCE DAILY BEFORE BREAKFAST Patient not taking: No sig reported 06/22/18   Tenna Delaine D, PA-C  levothyroxine (SYNTHROID, LEVOTHROID) 112 MCG tablet Take 112 mcg by mouth daily before  breakfast.    [provider]  mometasone (NASONEX) 50 MCG/ACT nasal spray instill 2 sprays into each nostril once daily Patient taking differently: Place 2 sprays into the nose daily as needed (for allergies).  07/24/13   Barton Fanny, MD  nitroGLYCERIN (NITROSTAT) 0.4 MG SL tablet Place 1 tablet (0.4 mg total) under the tongue every 5 (five) minutes as needed for chest pain. 11/02/17   Florencia Reasons, MD    Family History Family History  Problem Relation Age of Onset  . Cancer Mother   . Heart disease Mother   . Heart disease Sister     Social History Social History   Tobacco Use  . Smoking status: Former Research scientist (life sciences)  . Smokeless tobacco: Never Used  Substance Use Topics  . Alcohol use: No  . Drug use: No     Allergies   Naprosyn [naproxen]   Review of Systems Review of Systems  Constitutional: Negative.   Respiratory: Negative.   Cardiovascular: Negative.   Gastrointestinal: Positive for constipation and rectal pain. Negative for abdominal distention, abdominal pain, anal bleeding, blood in stool, diarrhea, nausea and vomiting.  Genitourinary: Negative.   Musculoskeletal: Negative.   Skin: Negative.   Neurological: Negative.   All other systems reviewed and are negative.    Physical Exam Updated Vital Signs BP (!) 162/60 (BP Location: Left Arm)   Pulse (!) 59   Temp 97.9 F (36.6 C) (Oral)   Resp 16   SpO2 100%   Physical Exam  Constitutional: Vital signs are normal. She appears well-developed and well-nourished.  Non-toxic appearance. She does not have a sickly appearance. She does not appear ill. No distress.  HENT:  Head: Atraumatic.  Nose: Nose normal. Right sinus exhibits no maxillary sinus tenderness and no frontal sinus tenderness. Left sinus exhibits no maxillary sinus tenderness and no frontal sinus tenderness.  Mouth/Throat: Uvula is midline, oropharynx is clear and moist and mucous membranes are normal. Mucous membranes are not dry. No  trismus in the jaw. No uvula swelling. No oropharyngeal exudate, posterior oropharyngeal edema, posterior oropharyngeal erythema or tonsillar abscesses. No tonsillar exudate.  Eyes: Pupils are equal, round, and reactive to light.  Neck: Normal range of motion, full passive range of motion without pain and phonation normal. Neck supple. No neck rigidity. No edema, no erythema and normal range of motion present.  Cardiovascular: Normal rate, regular rhythm, normal heart sounds, intact distal pulses and normal pulses. Exam reveals no gallop and no friction rub.  No murmur heard. Pulmonary/Chest: Effort normal and breath sounds normal. No accessory muscle usage or stridor. No tachypnea. No respiratory distress. She has no decreased breath sounds. She has no wheezes. She has no rhonchi. She has no rales. She exhibits no tenderness.  Abdominal: Soft. Normal appearance and bowel sounds are normal. She exhibits no shifting  dullness, no distension, no pulsatile liver, no fluid wave, no abdominal bruit, no ascites, no pulsatile midline mass and no mass. There is no hepatosplenomegaly. There is no tenderness. There is no rigidity, no rebound, no guarding, no CVA tenderness, no tenderness at McBurney's point and negative Murphy's sign. No hernia.  Genitourinary: Rectal exam shows tenderness. Rectal exam shows no external hemorrhoid, no internal hemorrhoid, no fissure, no mass and anal tone normal. Pelvic exam was performed with patient in the knee-chest position. There is no rash, tenderness, lesion or injury on the right labia. There is no rash, tenderness, lesion or injury on the left labia.  Genitourinary Comments: Exam with female chaperone in room.  No evidence of external/internal hemorrhoids.  No evidence of anal fissures.  Patient does have moderate stool impaction.  Stool is light brown in color without any gross melena or bright red blood per rectum.  Stool is soft in rectal vault.  No tenderness to perineum.   No evidence of edema, erythema or warmth to perineum.  No evidence of induration or fluctuance noted.  Patient noted pain with rectal exam secondary to fecal impaction.  Musculoskeletal: Normal range of motion.  Neurological: She is alert. She displays no atrophy and no tremor. No sensory deficit.  Skin: Skin is warm, dry and intact. No abrasion, no bruising, no ecchymosis, no laceration, no lesion and no rash noted. She is not diaphoretic. No erythema.  Psychiatric: She has a normal mood and affect.  Oriented to person place and time.  Nursing note and vitals reviewed.    ED Treatments / Results  Labs (all labs ordered are listed, but only abnormal results are displayed) Labs Reviewed  CBC WITH DIFFERENTIAL/PLATELET - Abnormal; Notable for the following components:      Result Value   RBC 3.70 (*)    Hemoglobin 11.1 (*)    All other components within normal limits  COMPREHENSIVE METABOLIC PANEL - Abnormal; Notable for the following components:   BUN 28 (*)    Creatinine, Ser 1.47 (*)    GFR calc non Af Amer 32 (*)    GFR calc Af Amer 37 (*)    All other components within normal limits  LIPASE, BLOOD    EKG None  Radiology No results found.  Procedures Procedures (including critical care time)  Medications Ordered in ED Medications  milk and molasses enema (250 mLs Rectal Given 07/23/18 1239)  acetaminophen (TYLENOL) tablet 650 mg (650 mg Oral Given 07/23/18 1205)     Initial Impression / Assessment and Plan / ED Course  I have reviewed the triage vital signs and the nursing notes.  Pertinent labs & imaging results that were available during my care of the patient were reviewed by me and considered in my medical decision making (see chart for details).  84 year old female who appears otherwise well presents for evaluation of fecal impaction.  States is been 3 days since she has had a bowel movement.  States she normally has bowel movements approximately 3-4 times a  day.  Does have prior history of constipation, however states "it has been a while." Abdomen nontender without rebound, rigidity or guarding.  Patient is nontoxic, nonseptic appearing, in no apparent distress. Patient does not meet the SIRS or Sepsis criteria.    GU exam with moderate somewhat soft stool in rectal vault.  Stool light brown in color without any gross melena or bright red blood.  Perineum without tenderness, no erythema, edema or warmth.  No fluctuance or  induration to perineum.  Rectal exam without obvious internal/external hemorrhoids or fissures.  I was able to disimpact patient on exam with moderate stool removed, however there is still stool in vault.  No gross blood on exam.  Afebrile, nonseptic, non-ill-appearing.  Patient is oriented to person, place and time.  Patient answers questions appropriately.  Will obtain basic abdominal labs and provide milk of molasses enema and reevaluate.  Clinical Course as of Jul 23 1614  Mon Jul 24, 6843  [id number] 84 year old female here with rectal pain and inability to pass stool.  She had a manual disimpaction here and she feels better.  She is getting some screening labs and I do not feel like she needs imaging at this point.  Likely will return home and get her on a bowel regimen so she can continue to improve.   [MB]    Clinical Course User Index [MB] Hayden Rasmussen, MD   Patient with relief of rectal pain with manual disimpaction and with enema. Nursing note states moderate size stool. Patient with no complaints at this time. Patient tolerating PO intake in department without difficulty. Labs without Leukocytosis. Hemoglobin at 11, which review of past medical records is at patients baseline. Creatinine 1.47, last check 1.34 and 1.75 previously. Looking at patient past records PCP has been trying to balance renal function and volume overload with patients  SOB. Her labs today are at baseline for patient. Lipase 20.   On reevaluation abdomen  non tender, without rebound, guarding or rigidity. Patient asymptomatic at this time. Do not feel patient needs imagine at this time.  On repeat exam patient does not have a surgical abdomin and there are no peritoneal signs.  No indication of appendicitis, bowel obstruction, bowel perforation, cholecystitis, diverticulitis.  Patient is hemodynamically stable and appropriate for DC home at this time.  She has no active complaints.  Will DC home with bowel regimen as well as strict return precautions.  Patients daughter will be coming to pick up patient.  Will discuss return precautions with patient's daughter.  Patient voiced understanding and is agreeable for follow-up.   Nursing has advised me that we have tried to contact patients Daughter multiple times for discharge with the numbers when have on file. Unable to reach. Will continue to try. Patient is stable for dc home once a ride has been found.   Care has been discussed with Tivis Ringer PA-C who is aware patient dc pending ride home.   Patient has been seen and personally evaluated by my attending Dr. Melina Copa who agrees with above treatment, plan and disposition.  Final Clinical Impressions(s) / ED Diagnoses   Final diagnoses:  Constipation, unspecified constipation type    ED Discharge Orders    None       Oriel Rumbold A, PA-C 07/23/18 1513    Davon Folta A, PA-C 07/23/18 1615    Hayden Rasmussen, MD 07/23/18 1743

## 2018-07-23 NOTE — ED Notes (Signed)
RN has attempted to call patient's daughter multiple times and has left a HIPAA compliant voicemail with no return call

## 2018-07-23 NOTE — Discharge Instructions (Addendum)
You were evaluated today for constipation. You were able to have a large bowel movement in the ED. You may take Miralax which is over the counter for constipation. You may take as needed.  Please follow up with your PCP for reevaluation.  Return to the Ed with any new or worsening symptoms such as:  Get help right away if: You have a fever and your symptoms suddenly get worse. You leak stool or have blood in your stool. Your abdomen is bloated. You have severe pain in your abdomen. You feel dizzy or you faint.

## 2018-07-23 NOTE — ED Notes (Signed)
RN preformed an Enema with NT present. Pt was able to pass 4 large pieces of stool and reports relief after Enema completion.  Pt rates her pain at a 4/10.

## 2018-07-23 NOTE — ED Notes (Signed)
Bed: WA08 Expected date:  Expected time:  Means of arrival:  Comments: EMS 84yo constipation

## 2018-07-23 NOTE — ED Notes (Signed)
Bed: WHALD Expected date:  Expected time:  Means of arrival:  Comments: 

## 2019-03-04 IMAGING — CT CT HEAD W/O CM
3 series · 15 of 47 positions shown, 18 images · non-contrast
Comparison: 05/16/2013

CLINICAL DATA: Altered level of consciousness.

EXAM:
CT HEAD WITHOUT CONTRAST
TECHNIQUE: Contiguous axial images were obtained from the base of the skull
through the vertex without intravenous contrast.

[Series 2: head wo · axial · 0.47mm/px · z∈[-163,-38]mm · 9 of 31 slices shown, 12 images]
[im 3/31  brain]
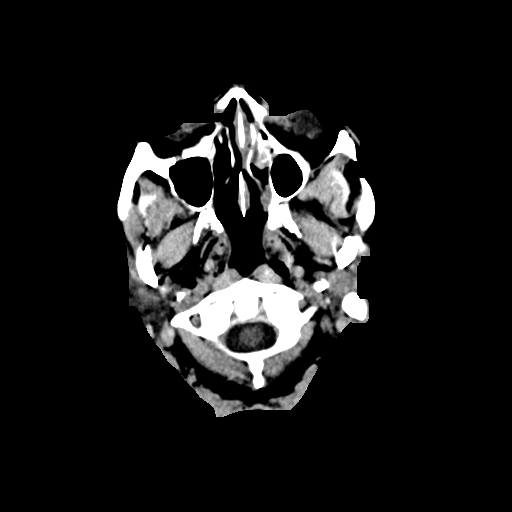
[im 3/31  bone]
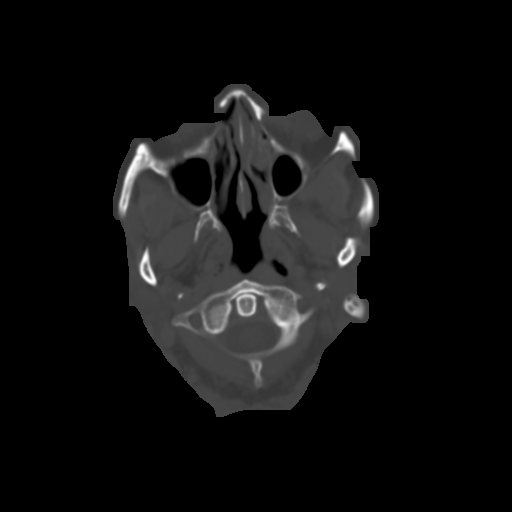
[im 6/31  brain]
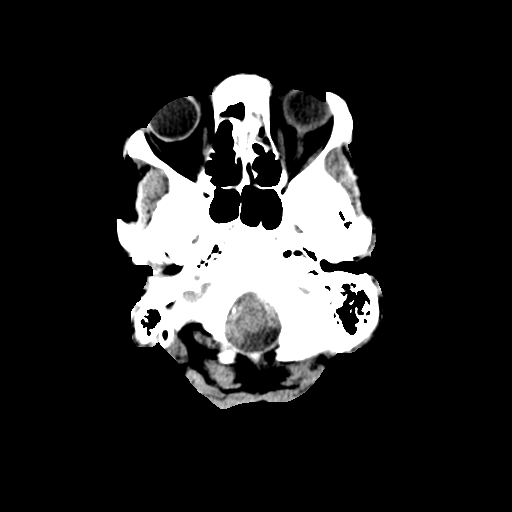
[im 9/31  brain]
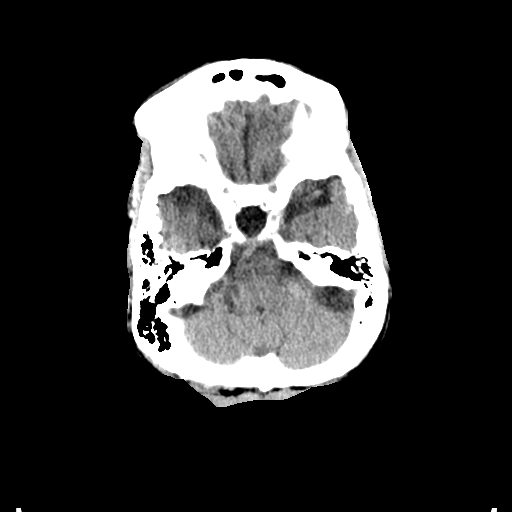
[im 12/31  brain]
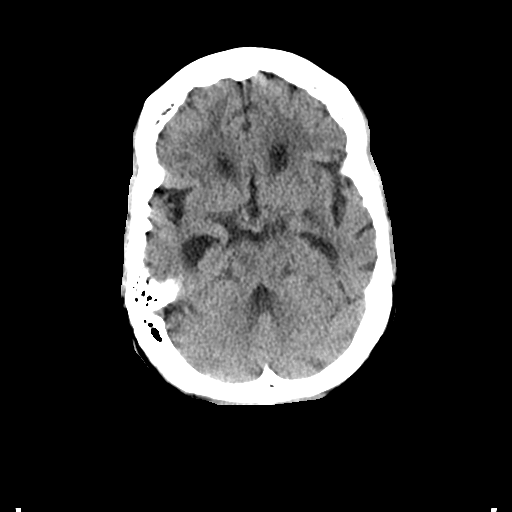
[im 16/31  brain]
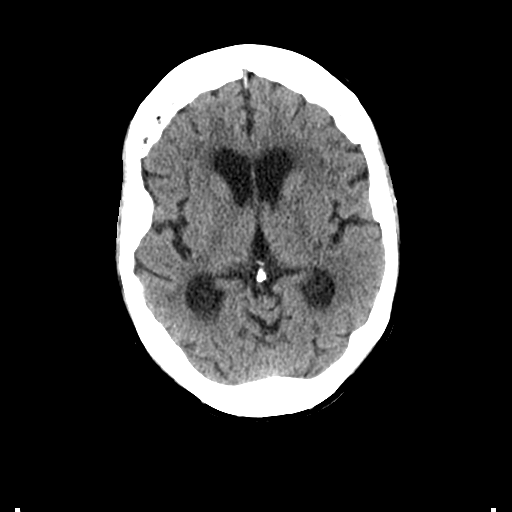
[im 16/31  bone]
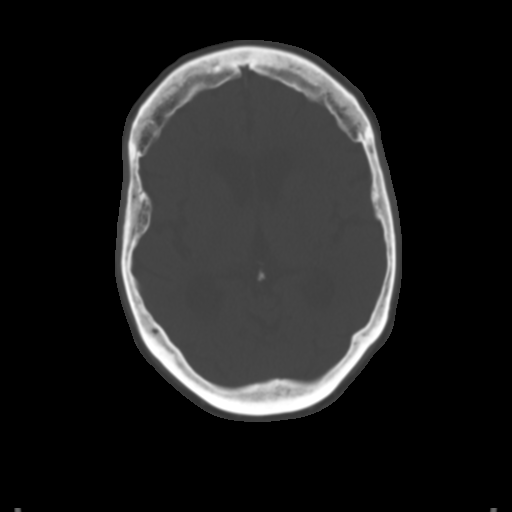
[im 19/31  brain]
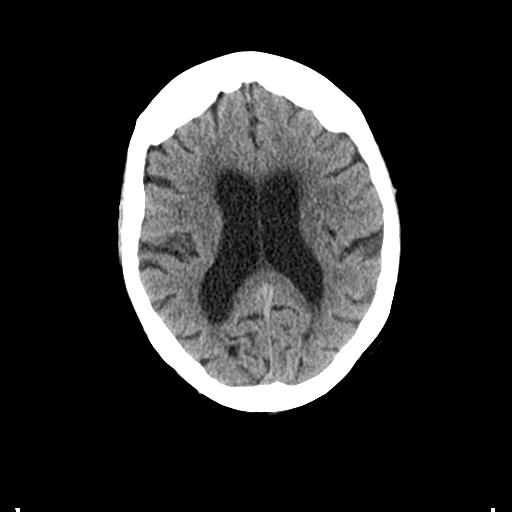
[im 22/31  brain]
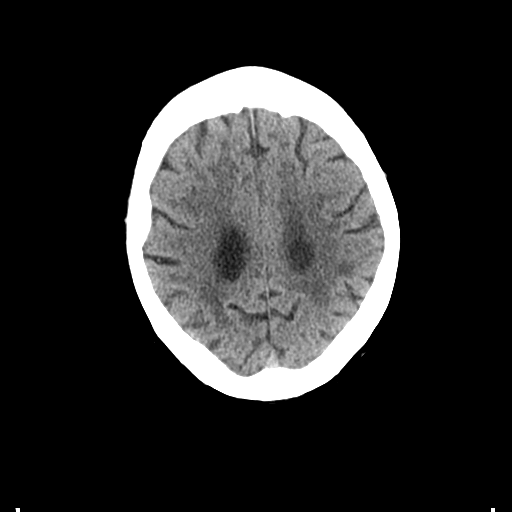
[im 25/31  brain]
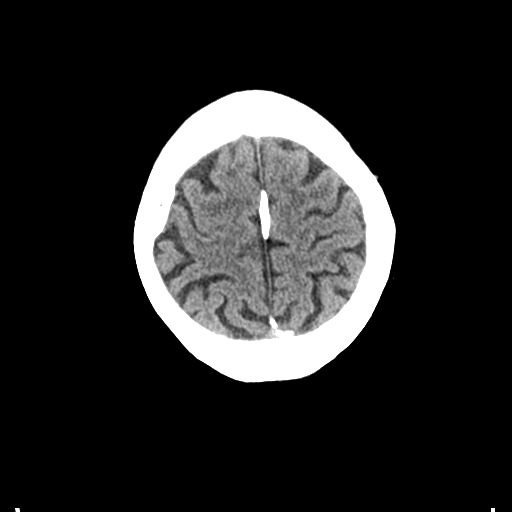
[im 28/31  brain]
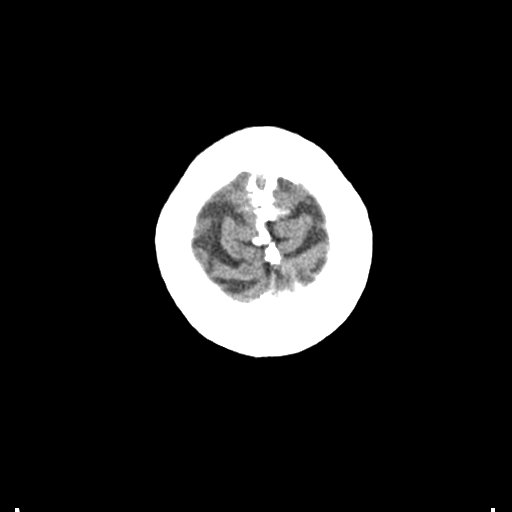
[im 28/31  bone]
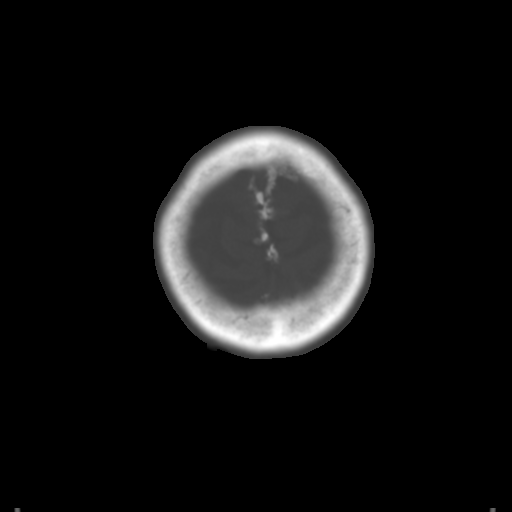

[Series 4: coronal soft tissue · coronal · 0.34mm/px · 3 of 64 slices shown]
[im 22/64  brain]
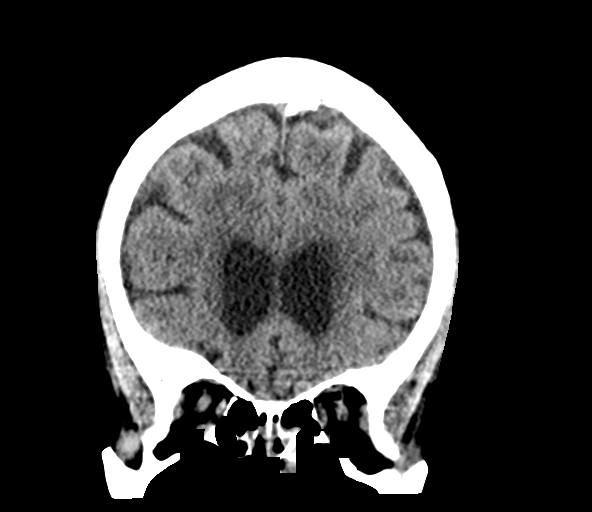
[im 29/64  brain]
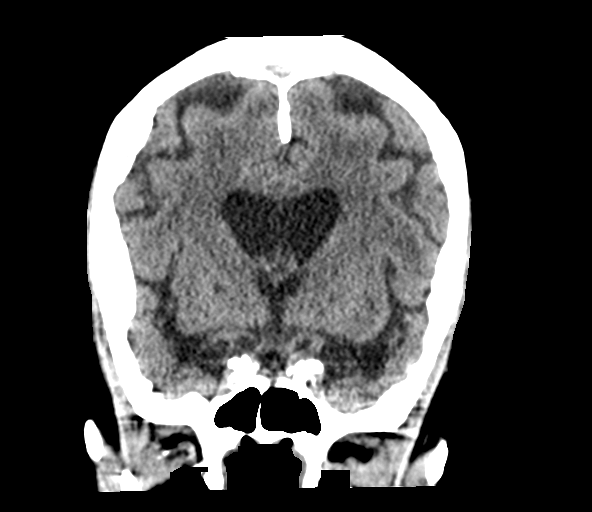
[im 36/64  brain]
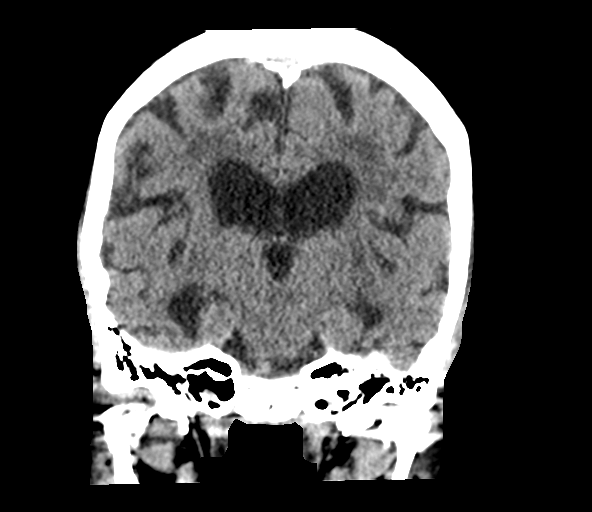

[Series 5: sagittal soft tissue · sagittal · 0.33mm/px · 3 of 53 slices shown]
[im 18/53  brain]
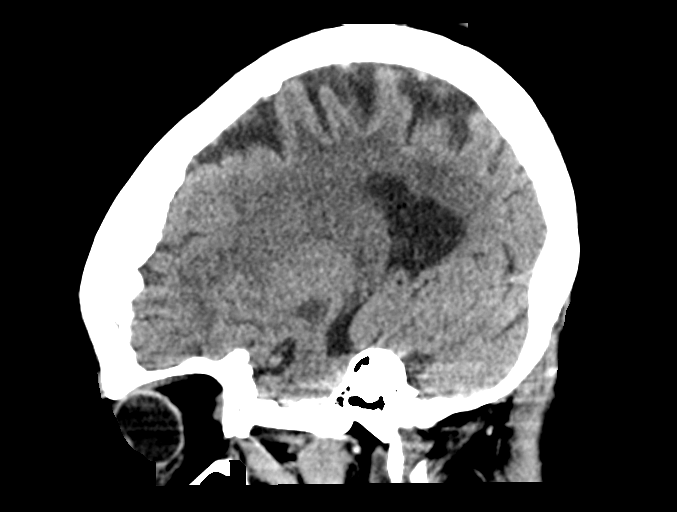
[im 27/53  brain]
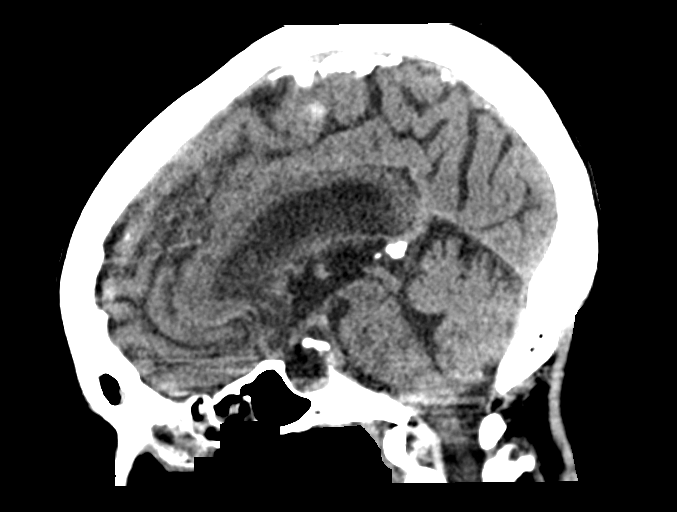
[im 35/53  brain]
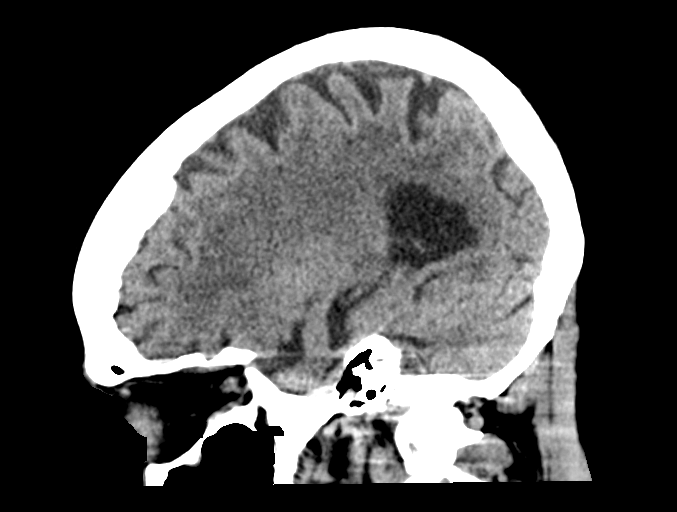

[15 of 47 positions shown; findings below may reference images not displayed]

FINDINGS: Brain: No evidence of acute infarction, hemorrhage, hydrocephalus,
extra-axial collection or mass lesion/mass effect. There is mild
diffuse low-attenuation within the subcortical and periventricular
white matter compatible with chronic microvascular disease.

Vascular: No hyperdense vessel or unexpected calcification.

Skull: Normal. Negative for fracture or focal lesion.

Sinuses/Orbits: The mastoid air cells are clear. Mucosal thickening
and partial opacification of the ethmoid air cells and frontal sinus
noted.

Other: None.
IMPRESSION: 1. No acute intracranial abnormalities.
2. Chronic small vessel ischemic change.
3. Ethmoid air cell and frontal sinus opacification.

## 2020-01-28 DEATH — deceased
